# Patient Record
Sex: Male | Born: 1955 | Hispanic: No | Smoking: Never smoker
Health system: Southern US, Community
[De-identification: ages and names within clinical notes are randomized; demographics above are authoritative.]

## PROBLEM LIST (undated history)

## (undated) DIAGNOSIS — F1010 Alcohol abuse, uncomplicated: Secondary | ICD-10-CM

## (undated) DIAGNOSIS — E119 Type 2 diabetes mellitus without complications: Secondary | ICD-10-CM

## (undated) DIAGNOSIS — I10 Essential (primary) hypertension: Secondary | ICD-10-CM

## (undated) DIAGNOSIS — M869 Osteomyelitis, unspecified: Secondary | ICD-10-CM

## (undated) HISTORY — PX: OTHER SURGICAL HISTORY: SHX169

---

## 2000-02-24 ENCOUNTER — Emergency Department (HOSPITAL_COMMUNITY): Admission: EM | Admit: 2000-02-24 | Discharge: 2000-02-25 | Payer: Self-pay | Admitting: Emergency Medicine

## 2000-02-24 ENCOUNTER — Encounter: Payer: Self-pay | Admitting: Emergency Medicine

## 2001-03-07 ENCOUNTER — Encounter: Payer: Self-pay | Admitting: Emergency Medicine

## 2001-03-07 ENCOUNTER — Emergency Department (HOSPITAL_COMMUNITY): Admission: EM | Admit: 2001-03-07 | Discharge: 2001-03-07 | Payer: Self-pay | Admitting: Emergency Medicine

## 2002-07-11 ENCOUNTER — Emergency Department (HOSPITAL_COMMUNITY): Admission: EM | Admit: 2002-07-11 | Discharge: 2002-07-11 | Payer: Self-pay

## 2002-07-11 ENCOUNTER — Encounter: Payer: Self-pay | Admitting: Emergency Medicine

## 2016-08-22 DIAGNOSIS — M869 Osteomyelitis, unspecified: Secondary | ICD-10-CM

## 2016-08-22 DIAGNOSIS — E11621 Type 2 diabetes mellitus with foot ulcer: Secondary | ICD-10-CM

## 2016-08-22 DIAGNOSIS — I96 Gangrene, not elsewhere classified: Secondary | ICD-10-CM

## 2016-08-22 DIAGNOSIS — L03115 Cellulitis of right lower limb: Secondary | ICD-10-CM

## 2016-08-22 DIAGNOSIS — M79671 Pain in right foot: Secondary | ICD-10-CM

## 2016-08-22 DIAGNOSIS — E118 Type 2 diabetes mellitus with unspecified complications: Secondary | ICD-10-CM

## 2016-08-23 DIAGNOSIS — E11621 Type 2 diabetes mellitus with foot ulcer: Secondary | ICD-10-CM

## 2016-08-23 DIAGNOSIS — M869 Osteomyelitis, unspecified: Secondary | ICD-10-CM

## 2016-08-24 DIAGNOSIS — L97502 Non-pressure chronic ulcer of other part of unspecified foot with fat layer exposed: Secondary | ICD-10-CM

## 2016-08-24 DIAGNOSIS — I10 Essential (primary) hypertension: Secondary | ICD-10-CM

## 2016-08-24 DIAGNOSIS — L03115 Cellulitis of right lower limb: Secondary | ICD-10-CM

## 2016-08-26 DIAGNOSIS — M869 Osteomyelitis, unspecified: Secondary | ICD-10-CM

## 2016-09-02 ENCOUNTER — Encounter: Payer: Self-pay | Admitting: Sports Medicine

## 2016-09-08 ENCOUNTER — Ambulatory Visit: Payer: Self-pay | Admitting: Sports Medicine

## 2016-09-27 ENCOUNTER — Emergency Department (HOSPITAL_COMMUNITY)
Admission: EM | Admit: 2016-09-27 | Discharge: 2016-09-27 | Disposition: A | Discharge status: Home / Self Care | Payer: Self-pay | Attending: Emergency Medicine | Admitting: Emergency Medicine

## 2016-09-27 ENCOUNTER — Emergency Department (HOSPITAL_COMMUNITY): Payer: Self-pay

## 2016-09-27 DIAGNOSIS — E1165 Type 2 diabetes mellitus with hyperglycemia: Secondary | ICD-10-CM | POA: Insufficient documentation

## 2016-09-27 DIAGNOSIS — Z9114 Patient's other noncompliance with medication regimen: Secondary | ICD-10-CM | POA: Insufficient documentation

## 2016-09-27 DIAGNOSIS — Z760 Encounter for issue of repeat prescription: Secondary | ICD-10-CM | POA: Insufficient documentation

## 2016-09-27 DIAGNOSIS — T8130XA Disruption of wound, unspecified, initial encounter: Secondary | ICD-10-CM | POA: Insufficient documentation

## 2016-09-27 DIAGNOSIS — Y658 Other specified misadventures during surgical and medical care: Secondary | ICD-10-CM | POA: Insufficient documentation

## 2016-09-27 LAB — COMPREHENSIVE METABOLIC PANEL
ALBUMIN: 3.6 g/dL (ref 3.5–5.0)
ALK PHOS: 118 U/L (ref 38–126)
ALT: 19 U/L (ref 17–63)
AST: 18 U/L (ref 15–41)
Anion gap: 9 (ref 5–15)
BILIRUBIN TOTAL: 0.7 mg/dL (ref 0.3–1.2)
BUN: 14 mg/dL (ref 6–20)
CALCIUM: 8.9 mg/dL (ref 8.9–10.3)
CO2: 26 mmol/L (ref 22–32)
CREATININE: 0.91 mg/dL (ref 0.61–1.24)
Chloride: 98 mmol/L — ABNORMAL LOW (ref 101–111)
GFR calc Af Amer: >60 mL/min (ref >60)
GFR calc non Af Amer: >60 mL/min (ref >60)
GLUCOSE: 478 mg/dL — AB (ref 65–99)
Potassium: 4.4 mmol/L (ref 3.5–5.1)
Sodium: 133 mmol/L — ABNORMAL LOW (ref 135–145)
TOTAL PROTEIN: 7 g/dL (ref 6.5–8.1)

## 2016-09-27 LAB — CBC WITH DIFFERENTIAL/PLATELET
BASOS ABS: 0 10*3/uL (ref 0.0–0.1)
BASOS PCT: 1 %
EOS PCT: 5 %
Eosinophils Absolute: 0.3 10*3/uL (ref 0.0–0.7)
HCT: 40 % (ref 39.0–52.0)
Hemoglobin: 14.3 g/dL (ref 13.0–17.0)
Lymphocytes Relative: 26 %
Lymphs Abs: 1.4 10*3/uL (ref 0.7–4.0)
MCH: 30.7 pg (ref 26.0–34.0)
MCHC: 35.8 g/dL (ref 30.0–36.0)
MCV: 85.8 fL (ref 78.0–100.0)
MONO ABS: 0.4 10*3/uL (ref 0.1–1.0)
Monocytes Relative: 6 %
Neutro Abs: 3.5 10*3/uL (ref 1.7–7.7)
Neutrophils Relative %: 62 %
PLATELETS: 203 10*3/uL (ref 150–400)
RBC: 4.66 MIL/uL (ref 4.22–5.81)
RDW: 12.1 % (ref 11.5–15.5)
WBC: 5.6 10*3/uL (ref 4.0–10.5)

## 2016-09-27 LAB — I-STAT CG4 LACTIC ACID, ED: Lactic Acid, Venous: 2 mmol/L (ref 0.5–1.9)

## 2016-09-27 LAB — CBG MONITORING, ED
GLUCOSE-CAPILLARY: 151 mg/dL — AB (ref 65–99)
Glucose-Capillary: 474 mg/dL — ABNORMAL HIGH (ref 65–99)

## 2016-09-27 MED ORDER — MORPHINE SULFATE (PF) 4 MG/ML IV SOLN
4.0000 mg | Freq: Once | INTRAVENOUS | Status: AC
  Administered 2016-09-27: 4 mg via INTRAVENOUS
  Filled 2016-09-27: qty 1

## 2016-09-27 MED ORDER — SULFAMETHOXAZOLE-TRIMETHOPRIM 800-160 MG PO TABS
1.0000 | ORAL_TABLET | Freq: Once | ORAL | Status: AC
Start: 2016-09-27 — End: 2016-09-27
  Administered 2016-09-27: 1 via ORAL
  Filled 2016-09-27: qty 1

## 2016-09-27 MED ORDER — SULFAMETHOXAZOLE-TRIMETHOPRIM 800-160 MG PO TABS: 1.0000 | ORAL_TABLET | Freq: Two times a day (BID) | ORAL | 0 refills | Status: AC

## 2016-09-27 MED ORDER — INSULIN ASPART 100 UNIT/ML ~~LOC~~ SOLN
10.0000 [IU] | Freq: Once | SUBCUTANEOUS | Status: AC
  Administered 2016-09-27: 10 [IU] via INTRAVENOUS
  Filled 2016-09-27: qty 1

## 2016-09-27 MED ORDER — ONDANSETRON HCL 4 MG/2ML IJ SOLN
4.0000 mg | Freq: Once | INTRAMUSCULAR | Status: AC
  Administered 2016-09-27: 4 mg via INTRAVENOUS
  Filled 2016-09-27: qty 2

## 2016-09-27 MED ORDER — SODIUM CHLORIDE 0.9 % IV BOLUS (SEPSIS)
1000.0000 mL | Freq: Once | INTRAVENOUS | Status: AC
  Administered 2016-09-27: 1000 mL via INTRAVENOUS

## 2016-09-27 MED ORDER — IBUPROFEN 600 MG PO TABS: 600.0000 mg | ORAL_TABLET | Freq: Four times a day (QID) | ORAL | 0 refills | Status: DC | PRN

## 2016-09-27 MED ORDER — METFORMIN HCL 1000 MG PO TABS: 1000.0000 mg | ORAL_TABLET | Freq: Two times a day (BID) | ORAL | 0 refills | Status: DC

## 2016-09-27 NOTE — ED Notes (Signed)
CBG 474 in triage.

## 2016-09-27 NOTE — ED Triage Notes (Signed)
Patient states he had surgery his right foot around July 1st this year. He thinks it may be infected. He states he was unable to return for his post op appointment due to the expense.

## 2016-09-27 NOTE — ED Provider Notes (Signed)
[facility]-EMERGENCY DEPT Provider Note   CSN: [id number] Arrival date & time: 09/27/16  1246     History   Chief Complaint Chief Complaint  Patient presents with  . Post-op Problem    HPI Ryan Mcbride is a 61 y.o. male.  Pt presents to the ED today with right foot pain.  The pt had surgery at [facility] on July 1st to his right foot.  He does not know who did the surgery or why.  He does not have insurance, so could not afford the copay due to the expense.  He said that he's had some redness and drainage from his foot for the past few days.  The pt denies any f/c.  He does have a hx of diabetes and reports that his blood sugars have been elevated because he does not have his diabetic medication.  He said he does not live in [place] any more b/c he was kicked out of his apartment b/c he could not pay the rent.  He has not been able to work since the surgery.  He is living with his daughter here.        No past medical history on file. Dm 2 There are no active problems to display for this patient.   No past surgical history on file.  right foot I and d with removal of bone (08/23/16) by Dr. Marylene Land. 3rd toe amputation  Home Medications    Prior to Admission medications   Medication Sig Start Date End Date Taking? Authorizing Provider  amoxicillin-clavulanate (AUGMENTIN) 875-125 MG tablet Take 1 tablet by mouth 2 (two) times daily.    [provider]  ibuprofen (ADVIL,MOTRIN) 600 MG tablet Take 1 tablet (600 mg total) by mouth every 6 (six) hours as needed. 09/27/16   Jacalyn Lefevre, MD  metFORMIN (GLUCOPHAGE) 1000 MG tablet Take 1 tablet (1,000 mg total) by mouth 2 (two) times daily with a meal. 09/27/16   Jacalyn Lefevre, MD  sulfamethoxazole-trimethoprim (BACTRIM DS,SEPTRA DS) 800-160 MG tablet Take 1 tablet by mouth 2 (two) times daily. 09/27/16 10/04/16  Jacalyn Lefevre, MD    Family History No family history on file.  Social History Social History  Substance Use  Topics  . Smoking status: Not on file  . Smokeless tobacco: Not on file  . Alcohol use Not on file     Allergies   Patient has no allergy information on record.   Review of Systems Review of Systems  Musculoskeletal:       Right foot pain  Skin: Positive for wound.  All other systems reviewed and are negative.    Physical Exam Updated Vital Signs There were no vitals taken for this visit.  Physical Exam  Constitutional: He is oriented to person, place, and time. He appears well-developed and well-nourished.  HENT:  Head: Normocephalic and atraumatic.  Right Ear: External ear normal.  Left Ear: External ear normal.  Nose: Nose normal.  Mouth/Throat: Oropharynx is clear and moist.  Eyes: Pupils are equal, round, and reactive to light. Conjunctivae and EOM are normal.  Neck: Normal range of motion. Neck supple.  Cardiovascular: Normal rate, regular rhythm, normal heart sounds and intact distal pulses.   Pulmonary/Chest: Effort normal and breath sounds normal.  Abdominal: Soft. Bowel sounds are normal.  Musculoskeletal: Normal range of motion.  Neurological: He is alert and oriented to person, place, and time.  Skin:  See picture of right foot (below)  Nursing note and vitals reviewed.    ED  Treatments / Results  Labs (all labs ordered are listed, but only abnormal results are displayed) Labs Reviewed  COMPREHENSIVE METABOLIC PANEL - Abnormal; Notable for the following:       Result Value   Sodium 133 (*)    Chloride 98 (*)    Glucose, Bld 478 (*)    All other components within normal limits  CBG MONITORING, ED - Abnormal; Notable for the following:    Glucose-Capillary 474 (*)    All other components within normal limits  I-STAT CG4 LACTIC ACID, ED - Abnormal; Notable for the following:    Lactic Acid, Venous 2.00 (*)    All other components within normal limits  CBG MONITORING, ED - Abnormal; Notable for the following:    Glucose-Capillary 151 (*)    All  other components within normal limits  CBC WITH DIFFERENTIAL/PLATELET    EKG  EKG Interpretation None       Radiology Dg Foot Complete Right  Result Date: 09/27/2016 CLINICAL DATA:  Open wound along the medial side of the right forefoot. Recent surgery involving the first MTP joint on 08/22/2016. EXAM: RIGHT FOOT COMPLETE - 3+ VIEW COMPARISON:  None. FINDINGS: Evidence of recent amputation involving distal half of the first metatarsal. Exuberant heterotopic ossification noted. No obvious destructive bony changes to suggest osteomyelitis. The sesamoid bones of the great toe appear to be intact. Severe chronic appearing posttraumatic or post infectious changes involving the second and third metatarsals and second proximal phalanx. The third toe has been previously amputated. No obvious acute destructive bony changes are identified. There is a large calcaneal heel spur noted along with os trigonum and extensive small vessel calcifications. IMPRESSION: 1. Surgical changes involving the first metatarsal as discussed above. There is exuberant heterotopic ossification but no obvious plain film findings for osteomyelitis. 2. Remote posttraumatic or post infectious changes involving the second proximal phalanx and second metatarsal and distal third metatarsal. The third toe has been amputated. 3. Open wound along the medial aspect of the forefoot and lateral forefoot soft tissue swelling. Electronically Signed   By: Rudie Meyer M.D.   On: 09/27/2016 14:21    Procedures Procedures (including critical care time)  Medications Ordered in ED Medications  sulfamethoxazole-trimethoprim (BACTRIM DS,SEPTRA DS) 800-160 MG per tablet 1 tablet (not administered)  sodium chloride 0.9 % bolus 1,000 mL (1,000 mLs Intravenous New Bag/Given 09/27/16 1439)  morphine 4 MG/ML injection 4 mg (4 mg Intravenous Given 09/27/16 1439)  ondansetron (ZOFRAN) injection 4 mg (4 mg Intravenous Given 09/27/16 1439)  insulin aspart  (novoLOG) injection 10 Units (10 Units Intravenous Given 09/27/16 1439)     Initial Impression / Assessment and Plan / ED Course  I have reviewed the triage vital signs and the nursing notes.  Pertinent labs & imaging results that were available during my care of the patient were reviewed by me and considered in my medical decision making (see chart for details).  Records from [facility] reviewed.  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  Notes said Dr. Marylene Land did have clean margins from osteo on amputation.     The wound appears to have some dehiscence with some mild drainage.  No cellulitis.  No fevers.  No elevation in lactic or wbc.    Pt is stable for d/c.  He is encouraged to f/u with Dr. Marylene Land.  He has been on augmentin, so I will put him on bactrim.  He is also given the number to [facility] to f/u with diabetic care.  He knows to return if worse. Final Clinical Impressions(s) / ED Diagnoses   Final diagnoses:  Wound dehiscence  Poorly controlled type 2 diabetes mellitus (HCC)  Medication refill    New Prescriptions New Prescriptions   IBUPROFEN (ADVIL,MOTRIN) 600 MG TABLET    Take 1 tablet (600 mg total) by mouth every 6 (six) hours as needed.   SULFAMETHOXAZOLE-TRIMETHOPRIM (BACTRIM DS,SEPTRA DS) 800-160 MG TABLET    Take 1 tablet by mouth 2 (two) times daily.     Jacalyn LefevreHaviland, Lakyla Biswas, MD 09/27/16 [phone]

## 2016-12-04 DIAGNOSIS — E1165 Type 2 diabetes mellitus with hyperglycemia: Secondary | ICD-10-CM

## 2016-12-04 DIAGNOSIS — E11621 Type 2 diabetes mellitus with foot ulcer: Secondary | ICD-10-CM

## 2017-04-05 DIAGNOSIS — M869 Osteomyelitis, unspecified: Secondary | ICD-10-CM

## 2017-04-05 DIAGNOSIS — E1165 Type 2 diabetes mellitus with hyperglycemia: Secondary | ICD-10-CM

## 2017-04-05 DIAGNOSIS — L03115 Cellulitis of right lower limb: Secondary | ICD-10-CM

## 2017-04-06 DIAGNOSIS — E11628 Type 2 diabetes mellitus with other skin complications: Secondary | ICD-10-CM

## 2017-04-06 DIAGNOSIS — L03115 Cellulitis of right lower limb: Secondary | ICD-10-CM

## 2017-04-06 DIAGNOSIS — E11621 Type 2 diabetes mellitus with foot ulcer: Secondary | ICD-10-CM

## 2017-04-06 DIAGNOSIS — E1165 Type 2 diabetes mellitus with hyperglycemia: Secondary | ICD-10-CM

## 2017-04-07 DIAGNOSIS — M86671 Other chronic osteomyelitis, right ankle and foot: Secondary | ICD-10-CM

## 2017-04-07 DIAGNOSIS — E11621 Type 2 diabetes mellitus with foot ulcer: Secondary | ICD-10-CM

## 2017-04-07 DIAGNOSIS — L03115 Cellulitis of right lower limb: Secondary | ICD-10-CM

## 2017-04-08 ENCOUNTER — Encounter: Payer: Self-pay | Admitting: Sports Medicine

## 2017-04-22 ENCOUNTER — Ambulatory Visit (INDEPENDENT_AMBULATORY_CARE_PROVIDER_SITE_OTHER): Payer: Self-pay

## 2017-04-22 ENCOUNTER — Ambulatory Visit (INDEPENDENT_AMBULATORY_CARE_PROVIDER_SITE_OTHER): Payer: Self-pay | Admitting: Sports Medicine

## 2017-04-22 ENCOUNTER — Encounter: Payer: Self-pay | Admitting: Sports Medicine

## 2017-04-22 VITALS — BP 134/85 | HR 69 | Ht 65.0 in | Wt 145.0 lb

## 2017-04-22 DIAGNOSIS — E1142 Type 2 diabetes mellitus with diabetic polyneuropathy: Secondary | ICD-10-CM

## 2017-04-22 DIAGNOSIS — Z89421 Acquired absence of other right toe(s): Secondary | ICD-10-CM

## 2017-04-22 NOTE — Progress Notes (Signed)
Subjective: Ryan Mcbride is a 62 y.o. Diabetic male patient seen today in office for POV #1 (DOS 04-07-17), S/P Right 5th toe + distal metatarsal amputation secondary to osteomyelitis. Patient denies pain at surgical site, denies calf pain, denies headache, chest pain, shortness of breath, nausea, vomiting, fever, or chills. Patient states that he is doing well and is also following up at the wound care center every Friday. No other issues noted.   Patient is assisted by daughter who he lives with now in [place].  There are no active problems to display for this patient.   Current Outpatient Medications on File Prior to Visit  Medication Sig Dispense Refill  . amoxicillin-clavulanate (AUGMENTIN) 875-125 MG tablet Take 1 tablet by mouth 2 (two) times daily.    Marland Kitchen glyBURIDE (DIABETA) 5 MG tablet Take 5 mg by mouth daily with breakfast.    . lisinopril (PRINIVIL,ZESTRIL) 20 MG tablet Take 20 mg by mouth daily.    . metFORMIN (GLUCOPHAGE) 1000 MG tablet Take 1 tablet (1,000 mg total) by mouth 2 (two) times daily with a meal. 60 tablet 0  . ibuprofen (ADVIL,MOTRIN) 600 MG tablet Take 1 tablet (600 mg total) by mouth every 6 (six) hours as needed. (Patient not taking: Reported on 04/22/2017) 30 tablet 0   No current facility-administered medications on file prior to visit.     No Known Allergies  Objective: Vitals:   04/22/17 1003  Weight: 145 lb (65.8 kg)  Height: 5\' 5"  (1.651 m)    General: No acute distress, AAOx3  Right foot: Sutures intact at the proximal aspect of the amputation site with no gapping or dehiscence at surgical site, there is an open amputation wound that is completely granular that measures 3 cm x 2.6 cm and tunnels at the proximal aspect of the wound 0.5 cm all with granular tissue no deep tissue or deep structures exposed, mild bloody drainage, no erythema, no warmth,  no signs of infection noted, Capillary fill time <3 seconds in all mania digits, gross sensation present via  light touch to right foot however protective sensation absent.  No pain or crepitation with range of motion right foot.  Patient is status post right fifth toe plus distal metatarsal amputation and previous removal of bone at the first metatarsal due to history of osteomyelitis.  No pain with calf compression.   Post Op Xray, right foot consistent with postoperative status and chronic reactive bone changes due to history of previous osteomyelitis and previous surgery  Assessment and Plan:  Problem List Items Addressed This Visit    None    Visit Diagnoses    Status post amputation of lesser toe of right foot (HCC)    -  Primary   Relevant Orders   DG Foot Complete Right   Diabetic polyneuropathy associated with type 2 diabetes mellitus (HCC)       Relevant Medications   lisinopril (PRINIVIL,ZESTRIL) 20 MG tablet   glyBURIDE (DIABETA) 5 MG tablet        -Patient seen and evaluated -X-rays reviewed -[employer] and a small amount of iodoform packing dry sterile dressing to surgical site right foot secured with ACE wrap and stockinet  -Advised patient to make sure to keep dressings clean, dry, and intact to right foot continue with follow-up weekly at wound care center and assistance from daughter with changing the dressings -Advised patient to continue with post-op shoe oon right -Advised patient to limit activity to necessity  -Will plan for wound  check at next office visit. In the meantime, patient to call office if any issues or problems arise.   Asencion Islamitorya Minna Dumire, DPM

## 2017-05-27 ENCOUNTER — Ambulatory Visit: Payer: Self-pay | Admitting: Sports Medicine

## 2017-06-17 ENCOUNTER — Encounter: Payer: Self-pay | Admitting: Sports Medicine

## 2017-06-17 ENCOUNTER — Ambulatory Visit (INDEPENDENT_AMBULATORY_CARE_PROVIDER_SITE_OTHER): Payer: Self-pay | Admitting: Sports Medicine

## 2017-06-17 ENCOUNTER — Encounter: Payer: Self-pay | Admitting: *Deleted

## 2017-06-17 DIAGNOSIS — E1142 Type 2 diabetes mellitus with diabetic polyneuropathy: Secondary | ICD-10-CM

## 2017-06-17 DIAGNOSIS — Z89421 Acquired absence of other right toe(s): Secondary | ICD-10-CM

## 2017-06-17 NOTE — Progress Notes (Signed)
Subjective: Ryan Mcbride is a 62 y.o. Diabetic male patient seen today in office for POV #2 (DOS 04-07-17), S/P Right 5th toe + distal metatarsal amputation secondary to osteomyelitis. Patient denies pain at surgical site, was discharged from wound care center on Friday because the wound had healed, denies calf pain, denies headache, chest pain, shortness of breath, nausea, vomiting, fever, or chills.  No other issues noted.   Patient is living with daughter in [place].  There are no active problems to display for this patient.   Current Outpatient Medications on File Prior to Visit  Medication Sig Dispense Refill  . amoxicillin-clavulanate (AUGMENTIN) 875-125 MG tablet Take 1 tablet by mouth 2 (two) times daily.    Marland Kitchen. ibuprofen (ADVIL,MOTRIN) 600 MG tablet Take 1 tablet (600 mg total) by mouth every 6 (six) hours as needed. 30 tablet 0  . lisinopril (PRINIVIL,ZESTRIL) 20 MG tablet Take 20 mg by mouth daily.     No current facility-administered medications on file prior to visit.     No Known Allergies  Objective: There were no vitals filed for this visit.  General: No acute distress, AAOx3  Right foot: Amputation site wound healed, no erythema, no warmth,  no signs of infection noted, Capillary fill time <3 seconds in all remaining digits, gross sensation present via light touch to right foot however protective sensation absent.  No pain or crepitation with range of motion right foot.  Patient is status post right fifth toe plus distal metatarsal amputation and previous removal of bone at the first metatarsal due to history of osteomyelitis.  No pain with calf compression.  Assessment and Plan:  Problem List Items Addressed This Visit    None    Visit Diagnoses    Status post amputation of lesser toe of right foot (HCC)    -  Primary   Diabetic polyneuropathy associated with type 2 diabetes mellitus (HCC)            -Patient seen and evaluated -Recommend protective Band-Aid when issue  to prevent rubbing and re-ulceration -Patient may use good supportive tennis shoe -Work note given to return to work with no restrictions -Patient is healed and is discharged from postoperative care.  Return as needed and in the meantime, patient to call office if any issues or problems arise.   Ryan Mcbride, DPM

## 2017-06-18 ENCOUNTER — Emergency Department (HOSPITAL_COMMUNITY): Payer: Self-pay

## 2017-06-18 ENCOUNTER — Encounter (HOSPITAL_COMMUNITY): Payer: Self-pay | Admitting: Emergency Medicine

## 2017-06-18 ENCOUNTER — Inpatient Hospital Stay (HOSPITAL_COMMUNITY)
Admission: EM | Admit: 2017-06-18 | Discharge: 2017-06-22 | DRG: 637 | Disposition: A | Discharge status: Home / Self Care | Payer: Self-pay | Attending: Internal Medicine | Admitting: Internal Medicine

## 2017-06-18 ENCOUNTER — Other Ambulatory Visit: Payer: Self-pay

## 2017-06-18 DIAGNOSIS — J9601 Acute respiratory failure with hypoxia: Secondary | ICD-10-CM | POA: Diagnosis present

## 2017-06-18 DIAGNOSIS — D696 Thrombocytopenia, unspecified: Secondary | ICD-10-CM | POA: Diagnosis present

## 2017-06-18 DIAGNOSIS — J9690 Respiratory failure, unspecified, unspecified whether with hypoxia or hypercapnia: Secondary | ICD-10-CM

## 2017-06-18 DIAGNOSIS — I959 Hypotension, unspecified: Secondary | ICD-10-CM | POA: Diagnosis not present

## 2017-06-18 DIAGNOSIS — G92 Toxic encephalopathy: Secondary | ICD-10-CM | POA: Diagnosis present

## 2017-06-18 DIAGNOSIS — R569 Unspecified convulsions: Secondary | ICD-10-CM

## 2017-06-18 DIAGNOSIS — I10 Essential (primary) hypertension: Secondary | ICD-10-CM | POA: Diagnosis present

## 2017-06-18 DIAGNOSIS — G4089 Other seizures: Secondary | ICD-10-CM | POA: Diagnosis present

## 2017-06-18 DIAGNOSIS — Z978 Presence of other specified devices: Secondary | ICD-10-CM

## 2017-06-18 DIAGNOSIS — E1110 Type 2 diabetes mellitus with ketoacidosis without coma: Principal | ICD-10-CM | POA: Diagnosis present

## 2017-06-18 DIAGNOSIS — Z9114 Patient's other noncompliance with medication regimen: Secondary | ICD-10-CM

## 2017-06-18 DIAGNOSIS — Z79899 Other long term (current) drug therapy: Secondary | ICD-10-CM

## 2017-06-18 DIAGNOSIS — E876 Hypokalemia: Secondary | ICD-10-CM | POA: Diagnosis present

## 2017-06-18 DIAGNOSIS — N179 Acute kidney failure, unspecified: Secondary | ICD-10-CM | POA: Diagnosis present

## 2017-06-18 DIAGNOSIS — E1010 Type 1 diabetes mellitus with ketoacidosis without coma: Secondary | ICD-10-CM

## 2017-06-18 LAB — CBG MONITORING, ED
Glucose-Capillary: 373 mg/dL — ABNORMAL HIGH (ref 65–99)
Glucose-Capillary: 377 mg/dL — ABNORMAL HIGH (ref 65–99)
Glucose-Capillary: 438 mg/dL — ABNORMAL HIGH (ref 65–99)

## 2017-06-18 LAB — CBC WITH DIFFERENTIAL/PLATELET
BASOS ABS: 0.1 10*3/uL (ref 0.0–0.1)
BASOS PCT: 1 %
EOS ABS: 0.1 10*3/uL (ref 0.0–0.7)
Eosinophils Relative: 1 %
HCT: 44.6 % (ref 39.0–52.0)
Hemoglobin: 15.4 g/dL (ref 13.0–17.0)
LYMPHS PCT: 38 %
Lymphs Abs: 4.1 10*3/uL — ABNORMAL HIGH (ref 0.7–4.0)
MCH: 29.5 pg (ref 26.0–34.0)
MCHC: 34.5 g/dL (ref 30.0–36.0)
MCV: 85.4 fL (ref 78.0–100.0)
MONO ABS: 0.9 10*3/uL (ref 0.1–1.0)
Monocytes Relative: 8 %
NEUTROS PCT: 52 %
Neutro Abs: 5.7 10*3/uL (ref 1.7–7.7)
PLATELETS: 225 10*3/uL (ref 150–400)
RBC: 5.22 MIL/uL (ref 4.22–5.81)
RDW: 12.7 % (ref 11.5–15.5)
WBC: 10.9 10*3/uL — AB (ref 4.0–10.5)

## 2017-06-18 LAB — COMPREHENSIVE METABOLIC PANEL
ALT: <5 U/L — ABNORMAL LOW (ref 17–63)
ANION GAP: 25 — AB (ref 5–15)
AST: 36 U/L (ref 15–41)
Albumin: 4 g/dL (ref 3.5–5.0)
Alkaline Phosphatase: 154 U/L — ABNORMAL HIGH (ref 38–126)
BILIRUBIN TOTAL: 0.7 mg/dL (ref 0.3–1.2)
BUN: 17 mg/dL (ref 6–20)
CHLORIDE: 99 mmol/L — AB (ref 101–111)
CO2: 9 mmol/L — ABNORMAL LOW (ref 22–32)
Calcium: 9.3 mg/dL (ref 8.9–10.3)
Creatinine, Ser: 1.32 mg/dL — ABNORMAL HIGH (ref 0.61–1.24)
GFR calc non Af Amer: 56 mL/min — ABNORMAL LOW (ref >60)
Glucose, Bld: 535 mg/dL (ref 65–99)
Potassium: 3.8 mmol/L (ref 3.5–5.1)
Sodium: 133 mmol/L — ABNORMAL LOW (ref 135–145)
TOTAL PROTEIN: 7.5 g/dL (ref 6.5–8.1)

## 2017-06-18 LAB — I-STAT VENOUS BLOOD GAS, ED
ACID-BASE DEFICIT: 20 mmol/L — AB (ref 0.0–2.0)
BICARBONATE: 9 mmol/L — AB (ref 20.0–28.0)
O2 SAT: 96 %
TCO2: 10 mmol/L — AB (ref 22–32)
pCO2, Ven: 29.7 mmHg — ABNORMAL LOW (ref 44.0–60.0)
pH, Ven: 7.091 — CL (ref 7.250–7.430)
pO2, Ven: 113 mmHg — ABNORMAL HIGH (ref 32.0–45.0)

## 2017-06-18 LAB — LIPASE, BLOOD: LIPASE: 29 U/L (ref 11–51)

## 2017-06-18 LAB — ETHANOL

## 2017-06-18 MED ORDER — LORAZEPAM 2 MG/ML IJ SOLN
2.0000 mg | Freq: Once | INTRAMUSCULAR | Status: AC
  Administered 2017-06-18: 2 mg via INTRAVENOUS

## 2017-06-18 MED ORDER — SODIUM CHLORIDE 0.9 % IV SOLN
INTRAVENOUS | Status: DC
  Administered 2017-06-19: 03:00:00 via INTRAVENOUS

## 2017-06-18 MED ORDER — DEXTROSE-NACL 5-0.45 % IV SOLN: INTRAVENOUS | Status: DC

## 2017-06-18 MED ORDER — SODIUM CHLORIDE 0.9 % IV SOLN
INTRAVENOUS | Status: DC
  Administered 2017-06-18: 3.8 [IU]/h via INTRAVENOUS
  Filled 2017-06-18: qty 1

## 2017-06-18 MED ORDER — DEXMEDETOMIDINE HCL IN NACL 400 MCG/100ML IV SOLN
0.4000 ug/kg/h | INTRAVENOUS | Status: DC
  Administered 2017-06-19 (×3): 1 ug/kg/h via INTRAVENOUS
  Administered 2017-06-19: 0.8 ug/kg/h via INTRAVENOUS
  Administered 2017-06-20: 1.2 ug/kg/h via INTRAVENOUS
  Filled 2017-06-18 (×5): qty 100

## 2017-06-18 MED ORDER — POTASSIUM CHLORIDE 10 MEQ/100ML IV SOLN
10.0000 meq | INTRAVENOUS | Status: AC
  Administered 2017-06-19 (×2): 10 meq via INTRAVENOUS
  Filled 2017-06-18 (×2): qty 100

## 2017-06-18 MED ORDER — LORAZEPAM 2 MG/ML IJ SOLN
INTRAMUSCULAR | Status: AC
  Administered 2017-06-18: 2 mg
  Filled 2017-06-18: qty 1

## 2017-06-18 MED ORDER — LORAZEPAM 2 MG/ML IJ SOLN
INTRAMUSCULAR | Status: AC
  Filled 2017-06-18: qty 1

## 2017-06-18 MED ORDER — SODIUM CHLORIDE 0.9 % IV SOLN: INTRAVENOUS | Status: DC

## 2017-06-18 MED ORDER — DEXMEDETOMIDINE HCL IN NACL 200 MCG/50ML IV SOLN
0.4000 ug/kg/h | INTRAVENOUS | Status: DC
Start: 2017-06-18 — End: 2017-06-18

## 2017-06-18 MED ORDER — SODIUM CHLORIDE 0.9 % IV BOLUS
3000.0000 mL | Freq: Once | INTRAVENOUS | Status: AC
  Administered 2017-06-19: 1000 mL via INTRAVENOUS

## 2017-06-18 MED ORDER — LEVETIRACETAM IN NACL 500 MG/100ML IV SOLN
500.0000 mg | Freq: Two times a day (BID) | INTRAVENOUS | Status: DC
  Administered 2017-06-18 – 2017-06-19 (×2): 500 mg via INTRAVENOUS
  Filled 2017-06-18 (×3): qty 100

## 2017-06-18 MED ORDER — INSULIN REGULAR HUMAN 100 UNIT/ML IJ SOLN
INTRAMUSCULAR | Status: DC
  Administered 2017-06-18: 9.4 [IU]/h via INTRAVENOUS

## 2017-06-18 MED ORDER — DEXTROSE-NACL 5-0.45 % IV SOLN
INTRAVENOUS | Status: DC
  Administered 2017-06-19: 01:00:00 via INTRAVENOUS

## 2017-06-18 MED ORDER — HEPARIN SODIUM (PORCINE) 5000 UNIT/ML IJ SOLN
5000.0000 [IU] | Freq: Three times a day (TID) | INTRAMUSCULAR | Status: DC
  Administered 2017-06-19 – 2017-06-22 (×8): 5000 [IU] via SUBCUTANEOUS
  Filled 2017-06-18 (×11): qty 1

## 2017-06-18 MED ORDER — SODIUM CHLORIDE 0.9 % IV BOLUS
1000.0000 mL | Freq: Once | INTRAVENOUS | Status: AC
  Administered 2017-06-18: 1000 mL via INTRAVENOUS

## 2017-06-18 MED ORDER — LORAZEPAM 2 MG/ML IJ SOLN: 2.0000 mg | INTRAMUSCULAR | Status: DC | PRN

## 2017-06-18 NOTE — ED Triage Notes (Signed)
Patient with seizures, has had 3 in the last hour, two in the presence of EMS.  Patient is incontinent of urine.  Patient's pupil are unequal and CBG of 535.  Patient arrived postictal and feels warm to touch.

## 2017-06-18 NOTE — ED Notes (Signed)
Patient resting quietly; will continue to monitor.

## 2017-06-18 NOTE — ED Notes (Signed)
Patient with tonic clonic movements in stretcher.  Dr Jeraldine Loots notified.  Med orders per Dr Jeraldine Loots.

## 2017-06-18 NOTE — ED Provider Notes (Addendum)
[facility] EMERGENCY DEPARTMENT Provider Note   CSN: [id number] Arrival date & time: 06/18/17  1902     History   Chief Complaint Chief Complaint  Patient presents with  . Seizures    HPI Ryan Mcbride is a 62 y.o. male.  HPI Patient presents after seizure. Patient is just finishing a seizure episode on my initial exam, has received Ativan, level 5 caveat secondary to mental status. Per report patient has no history of seizures, does have a history of diabetic issues. Reportedly the patient had one witnessed seizure at home, 2 in route with EMS, and to immediately after arrival to our facility.  Patient did not receive antiepileptics in route. No report of fall, trauma.   History reviewed. No pertinent past medical history.  There are no active problems to display for this patient.   History reviewed. No pertinent surgical history.      Home Medications    Prior to Admission medications   Medication Sig Start Date End Date Taking? Authorizing Provider  ibuprofen (ADVIL,MOTRIN) 600 MG tablet Take 1 tablet (600 mg total) by mouth every 6 (six) hours as needed. 09/27/16   Jacalyn Lefevre, MD  lisinopril (PRINIVIL,ZESTRIL) 20 MG tablet Take 20 mg by mouth daily.    [provider]    Family History No family history on file.  Social History Social History   Tobacco Use  . Smoking status: Never Smoker  . Smokeless tobacco: Never Used  Substance Use Topics  . Alcohol use: Not on file  . Drug use: Not on file     Allergies   Patient has no known allergies.   Review of Systems Review of Systems  Unable to perform ROS: Mental status change     Physical Exam Updated Vital Signs BP (!) 141/83 (BP Location: Right Arm)   Pulse (!) 103   Temp 98.5 F (36.9 C) (Rectal)   Resp (!) 25   Wt 65.8 kg (145 lb)   SpO2 98%   BMI 24.13 kg/m   Physical Exam  Constitutional: He appears well-developed. No distress.  HENT:  Head:  Normocephalic and atraumatic.  Eyes: Conjunctivae and EOM are normal.  Cardiovascular: Normal rate and regular rhythm.  Pulmonary/Chest: Effort normal. No stridor. No respiratory distress.  Abdominal: He exhibits no distension.  Musculoskeletal: He exhibits no edema.       Feet:  Neurological:  Postictal, minimally interactive  Skin: Skin is warm and dry.  Nursing note and vitals reviewed.    ED Treatments / Results  Labs (all labs ordered are listed, but only abnormal results are displayed) Labs Reviewed  COMPREHENSIVE METABOLIC PANEL - Abnormal; Notable for the following components:      Result Value   Sodium 133 (*)    Chloride 99 (*)    CO2 9 (*)    Glucose, Bld 535 (*)    Creatinine, Ser 1.32 (*)    ALT <5 (*)    Alkaline Phosphatase 154 (*)    GFR calc non Af Amer 56 (*)    Anion gap 25 (*)    All other components within normal limits  CBC WITH DIFFERENTIAL/PLATELET - Abnormal; Notable for the following components:   WBC 10.9 (*)    Lymphs Abs 4.1 (*)    All other components within normal limits  I-STAT VENOUS BLOOD GAS, ED - Abnormal; Notable for the following components:   pH, Ven 7.091 (*)    pCO2, Ven 29.7 (*)  pO2, Ven 113.0 (*)    Bicarbonate 9.0 (*)    TCO2 10 (*)    Acid-base deficit 20.0 (*)    All other components within normal limits  ETHANOL  LIPASE, BLOOD  CBG MONITORING, ED     Radiology Ct Head Wo Contrast  Result Date: 06/18/2017 CLINICAL DATA:  Seizures.  Incontinent of urine. EXAM: CT HEAD WITHOUT CONTRAST TECHNIQUE: Contiguous axial images were obtained from the base of the skull through the vertex without intravenous contrast. COMPARISON:  None. FINDINGS: Brain: Ventricles, cisterns and other CSF spaces are within normal. There is no mass, mass effect, shift of midline structures or acute hemorrhage. No evidence of acute infarction. Vascular: No hyperdense vessel or unexpected calcification. Skull: Possible subtle fracture along the  right-sided nasal bone which may be acute or chronic. Sinuses/Orbits: Minimal mucosal membrane thickening over the floor the left maxillary sinus. Orbits are normal. Mild opacification over the inferior right mastoid air cells. Other: None. IMPRESSION: No acute intracranial findings. Subtle fracture along the right-sided nasal bone which may be acute or chronic. Minimal chronic sinus inflammatory change. Electronically Signed   By: Elberta Fortis M.D.   On: 06/18/2017 20:35    Procedures Procedures (including critical care time)  Medications Ordered in ED Medications  dextrose 5 %-0.45 % sodium chloride infusion (has no administration in time range)  insulin regular (NOVOLIN R,HUMULIN R) 100 Units in sodium chloride 0.9 % 100 mL (1 Units/mL) infusion (has no administration in time range)  LORazepam (ATIVAN) 2 MG/ML injection (has no administration in time range)  LORazepam (ATIVAN) injection 2 mg (2 mg Intravenous Given 06/18/17 1925)  sodium chloride 0.9 % bolus 1,000 mL (1,000 mLs Intravenous New Bag/Given 06/18/17 1943)     Initial Impression / Assessment and Plan / ED Course  I have reviewed the triage vital signs and the nursing notes.  Pertinent labs & imaging results that were available during my care of the patient were reviewed by me and considered in my medical decision making (see chart for details).     Update: Patient calm, initial labs notable for hyperglycemia. Patient does have a history of poorly controlled diabetes.  8:55 PM Patient agitated, combative, not calming easily. No additional seizure activity, but the patient will require Ativan for his safety.  11:30 PM No additional seizure activity, but the patient is required additional boluses of Ativan for agitation. Glucose is diminishing. Discussed patient's case with our critical care colleagues for admission to the ICU.  Patient has received initial fluid resuscitation, but given concern for DKA with anion gap of  25, acidosis with a pH of 7.091, patient is starting insulin drip, will require admission to the stepdown unit for his DKA and new seizure activity.  No other clear etiology for his seizures, patient seemingly denies alcohol use, when he is interacting in a coma manner, though this is inconsistent.   Final Clinical Impressions(s) / ED Diagnoses  DKA Seizure  CRITICAL CARE Performed by: Gerhard Munch Total critical care time: 25 minutes Critical care time was exclusive of separately billable procedures and treating other patients. Critical care was necessary to treat or prevent imminent or life-threatening deterioration. Critical care was time spent personally by me on the following activities: development of treatment plan with patient and/or surrogate as well as nursing, discussions with consultants, evaluation of patient's response to treatment, examination of patient, obtaining history from patient or surrogate, ordering and performing treatments and interventions, ordering and review of laboratory studies, ordering and  review of radiographic studies, pulse oximetry and re-evaluation of patient's condition.    Gerhard Munch, MD 06/18/17 2332  Patient remains agitated in spite of multiple doses of Ativan, and per critical care request, will be intubated prior to transfer to the ICU.   INTUBATION Performed by: Gerhard Munch  Required items: required blood products, implants, devices, and special equipment available Patient identity confirmed: provided demographic data and hospital-assigned identification number Time out: Immediately prior to procedure a "time out" was called to verify the correct patient, procedure, equipment, support staff and site/side marked as required.  Indications: agitation, airway protection  Intubation method: Glidescope Laryngoscopy   Preoxygenation: BVM  Sedatives: 20Etomidate Paralytic: 100Succinylcholine  Tube Size: 7.5  cuffed  Post-procedure assessment: chest rise and ETCO2 monitor Breath sounds: equal and absent over the epigastrium Tube secured with: ETT holder Chest x-ray interpreted by radiologist and me.  Chest x-ray findings: endotracheal tube in appropriate position  Patient tolerated the procedure well with no immediate complications.       Gerhard Munch, MD 06/19/17 [phone]

## 2017-06-18 NOTE — ED Notes (Signed)
Patient awoke, confused, pulling lines and tubes; attempting to crawl out of bed. MD Jeraldine Loots at bedside.

## 2017-06-18 NOTE — ED Notes (Signed)
Date and time results received: 06/18/17 2016 (use smartphrase ".now" to insert current time)  Test: Glucose Critical Value: 535  Name of Provider Notified: Inetta Fermo, RN  Orders Received? Or Actions Taken?: Nothing at this time

## 2017-06-19 ENCOUNTER — Inpatient Hospital Stay (HOSPITAL_COMMUNITY): Payer: Self-pay

## 2017-06-19 ENCOUNTER — Other Ambulatory Visit: Payer: Self-pay

## 2017-06-19 DIAGNOSIS — R569 Unspecified convulsions: Secondary | ICD-10-CM

## 2017-06-19 DIAGNOSIS — R451 Restlessness and agitation: Secondary | ICD-10-CM

## 2017-06-19 DIAGNOSIS — E1010 Type 1 diabetes mellitus with ketoacidosis without coma: Secondary | ICD-10-CM

## 2017-06-19 DIAGNOSIS — J9600 Acute respiratory failure, unspecified whether with hypoxia or hypercapnia: Secondary | ICD-10-CM

## 2017-06-19 LAB — BASIC METABOLIC PANEL
Anion gap: 12 (ref 5–15)
Anion gap: 7 (ref 5–15)
Anion gap: 6 (ref 5–15)
BUN: 12 mg/dL (ref 6–20)
BUN: 13 mg/dL (ref 6–20)
BUN: 16 mg/dL (ref 6–20)
CHLORIDE: 105 mmol/L (ref 101–111)
CHLORIDE: 110 mmol/L (ref 101–111)
CHLORIDE: 112 mmol/L — AB (ref 101–111)
CO2: 21 mmol/L — ABNORMAL LOW (ref 22–32)
CO2: 22 mmol/L (ref 22–32)
CO2: 23 mmol/L (ref 22–32)
CREATININE: 0.78 mg/dL (ref 0.61–1.24)
Calcium: 9 mg/dL (ref 8.9–10.3)
Calcium: 7.8 mg/dL — ABNORMAL LOW (ref 8.9–10.3)
Calcium: 7.8 mg/dL — ABNORMAL LOW (ref 8.9–10.3)
Creatinine, Ser: 1.02 mg/dL (ref 0.61–1.24)
Creatinine, Ser: 0.8 mg/dL (ref 0.61–1.24)
GFR calc Af Amer: >60 mL/min (ref >60)
GFR calc Af Amer: >60 mL/min (ref >60)
GFR calc Af Amer: >60 mL/min (ref >60)
GFR calc non Af Amer: >60 mL/min (ref >60)
GFR calc non Af Amer: >60 mL/min (ref >60)
GFR calc non Af Amer: >60 mL/min (ref >60)
GLUCOSE: 113 mg/dL — AB (ref 65–99)
GLUCOSE: 289 mg/dL — AB (ref 65–99)
Glucose, Bld: 109 mg/dL — ABNORMAL HIGH (ref 65–99)
POTASSIUM: 3.9 mmol/L (ref 3.5–5.1)
POTASSIUM: 4.3 mmol/L (ref 3.5–5.1)
Potassium: 4.2 mmol/L (ref 3.5–5.1)
SODIUM: 139 mmol/L (ref 135–145)
Sodium: 138 mmol/L (ref 135–145)
Sodium: 141 mmol/L (ref 135–145)

## 2017-06-19 LAB — RAPID URINE DRUG SCREEN, HOSP PERFORMED
AMPHETAMINES: NOT DETECTED
BENZODIAZEPINES: POSITIVE — AB
Barbiturates: NOT DETECTED
COCAINE: NOT DETECTED
Opiates: NOT DETECTED
Tetrahydrocannabinol: NOT DETECTED

## 2017-06-19 LAB — GLUCOSE, CAPILLARY
GLUCOSE-CAPILLARY: 114 mg/dL — AB (ref 65–99)
GLUCOSE-CAPILLARY: 228 mg/dL — AB (ref 65–99)
GLUCOSE-CAPILLARY: 256 mg/dL — AB (ref 65–99)
GLUCOSE-CAPILLARY: 157 mg/dL — AB (ref 65–99)
GLUCOSE-CAPILLARY: 73 mg/dL (ref 65–99)
Glucose-Capillary: 93 mg/dL (ref 65–99)
Glucose-Capillary: 103 mg/dL — ABNORMAL HIGH (ref 65–99)
Glucose-Capillary: 127 mg/dL — ABNORMAL HIGH (ref 65–99)

## 2017-06-19 LAB — HEMOGLOBIN A1C
Hgb A1c MFr Bld: 10 % — ABNORMAL HIGH (ref 4.8–5.6)
MEAN PLASMA GLUCOSE: 240.3 mg/dL

## 2017-06-19 LAB — I-STAT ARTERIAL BLOOD GAS, ED
Acid-base deficit: 5 mmol/L — ABNORMAL HIGH (ref 0.0–2.0)
BICARBONATE: 20.9 mmol/L (ref 20.0–28.0)
O2 SAT: 100 %
PCO2 ART: 41.9 mmHg (ref 32.0–48.0)
PO2 ART: 412 mmHg — AB (ref 83.0–108.0)
Patient temperature: 98.6
TCO2: 22 mmol/L (ref 22–32)
pH, Arterial: 7.306 — ABNORMAL LOW (ref 7.350–7.450)

## 2017-06-19 LAB — PROCALCITONIN: PROCALCITONIN: 0.11 ng/mL

## 2017-06-19 LAB — URINALYSIS, ROUTINE W REFLEX MICROSCOPIC
BACTERIA UA: NONE SEEN
Bilirubin Urine: NEGATIVE
Hgb urine dipstick: NEGATIVE
KETONES UR: 5 mg/dL — AB
LEUKOCYTES UA: NEGATIVE
Nitrite: NEGATIVE
PROTEIN: NEGATIVE mg/dL
Specific Gravity, Urine: 1.026 (ref 1.005–1.030)
pH: 6 (ref 5.0–8.0)

## 2017-06-19 LAB — MRSA PCR SCREENING: MRSA BY PCR: NEGATIVE

## 2017-06-19 LAB — LACTIC ACID, PLASMA
LACTIC ACID, VENOUS: 2.9 mmol/L — AB (ref 0.5–1.9)
Lactic Acid, Venous: 2.5 mmol/L (ref 0.5–1.9)
Lactic Acid, Venous: 1.3 mmol/L (ref 0.5–1.9)

## 2017-06-19 LAB — HIV ANTIBODY (ROUTINE TESTING W REFLEX): HIV Screen 4th Generation wRfx: NONREACTIVE

## 2017-06-19 LAB — MAGNESIUM: Magnesium: 2.3 mg/dL (ref 1.7–2.4)

## 2017-06-19 LAB — TROPONIN I: Troponin I: <0.03 ng/mL (ref <0.03)

## 2017-06-19 LAB — CK: CK TOTAL: 178 U/L (ref 49–397)

## 2017-06-19 LAB — CBG MONITORING, ED: GLUCOSE-CAPILLARY: 205 mg/dL — AB (ref 65–99)

## 2017-06-19 LAB — PHOSPHORUS: Phosphorus: 3 mg/dL (ref 2.5–4.6)

## 2017-06-19 LAB — TRIGLYCERIDES
TRIGLYCERIDES: 1320 mg/dL — AB (ref <150)
TRIGLYCERIDES: 131 mg/dL (ref <150)

## 2017-06-19 LAB — BETA-HYDROXYBUTYRIC ACID: Beta-Hydroxybutyric Acid: 0.55 mmol/L — ABNORMAL HIGH (ref 0.05–0.27)

## 2017-06-19 MED ORDER — LEVETIRACETAM IN NACL 500 MG/100ML IV SOLN
500.0000 mg | Freq: Two times a day (BID) | INTRAVENOUS | Status: DC
  Administered 2017-06-19 – 2017-06-21 (×4): 500 mg via INTRAVENOUS
  Filled 2017-06-19 (×4): qty 100

## 2017-06-19 MED ORDER — LEVETIRACETAM IN NACL 1000 MG/100ML IV SOLN
1000.0000 mg | Freq: Two times a day (BID) | INTRAVENOUS | Status: DC
  Filled 2017-06-19: qty 100

## 2017-06-19 MED ORDER — FENTANYL BOLUS VIA INFUSION
50.0000 ug | INTRAVENOUS | Status: DC | PRN
  Administered 2017-06-20: 50 ug via INTRAVENOUS
  Filled 2017-06-19: qty 50

## 2017-06-19 MED ORDER — ETOMIDATE 2 MG/ML IV SOLN
INTRAVENOUS | Status: AC | PRN
  Administered 2017-06-19: 20 mg via INTRAVENOUS

## 2017-06-19 MED ORDER — PHENYLEPHRINE HCL 10 MG/ML IJ SOLN
0.0000 ug/min | INTRAMUSCULAR | Status: DC
  Administered 2017-06-19: 40 ug/min via INTRAVENOUS
  Filled 2017-06-19: qty 10

## 2017-06-19 MED ORDER — FENTANYL CITRATE (PF) 100 MCG/2ML IJ SOLN
50.0000 ug | Freq: Once | INTRAMUSCULAR | Status: AC
  Administered 2017-06-19: 50 ug via INTRAVENOUS

## 2017-06-19 MED ORDER — SODIUM CHLORIDE 0.9 % IV SOLN
INTRAVENOUS | Status: DC
  Administered 2017-06-19 – 2017-06-20 (×2): via INTRAVENOUS

## 2017-06-19 MED ORDER — PROPOFOL 1000 MG/100ML IV EMUL
0.0000 ug/kg/min | INTRAVENOUS | Status: DC
  Administered 2017-06-19 (×2): 50 ug/kg/min via INTRAVENOUS
  Administered 2017-06-19 (×2): 40 ug/kg/min via INTRAVENOUS
  Filled 2017-06-19 (×4): qty 100

## 2017-06-19 MED ORDER — DEXTROSE 50 % IV SOLN
INTRAVENOUS | Status: AC
  Administered 2017-06-19: 50 mL
  Filled 2017-06-19: qty 50

## 2017-06-19 MED ORDER — LACTATED RINGERS IV BOLUS
1000.0000 mL | Freq: Once | INTRAVENOUS | Status: AC
  Administered 2017-06-19: 1000 mL via INTRAVENOUS

## 2017-06-19 MED ORDER — LORAZEPAM 2 MG/ML IJ SOLN
2.0000 mg | Freq: Once | INTRAMUSCULAR | Status: AC
  Administered 2017-06-19: 2 mg via INTRAVENOUS

## 2017-06-19 MED ORDER — FOLIC ACID 5 MG/ML IJ SOLN
1.0000 mg | Freq: Every day | INTRAMUSCULAR | Status: DC
  Administered 2017-06-19: 1 mg via INTRAVENOUS
  Filled 2017-06-19 (×2): qty 0.2

## 2017-06-19 MED ORDER — CHLORHEXIDINE GLUCONATE 0.12% ORAL RINSE (MEDLINE KIT)
15.0000 mL | Freq: Two times a day (BID) | OROMUCOSAL | Status: DC
  Administered 2017-06-19 – 2017-06-20 (×3): 15 mL via OROMUCOSAL

## 2017-06-19 MED ORDER — LEVETIRACETAM IN NACL 500 MG/100ML IV SOLN: 500.0000 mg | Freq: Two times a day (BID) | INTRAVENOUS | Status: DC

## 2017-06-19 MED ORDER — ORAL CARE MOUTH RINSE
15.0000 mL | OROMUCOSAL | Status: DC
  Administered 2017-06-19 – 2017-06-20 (×11): 15 mL via OROMUCOSAL

## 2017-06-19 MED ORDER — MIDAZOLAM HCL 2 MG/2ML IJ SOLN
1.0000 mg | INTRAMUSCULAR | Status: DC | PRN
  Administered 2017-06-19: 2 mg via INTRAVENOUS
  Filled 2017-06-19 (×2): qty 2

## 2017-06-19 MED ORDER — LORAZEPAM 2 MG/ML IJ SOLN
INTRAMUSCULAR | Status: AC
  Filled 2017-06-19: qty 1

## 2017-06-19 MED ORDER — PROPOFOL 1000 MG/100ML IV EMUL
INTRAVENOUS | Status: AC
  Administered 2017-06-19: 40 ug/kg/min via INTRAVENOUS
  Filled 2017-06-19: qty 100

## 2017-06-19 MED ORDER — FENTANYL 2500MCG IN NS 250ML (10MCG/ML) PREMIX INFUSION
25.0000 ug/h | INTRAVENOUS | Status: DC
  Administered 2017-06-19: 100 ug/h via INTRAVENOUS
  Filled 2017-06-19: qty 250

## 2017-06-19 MED ORDER — LEVETIRACETAM IN NACL 500 MG/100ML IV SOLN
500.0000 mg | Freq: Once | INTRAVENOUS | Status: DC
  Filled 2017-06-19: qty 100

## 2017-06-19 MED ORDER — ADULT MULTIVITAMIN LIQUID CH
15.0000 mL | Freq: Every day | ORAL | Status: DC
  Administered 2017-06-19: 15 mL via ORAL
  Filled 2017-06-19 (×2): qty 15

## 2017-06-19 MED ORDER — INSULIN ASPART 100 UNIT/ML ~~LOC~~ SOLN
0.0000 [IU] | SUBCUTANEOUS | Status: DC
  Administered 2017-06-19: 5 [IU] via SUBCUTANEOUS
  Administered 2017-06-19: 2 [IU] via SUBCUTANEOUS
  Administered 2017-06-19: 8 [IU] via SUBCUTANEOUS
  Administered 2017-06-19: 3 [IU] via SUBCUTANEOUS
  Administered 2017-06-20: 2 [IU] via SUBCUTANEOUS
  Administered 2017-06-21: 3 [IU] via SUBCUTANEOUS
  Administered 2017-06-21: 5 [IU] via SUBCUTANEOUS

## 2017-06-19 MED ORDER — THIAMINE HCL 100 MG/ML IJ SOLN
100.0000 mg | Freq: Every day | INTRAMUSCULAR | Status: DC
  Administered 2017-06-19: 100 mg via INTRAVENOUS
  Filled 2017-06-19 (×2): qty 1

## 2017-06-19 MED ORDER — SUCCINYLCHOLINE CHLORIDE 20 MG/ML IJ SOLN
INTRAMUSCULAR | Status: AC | PRN
  Administered 2017-06-19: 100 mg via INTRAVENOUS

## 2017-06-19 MED ORDER — PANTOPRAZOLE SODIUM 40 MG PO PACK
40.0000 mg | PACK | Freq: Every day | ORAL | Status: DC
  Administered 2017-06-19 – 2017-06-20 (×2): 40 mg
  Filled 2017-06-19 (×3): qty 20

## 2017-06-19 NOTE — ED Notes (Signed)
At bedside: Kateri Mc Nurse, Janyth Contes, NP, Jeraldine Loots, MD, and RT

## 2017-06-19 NOTE — Consult Note (Signed)
NEURO HOSPITALIST CONSULT NOTE   Requestig physician: Dr. Jimmey Ralph  Reason for Consult: Seizures and AMS in the context of severe hyperglycemia  History obtained from:  Chart    HPI:                                                                                                                                          Ryan Mcbride is an 62 y.o. male who presented to the ED with 3 seizures in one hour, one witnessed at home and two in the presence of EMS. CBG was 535. Triage note states that patient's pupils were unequal. He was postictal on arrival and warm to touch. After arrival, the patient had a 4th seizure, with tonic-clonic movements in the stretcher. Ativan was administered. Keppra 1000 mg IV was then loaded. On awakening, the patient was confused, pulling lines and tubes and attempting to crawl out of bed. Precedex was administered and he was subsequently intubated. Neurology was consulted to further evaluate.   History reviewed. No pertinent past medical history.  History reviewed. No pertinent surgical history.  No family history on file.            Social History:  reports that he has never smoked. He has never used smokeless tobacco. His alcohol and drug histories are not on file.  No Known Allergies  MEDICATIONS:                                                                                                                     Prior to Admission:  Medications Prior to Admission  Medication Sig Dispense Refill Last Dose  . ibuprofen (ADVIL,MOTRIN) 600 MG tablet Take 1 tablet (600 mg total) by mouth every 6 (six) hours as needed. 30 tablet 0 Taking  . lisinopril (PRINIVIL,ZESTRIL) 20 MG tablet Take 20 mg by mouth daily.   Taking   Scheduled: . chlorhexidine gluconate (MEDLINE KIT)  15 mL Mouth Rinse BID  . folic acid  1 mg Intravenous Daily  . heparin  5,000 Units Subcutaneous Q8H  . insulin aspart  0-15 Units Subcutaneous Q4H  . mouth rinse  15 mL  Mouth Rinse 10 times per day  . multivitamin  15 mL Oral Daily  . pantoprazole sodium  40 mg Per Tube  Daily  . thiamine injection  100 mg Intravenous Daily   Continuous: . dexmedetomidine 1 mcg/kg/hr (06/19/17 0500)  . fentaNYL infusion INTRAVENOUS 100 mcg/hr (06/19/17 0500)  . levETIRAcetam Stopped (06/19/17 0036)  . propofol (DIPRIVAN) infusion 50 mcg/kg/min (06/19/17 0515)     ROS:                                                                                                                                       Unable to obtain due to intubation and sedation.   Blood pressure 98/72, pulse 70, temperature 99 F (37.2 C), temperature source Bladder, resp. rate (!) 23, height _0  (1.727 m), weight 68 kg (149 lb 14.6 oz), SpO2 99 %.   General Examination:                                                                                                      HEENT-  [place]/AT    Lungs- Intubated Extremities- No edema  Neurological Examination  Performed while sedated with Precedex, Fentanyl and propofol at a rate of 50 Mental Status: Intubated and sedated.  Cranial Nerves: II: Pupils equal and unreactive (sedated) III,IV, VI: Eyes at midline, no oculocephalic reflex (sedated with propofol).  V,VII: Face flaccidly symmetric VIII: Unable to assess IX,X: Intubated XI: Unable to assess XII: Intubated Motor/Sensory: Flaccid tone x 4 without movement to stimulation (sedated). Bulk normal.  Deep Tendon Reflexes: Absent throughout. Plantars mute (sedated) Cerebellar/Gait: Unable to assess   Lab Results: Basic Metabolic Panel: Recent Labs  Lab 06/18/17 1930 06/18/17 2335  NA 133* 138  K 3.8 3.9  CL 99* 105  CO2 9* 21*  GLUCOSE 535* 289*  BUN 17 16  CREATININE 1.32* 1.02  CALCIUM 9.3 9.0  MG  --  2.3  PHOS  --  3.0    CBC: Recent Labs  Lab 06/18/17 1930  WBC 10.9*  NEUTROABS 5.7  HGB 15.4  HCT 44.6  MCV 85.4  PLT 225    Cardiac Enzymes: Recent Labs  Lab  06/18/17 2335  TROPONINI <0.03    Lipid Panel: No results for input(s): CHOL, TRIG, HDL, CHOLHDL, VLDL, LDLCALC in the last 168 hours.  Imaging: Ct Head Wo Contrast  Result Date: 06/18/2017 CLINICAL DATA:  Seizures.  Incontinent of urine. EXAM: CT HEAD WITHOUT CONTRAST TECHNIQUE: Contiguous axial images were obtained from the base of the skull through the vertex without intravenous contrast. COMPARISON:  None. FINDINGS: Brain: Ventricles, cisterns and other CSF spaces are within  normal. There is no mass, mass effect, shift of midline structures or acute hemorrhage. No evidence of acute infarction. Vascular: No hyperdense vessel or unexpected calcification. Skull: Possible subtle fracture along the right-sided nasal bone which may be acute or chronic. Sinuses/Orbits: Minimal mucosal membrane thickening over the floor the left maxillary sinus. Orbits are normal. Mild opacification over the inferior right mastoid air cells. Other: None. IMPRESSION: No acute intracranial findings. Subtle fracture along the right-sided nasal bone which may be acute or chronic. Minimal chronic sinus inflammatory change. Electronically Signed   By: Marin Olp M.D.   On: 06/18/2017 20:35   Dg Chest Portable 1 View  Result Date: 06/19/2017 CLINICAL DATA:  Post intubation and gastric tube placement. EXAM: PORTABLE CHEST 1 VIEW COMPARISON:  06/18/2017 FINDINGS: Endotracheal tube tip is 2 cm above the carina. Gastric tube tip and side-port are seen coiled within the stomach, the tip near the expected location of the gastric fundus. Low lung volumes with atelectasis. Cardiomegaly is stable. There is mild aortic atherosclerosis. Retrocardiac infiltrate and/or atelectasis with air bronchograms noted at the left lung base. IMPRESSION: Satisfactory endotracheal and gastric tube positions. Low lung volumes with bibasilar atelectasis. Slightly more confluent airspace opacity with air bronchograms at the left lung base, suspicious for  pneumonia and/or atelectasis. Electronically Signed   By: Ashley Royalty M.D.   On: 06/19/2017 01:17   Dg Chest Port 1 View  Result Date: 06/18/2017 CLINICAL DATA:  ALOC, seizures. Patient with seizures, has had 3 in the last hour, two in the presence of EMS. Patient is incontinent of urine. Patient's pupil are unequal and CBG of 535. No known heart or lung problems. Non-smoker. EXAM: PORTABLE CHEST 1 VIEW COMPARISON:  12/20/2014 FINDINGS: Study somewhat limited by the supine portable technique and rotation of the patient to the right. Cardiac silhouette is top-normal in size to mildly enlarged. No convincing mediastinal or hilar masses. Lungs are clear.  No convincing pleural effusion.  No pneumothorax. Skeletal structures are grossly intact. IMPRESSION: No active disease. Electronically Signed   By: Lajean Manes M.D.   On: 06/18/2017 20:58    Assessment: 62 year old male with new onset seizure x 4 in the setting of severe hyperglycemia (CBG in 500's) 1. Examination compromised by deep sedation 2. Risk of recurrent seizure felt to be too high to justify stopping propofol for exam without EEG guidance 3. Most likely etiology for seizures felt to be severe hyperglycemia  Recommendations: 1. LTM EEG. If negative for electrographic seizures, continue at current dose of propofol for a total of 24 hours, then taper under EEG guidance (continue propofol at current dose until 1 AM Sunday, then call Neurology for tapering).  2. Continue Keppra at 500 mg IV BID (ordered). Consider tapering following extubation if EEG is negative off sedation.   3. Glucose and electrolyte management as per ICU team  40 minutes spent in the emergent neurological evaluation and management of this critically ill patient  Electronically signed: Dr. Kerney Elbe 06/19/2017, 2:00 AM

## 2017-06-19 NOTE — Progress Notes (Signed)
EEG reviewed. At 2015 there appears to be rhythmic medium amplitude delta activity in the left frontal and temporal leads, which was preceded by diffuse theta and occasional sleep spindles. These EEG findings appear most consistent with patient transitioning from a sleep state to a state of semi-arousal. This EEG change lasts for approximately 3.5 minutes and is again followed by theta activity diffusely, c/w a return to a sleep state. Similar findings are seen later in the record intermittently, accompanied by prominent muscle artifact.   The patient remains on Versed and propofol. Keppra dosing is at 500 mg IV BID.   A/R: 62 year old male presenting with status epilepticus. EEG review shows an encephalopathic state predominantly in sleep with some short periods of semi-arousal 1. No change to current plan  2. Will continue to monitor EEG.   Electronically signed: Dr. Caryl Pina

## 2017-06-19 NOTE — ED Notes (Signed)
Patient awoke, again, pulling at tubes, unable to follow directions. IV site in right arm infiltrated and MD made aware and at bedside. Order for Precedex to start. IV's 5 and 6 (because patient removed all other IV's) are in patient's left arm.

## 2017-06-19 NOTE — Consult Note (Signed)
WOC Nurse wound consult note Reason for Consult: Right lateral foot, site of previous toe amputation.  Dry eschar in place. Wound type: surgical, full thickness Pressure Injury POA: NA Measurement: 3.2cm x 1cm with no depth.  Dry eschar is firmly adherent. Wound bed:As described above Drainage (amount, consistency, odor) None Periwound: Intact, dry with no erythema, induration or warmth. Dressing procedure/placement/frequency: I have provided Nursing with guidance via the Orders for painting the affected area with a betadine swabstick and allowing to air dry, then covering with a dry dressing.  Bilateral Pressure redistribution heel boots are provided to off-load the heels in this patient at risk for heel breakdown.  WOC nursing team will not follow, but will remain available to this patient, the nursing and medical teams.  Please re-consult if needed. Thanks, Ladona Mow, MSN, RN, GNP, [facility]  Pager# [phone]

## 2017-06-19 NOTE — Progress Notes (Signed)
vLTM EEG running. Notified Neuro 

## 2017-06-19 NOTE — ED Notes (Signed)
Patient to be intubate; Jeraldine Loots MD at bedside.

## 2017-06-19 NOTE — ED Notes (Signed)
Report given to RN Dede PTA

## 2017-06-19 NOTE — Progress Notes (Signed)
Pt transported to 40M 16 without event.  Rt given report and will monitor.

## 2017-06-19 NOTE — H&P (Addendum)
PULMONARY / CRITICAL CARE MEDICINE   Name: Ryan Mcbride MRN: [id number] DOB: [birth year]    ADMISSION DATE:  06/18/2017 CONSULTATION DATE:  06/18/2017  REFERRING MD:  Dr. Jeraldine Loots   CHIEF COMPLAINT:  AMS/Seziures  HISTORY OF PRESENT ILLNESS:   62 year old male with H/O HTN, DM, and ETOH (Reportedly Quit in Feb 2019)  4/27 Patient presents to ED after witnessed seizure activity. Reportedly patient was at a party with a friend during this event. Per EMS patient had one seizure prior to there arrival and an additional two in the truck. On arrival to ED patient had forth seizure. Glucose 535. PH 7.091. Started on insulin gtt and given one liter fluid bolus. At 2155 patient combative, agitated, and pulling out lines. Placed in 4 point resistants. Given 2 mg ativan x 4 with minimal response. Placed on Precedex gtt. Patient continued to be combative, confused, and thrashing around. Intubated in ED. Neurology consulted. Given Keppra. PCCM asked to admit.   PAST MEDICAL HISTORY :  He  has no past medical history on file.  PAST SURGICAL HISTORY: He  has no past surgical history on file.  No Known Allergies  No current facility-administered medications on file prior to encounter.    Current Outpatient Medications on File Prior to Encounter  Medication Sig  . ibuprofen (ADVIL,MOTRIN) 600 MG tablet Take 1 tablet (600 mg total) by mouth every 6 (six) hours as needed.  Marland Kitchen lisinopril (PRINIVIL,ZESTRIL) 20 MG tablet Take 20 mg by mouth daily.    FAMILY HISTORY:  His has no family status information on file.    SOCIAL HISTORY: He  reports that he has never smoked. He has never used smokeless tobacco.  REVIEW OF SYSTEMS:   Unable to review as patient is intubated and sedated   SUBJECTIVE:   VITAL SIGNS: BP (!) 141/89   Pulse 78   Temp 98.5 F (36.9 C) (Rectal)   Resp 12   Wt 65.8 kg (145 lb)   SpO2 97%   BMI 24.13 kg/m   HEMODYNAMICS:    VENTILATOR SETTINGS: Vent Mode: PRVC FiO2  (%):  [100 %] 100 % Set Rate:  [18 bmp] 18 bmp Vt Set:  [480 mL] 480 mL PEEP:  [5 cmH20] 5 cmH20 Plateau Pressure:  [15 cmH20] 15 cmH20  INTAKE / OUTPUT: No intake/output data recorded.  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  Sepsis Markers Recent Labs  Lab 06/18/17 2335  LATICACIDVEN 2.9*    ABG Recent Labs  Lab 06/19/17 0114  PHART 7.306*  PCO2ART  41.9  PO2ART 412.0*    Liver Enzymes Recent Labs  Lab 06/18/17 1930  AST 36  ALT <5*  ALKPHOS 154*  BILITOT 0.7  ALBUMIN 4.0    Cardiac Enzymes Recent Labs  Lab 06/18/17 2335  TROPONINI <0.03    Glucose Recent Labs  Lab 06/18/17 2121 06/18/17 2227 06/18/17 2337 06/19/17 0048  GLUCAP 438* 377* 373* 205*    Imaging Ct Head Wo Contrast  Result Date: 06/18/2017 CLINICAL DATA:  Seizures.  Incontinent of urine. EXAM: CT HEAD WITHOUT CONTRAST TECHNIQUE: Contiguous axial images were obtained from the base of the skull through the vertex without intravenous contrast. COMPARISON:  None. FINDINGS: Brain: Ventricles, cisterns and other CSF spaces are within normal. There is no mass, mass effect, shift of midline structures or acute hemorrhage. No evidence of acute  infarction. Vascular: No hyperdense vessel or unexpected calcification. Skull: Possible subtle fracture along the right-sided nasal bone which may be acute or chronic. Sinuses/Orbits: Minimal mucosal membrane thickening over the floor the left maxillary sinus. Orbits are normal. Mild opacification over the inferior right mastoid air cells. Other: None. IMPRESSION: No acute intracranial findings. Subtle fracture along the right-sided nasal bone which may be acute or chronic. Minimal chronic sinus inflammatory change. Electronically Signed   By: Elberta Fortis M.D.   On: 06/18/2017 20:35   Dg Chest Portable 1 View  Result Date: 06/19/2017 CLINICAL DATA:  Post intubation and gastric tube placement. EXAM: PORTABLE CHEST 1 VIEW COMPARISON:  06/18/2017 FINDINGS: Endotracheal tube tip is 2 cm above the carina. Gastric tube tip and side-port are seen coiled within the stomach, the tip near the expected location of the gastric fundus. Low lung volumes with atelectasis. Cardiomegaly is stable. There is mild aortic atherosclerosis. Retrocardiac infiltrate and/or atelectasis with air bronchograms noted at the left lung base. IMPRESSION: Satisfactory endotracheal and gastric tube positions. Low lung volumes with bibasilar atelectasis. Slightly more confluent airspace opacity with air bronchograms at the left lung base, suspicious for pneumonia and/or atelectasis. Electronically Signed   By: Tollie Eth M.D.   On: 06/19/2017 01:17   Dg Chest Port 1 View  Result Date: 06/18/2017 CLINICAL DATA:  ALOC, seizures. Patient with seizures, has had 3 in the last hour, two in the presence of EMS. Patient is incontinent of urine. Patient's pupil are unequal and CBG of 535. No known heart or lung problems. Non-smoker. EXAM: PORTABLE CHEST 1 VIEW COMPARISON:  12/20/2014 FINDINGS: Study somewhat limited by the supine portable technique and rotation of the patient to the right. Cardiac silhouette is top-normal in size to mildly enlarged.  No convincing mediastinal or hilar masses. Lungs are clear.  No convincing pleural effusion.  No pneumothorax. Skeletal structures are grossly intact. IMPRESSION: No active disease. Electronically Signed   By: Amie Portland M.D.   On: 06/18/2017 20:58     STUDIES:  CT Head 4/27 > No acute intracranial findings. Subtle fracture along the right-sided nasal bone which may be acute or chronic. Minimal chronic sinus inflammatory change. CXR 4/27 > Study somewhat limited by the supine portable technique and rotation of the patient to the right. Cardiac silhouette is top-normal in size to mildly enlarged. No convincing mediastinal or hilar masses. Lungs are clear.  No convincing pleural effusion.  No pneumothorax. Skeletal structures are grossly intact.  CULTURES: Blood 4/27 >> U/A 4/27 >> Tracheal Asp 4/27 >>   ANTIBIOTICS: None.   SIGNIFICANT EVENTS: 4/27 > Presents to ED   LINES/TUBES: ETT 4/28 >>   DISCUSSION:  62 year old male presents post-ictal. Glucose 535. Remained encephalopathic requiring intubation.   ASSESSMENT / PLAN:  PULMONARY A: Respiratory Insufficieny in setting of encephalopathy  P:   Vent Support Trend ABG/CXR  Pulmonary Hygiene  VAP Bundle   CARDIOVASCULAR A:  H/O HTN  P:  Cardiac Monitoring  Maintain MAP > 65   RENAL A:   Anion Gap Metabolic Acidosis with Lactic Acidosis  Acute Kidney Injury > Improving  Crt 1.32 > 1.02  Hypokalemia  P:   Trend BMP  Replace electrolytes as indicated  Trend LA  Receiving 4 L NS total   GASTROINTESTINAL A:   SUP  P:   NPO  PPI   HEMATOLOGIC A:   No issues  P:  Trend CBC   INFECTIOUS A:   Afebrile, WBC 10.9  P:   Trend WBC and Fever Curve Trend LA and PCT  Follow Culture Data    ENDOCRINE A:   DKA    P:   DKA protocol   NEUROLOGIC A:   Encephalopathy > Concern for ETOH withdrawal vs drug use  Seizure Activity  H/O ETOH (Reportedly Quit in Feb 2019)  -ETOH <10  -UDS negative  P:    RASS goal: 0/-1 Neurology Consulted  Continue Keppra  EEG pending  Wean Propofol, Continue Precedex, Add Fentanyl gtt and titrate for RASS goal   FAMILY  - Updates: Family updated via phone by Dr. Merlene Pulling   - Inter-disciplinary family meet or Palliative Care meeting due by: 06/25/2017   Jovita Kussmaul, AGACNP-BC [facility]  Pgr: [phone]  PCCM Pgr: [phone]

## 2017-06-19 NOTE — Progress Notes (Signed)
PULMONARY / CRITICAL CARE MEDICINE   Name: Ryan Mcbride MRN: [id number] DOB: [birth year]    ADMISSION DATE:  06/18/2017 CONSULTATION DATE:  06/18/2017  REFERRING MD:  Dr. Jeraldine Loots   CHIEF COMPLAINT:  AMS/Seziures  BRIEF HISTORY:   62 year old male with H/O HTN, DM, and ETOH (Reportedly Quit in Feb 2019)  4/27 Patient presents to ED after witnessed seizure activity. Reportedly patient was at a party with a friend during this event. Per EMS patient had one seizure prior to there arrival and an additional two in the truck. On arrival to ED patient had forth seizure. Glucose 535. PH 7.091. Started on insulin gtt and given one liter fluid bolus. At 2155 patient combative, agitated, and pulling out lines. Placed in 4 point resistants. Given 2 mg ativan x 4 with minimal response. Placed on Precedex gtt. Patient continued to be combative, confused, and thrashing around. Intubated in ED. Neurology consulted. Given Keppra. PCCM asked to admit.   INTERVAL HISTORY:  No events overnight, no further seizure  SIGNIFICANT EVENTS: 4/27 > Presents to ED, witnessed seizures, required intubation  VITAL SIGNS: BP (!) 140/94   Pulse (!) 53   Temp (!) 97.5 F (36.4 C)   Resp 18   Ht  (1.727 m)   Wt 149 lb 14.6 oz (68 kg)   SpO2 100%   BMI 22.79 kg/m    HEMODYNAMICS:    VENTILATOR SETTINGS: Vent Mode: PRVC FiO2 (%):  [40 %-100 %] 40 % Set Rate:  [18 bmp] 18 bmp Vt Set:  [480 mL] 480 mL PEEP:  [5 cmH20] 5 cmH20 Plateau Pressure:  [15 cmH20-17 cmH20] 17 cmH20  INTAKE / OUTPUT: I/O last 3 completed shifts: In: 2010.2 [I.V.:910.2; IV Piggyback:1100] Out: 810 [Urine:810]  PHYSICAL EXAMINATION: General:  Adult male, on vent  Neuro:  Sedated, does not follow commands, withdrawals from pain  HEENT:  [place]/AT, PERRL, EOM-I and MMM Cardiovascular:  RRR, Nl S1/S2 and -M/R/G Lungs:  CTA bilaterally Abdomen: Soft, NT, ND and +BS Musculoskeletal:  -edema  Skin:  Warm, dry, amputation of fourth  and fifth toe of right foot   LABS:  BMET Recent Labs  Lab 06/18/17 2335 06/19/17 0444 06/19/17 0538  NA 138 141 139  K 3.9 4.3 4.2  CL 105 112* 110  CO2 21* 22 23  BUN CREATININE 1.02 0.80 0.78  GLUCOSE 289* 113* 109*   Electrolytes Recent Labs  Lab 06/18/17 2335 06/19/17 0444 06/19/17 0538  CALCIUM 9.0 7.8* 7.8*  MG 2.3  --   --   PHOS 3.0  --   --    CBC Recent Labs  Lab 06/18/17 1930  WBC 10.9*  HGB 15.4  HCT 44.6  PLT 225   Coag's No results for input(s): APTT, INR in the last 168 hours.  Sepsis Markers Recent Labs  Lab 06/18/17 2335 06/19/17 0224 06/19/17 0538 06/19/17 0745  LATICACIDVEN 2.9* 2.5*  --  1.3  PROCALCITON  --   --  0.11  --    ABG Recent Labs  Lab 06/19/17 0114  PHART 7.306*  PCO2ART 41.9  PO2ART 412.0*   Liver Enzymes Recent Labs  Lab 06/18/17 1930  AST 36  ALT <5*  ALKPHOS 154*  BILITOT 0.7  ALBUMIN 4.0   Cardiac Enzymes Recent Labs  Lab 06/18/17 2335 06/19/17 0444  TROPONINI <0.03 <0.03   Glucose Recent Labs  Lab 06/19/17 0048 06/19/17 0151 06/19/17 0301 06/19/17 0412 06/19/17 0507 06/19/17 0849  GLUCAP  205* 114* 93 103* 127* 228*   Imaging Ct Head Wo Contrast  Result Date: 06/18/2017 CLINICAL DATA:  Seizures.  Incontinent of urine. EXAM: CT HEAD WITHOUT CONTRAST TECHNIQUE: Contiguous axial images were obtained from the base of the skull through the vertex without intravenous contrast. COMPARISON:  None. FINDINGS: Brain: Ventricles, cisterns and other CSF spaces are within normal. There is no mass, mass effect, shift of midline structures or acute hemorrhage. No evidence of acute infarction. Vascular: No hyperdense vessel or unexpected calcification. Skull: Possible subtle fracture along the right-sided nasal bone which may be acute or chronic. Sinuses/Orbits: Minimal mucosal membrane thickening over the floor the left maxillary sinus. Orbits are normal. Mild opacification over the inferior right  mastoid air cells. Other: None. IMPRESSION: No acute intracranial findings. Subtle fracture along the right-sided nasal bone which may be acute or chronic. Minimal chronic sinus inflammatory change. Electronically Signed   By: Elberta Fortis M.D.   On: 06/18/2017 20:35   Dg Chest Portable 1 View  Result Date: 06/19/2017 CLINICAL DATA:  Post intubation and gastric tube placement. EXAM: PORTABLE CHEST 1 VIEW COMPARISON:  06/18/2017 FINDINGS: Endotracheal tube tip is 2 cm above the carina. Gastric tube tip and side-port are seen coiled within the stomach, the tip near the expected location of the gastric fundus. Low lung volumes with atelectasis. Cardiomegaly is stable. There is mild aortic atherosclerosis. Retrocardiac infiltrate and/or atelectasis with air bronchograms noted at the left lung base. IMPRESSION: Satisfactory endotracheal and gastric tube positions. Low lung volumes with bibasilar atelectasis. Slightly more confluent airspace opacity with air bronchograms at the left lung base, suspicious for pneumonia and/or atelectasis. Electronically Signed   By: Tollie Eth M.D.   On: 06/19/2017 01:17   Dg Chest Port 1 View  Result Date: 06/18/2017 CLINICAL DATA:  ALOC, seizures. Patient with seizures, has had 3 in the last hour, two in the presence of EMS. Patient is incontinent of urine. Patient's pupil are unequal and CBG of 535. No known heart or lung problems. Non-smoker. EXAM: PORTABLE CHEST 1 VIEW COMPARISON:  12/20/2014 FINDINGS: Study somewhat limited by the supine portable technique and rotation of the patient to the right. Cardiac silhouette is top-normal in size to mildly enlarged. No convincing mediastinal or hilar masses. Lungs are clear.  No convincing pleural effusion.  No pneumothorax. Skeletal structures are grossly intact. IMPRESSION: No active disease. Electronically Signed   By: Amie Portland M.D.   On: 06/18/2017 20:58   STUDIES:  CT Head 4/27 > No acute intracranial findings. Subtle  fracture along the right-sided nasal bone which may be acute or chronic. Minimal chronic sinus inflammatory change. CXR 4/27 > Study somewhat limited by the supine portable technique and rotation of the patient to the right. Cardiac silhouette is top-normal in size to mildly enlarged. No convincing mediastinal or hilar masses. Lungs are clear.  No convincing pleural effusion.  No pneumothorax. Skeletal structures are grossly intact.  CULTURES: Blood 4/27 >> U/A 4/27 >> Tracheal Asp 4/27 >>   ANTIBIOTICS: None.   LINES/TUBES: ETT 4/28 >>   DISCUSSION: 62 year old male presents post-ictal. Glucose 535. Remained encephalopathic requiring intubation.   ASSESSMENT / PLAN:  PULMONARY A: Respiratory Insufficieny in setting of encephalopathy  P:   Vent Support Trend ABG/CXR  Pulmonary Hygiene  VAP Bundle   CARDIOVASCULAR A:  H/O HTN - BP increasing. ? 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   P:   Trend WBC and Fever Curve Trend LA and PCT  Follow Culture Data    ENDOCRINE A:   DKA    P:   DKA protocol   NEUROLOGIC A:   Encephalopathy > Concern for ETOH withdrawal vs drug use, ETOH < 10 (family reports none since 03/2017 and no drug use), UDS only +benzos (received in ED prior) Seizure Activity  - unclear etiology, TG level > 1000 but per nursing was drawn distal to propofol infusion H/O ETOH (Reportedly Quit in Feb 2019)   P:   RASS goal: 0/-1 Neurology Consulted, appreciate their  recommendations Continue Keppra  EEG pending  Continue Propofol, Continue Precedex, Add Fentanyl gtt and titrate for RASS goal  Recheck TG level   FAMILY  - Updates: No family at bedside this morning.  Plan to SBT in AM to exutbate after neuro clears patient from a seizure standpoint  - Inter-disciplinary family meet or Palliative Care meeting due by: 06/25/2017  The patient is critically ill with multiple organ systems failure and requires high complexity decision making for assessment and support, frequent evaluation and titration of therapies, application of advanced monitoring technologies and extensive interpretation of multiple databases.   Critical Care Time devoted to patient care services described in this note is  35  Minutes. This time reflects time of care of this signee Dr Koren Bound. This critical care time does not reflect procedure time, or teaching time or supervisory time of PA/NP/Med student/Med Resident etc but could involve care discussion time.  Alyson Reedy, M.D. [facility]monary/Critical Care Medicine. Pager: [phone]. After hours pager: [phone].

## 2017-06-19 NOTE — Progress Notes (Signed)
PULMONARY / CRITICAL CARE MEDICINE   Name: Ryan Mcbride MRN: [id number] DOB: 1955-05-29    ADMISSION DATE:  06/18/2017 CONSULTATION DATE:  06/18/2017  REFERRING MD:  Dr. Jeraldine Loots   CHIEF COMPLAINT:  AMS/Seziures  BRIEF HISTORY:   62 year old male with H/O HTN, DM, and ETOH (Reportedly Quit in Feb 2019)  4/27 Patient presents to ED after witnessed seizure activity. Reportedly patient was at a party with a friend during this event. Per EMS patient had one seizure prior to there arrival and an additional two in the truck. On arrival to ED patient had forth seizure. Glucose 535. PH 7.091. Started on insulin gtt and given one liter fluid bolus. At 2155 patient combative, agitated, and pulling out lines. Placed in 4 point resistants. Given 2 mg ativan x 4 with minimal response. Placed on Precedex gtt. Patient continued to be combative, confused, and thrashing around. Intubated in ED. Neurology consulted. Given Keppra. PCCM asked to admit.   INTERVAL HISTORY:  Patient is intubated and sedated. Neuro recommends continuous EEG and propofol sedation x 24 hours before tapering.   SIGNIFICANT EVENTS: 4/27 > Presents to ED, witnessed seizures, required intubation  VITAL SIGNS: BP (!) 166/109   Pulse (!) 59   Temp (!) 97.3 F (36.3 C)   Resp 18   Ht  (1.727 m)   Wt 149 lb 14.6 oz (68 kg)   SpO2 100%   BMI 22.79 kg/m   HEMODYNAMICS:    VENTILATOR SETTINGS: Vent Mode: PRVC FiO2 (%):  [40 %-100 %] 40 % Set Rate:  [18 bmp] 18 bmp Vt Set:  [480 mL] 480 mL PEEP:  [5 cmH20] 5 cmH20 Plateau Pressure:  [15 cmH20-17 cmH20] 17 cmH20  INTAKE / OUTPUT: I/O last 3 completed shifts: In: 1794 [I.V.:694; IV Piggyback:1100] Out: 760 [Urine:760]  PHYSICAL EXAMINATION: General:  Adult male, on vent  Neuro:  Sedated, does not follow commands, withdrawals from pain  HEENT:  Dry MM  Cardiovascular:  Tachy, no MRG  Lungs:  Clear breath sounds, no MRG  Abdomen:  Soft, non-tender, active bowel  sounds  Musculoskeletal:  -edema  Skin:  Warm, dry, amputation of fourth and fifth toe of right foot   LABS:  BMET Recent Labs  Lab 06/18/17 2335 06/19/17 0444 06/19/17 0538  NA 138 141 139  K 3.9 4.3 4.2  CL 105 112* 110  CO2 21* 22 23  BUN CREATININE 1.02 0.80 0.78  GLUCOSE 289* 113* 109*    Electrolytes Recent Labs  Lab 06/18/17 2335 06/19/17 0444 06/19/17 0538  CALCIUM 9.0 7.8* 7.8*  MG 2.3  --   --   PHOS 3.0  --   --     CBC Recent Labs  Lab 06/18/17 1930  WBC 10.9*  HGB 15.4  HCT 44.6  PLT 225    Coag's No results for input(s): APTT, INR in the last 168 hours.  Sepsis Markers Recent Labs  Lab 06/18/17 2335 06/19/17 0224 06/19/17 0538  LATICACIDVEN 2.9* 2.5*  --   PROCALCITON  --   --  0.11    ABG Recent Labs  Lab 06/19/17 0114  PHART 7.306*  PCO2ART 41.9  PO2ART 412.0*    Liver Enzymes Recent Labs  Lab 06/18/17 1930  AST 36  ALT <5*  ALKPHOS 154*  BILITOT 0.7  ALBUMIN 4.0    Cardiac Enzymes Recent Labs  Lab 06/18/17 2335 06/19/17 0444  TROPONINI <0.03 <0.03    Glucose Recent Labs  Lab 06/18/17 2337 06/19/17 0048 06/19/17 0151 06/19/17 0301 06/19/17 0412 06/19/17 0507  GLUCAP 373* 205* 114* 93 103* 127*    Imaging Ct Head Wo Contrast  Result Date: 06/18/2017 CLINICAL DATA:  Seizures.  Incontinent of urine. EXAM: CT HEAD WITHOUT CONTRAST TECHNIQUE: Contiguous axial images were obtained from the base of the skull through the vertex without intravenous contrast. COMPARISON:  None. FINDINGS: Brain: Ventricles, cisterns and other CSF spaces are within normal. There is no mass, mass effect, shift of midline structures or acute hemorrhage. No evidence of acute infarction. Vascular: No hyperdense vessel or unexpected calcification. Skull: Possible subtle fracture along the right-sided nasal bone which may be acute or chronic. Sinuses/Orbits: Minimal mucosal membrane thickening over the floor the left maxillary  sinus. Orbits are normal. Mild opacification over the inferior right mastoid air cells. Other: None. IMPRESSION: No acute intracranial findings. Subtle fracture along the right-sided nasal bone which may be acute or chronic. Minimal chronic sinus inflammatory change. Electronically Signed   By: Elberta Fortis M.D.   On: 06/18/2017 20:35   Dg Chest Portable 1 View  Result Date: 06/19/2017 CLINICAL DATA:  Post intubation and gastric tube placement. EXAM: PORTABLE CHEST 1 VIEW COMPARISON:  06/18/2017 FINDINGS: Endotracheal tube tip is 2 cm above the carina. Gastric tube tip and side-port are seen coiled within the stomach, the tip near the expected location of the gastric fundus. Low lung volumes with atelectasis. Cardiomegaly is stable. There is mild aortic atherosclerosis. Retrocardiac infiltrate and/or atelectasis with air bronchograms noted at the left lung base. IMPRESSION: Satisfactory endotracheal and gastric tube positions. Low lung volumes with bibasilar atelectasis. Slightly more confluent airspace opacity with air bronchograms at the left lung base, suspicious for pneumonia and/or atelectasis. Electronically Signed   By: Tollie Eth M.D.   On: 06/19/2017 01:17   Dg Chest Port 1 View  Result Date: 06/18/2017 CLINICAL DATA:  ALOC, seizures. Patient with seizures, has had 3 in the last hour, two in the presence of EMS. Patient is incontinent of urine. Patient's pupil are unequal and CBG of 535. No known heart or lung problems. Non-smoker. EXAM: PORTABLE CHEST 1 VIEW COMPARISON:  12/20/2014 FINDINGS: Study somewhat limited by the supine portable technique and rotation of the patient to the right. Cardiac silhouette is top-normal in size to mildly enlarged. No convincing mediastinal or hilar masses. Lungs are clear.  No convincing pleural effusion.  No pneumothorax. Skeletal structures are grossly intact. IMPRESSION: No active disease. Electronically Signed   By: Amie Portland M.D.   On: 06/18/2017 20:58     STUDIES:  CT Head 4/27 > No acute intracranial findings. Subtle fracture along the right-sided nasal bone which may be acute or chronic. Minimal chronic sinus inflammatory change. CXR 4/27 > Study somewhat limited by the supine portable technique and rotation of the patient to the right. Cardiac silhouette is top-normal in size to mildly enlarged. No convincing mediastinal or hilar masses. Lungs are clear.  No convincing pleural effusion.  No pneumothorax. Skeletal structures are grossly intact.  CULTURES: Blood 4/27 >> U/A 4/27 >> Tracheal Asp 4/27 >>   ANTIBIOTICS: None.   LINES/TUBES: ETT 4/28 >>   DISCUSSION: 62 year old male presents post-ictal. Glucose 535. Remained encephalopathic requiring intubation.   ASSESSMENT / PLAN:  PULMONARY A: Respiratory Insufficieny in setting of encephalopathy  P:   Vent Support Trend ABG/CXR  Pulmonary Hygiene  VAP Bundle   CARDIOVASCULAR A:  H/O HTN - BP increasing. ? 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   P:   Trend WBC and Fever Curve Trend LA and PCT  Follow Culture Data    ENDOCRINE A:   DKA    P:   DKA protocol   NEUROLOGIC A:   Encephalopathy > Concern for ETOH withdrawal vs drug use, ETOH < 10 (family reports none since 03/2017 and no drug use), UDS only +benzos (received in ED prior) Seizure Activity  - unclear etiology, TG level > 1000 but per nursing was drawn distal to propofol infusion H/O ETOH (Reportedly Quit in Feb 2019)   P:    RASS goal: 0/-1 Neurology Consulted, appreciate their recommendations Continue Keppra  EEG pending  Continue Propofol, Continue Precedex, Add Fentanyl gtt and titrate for RASS goal  Recheck TG level   FAMILY  - Updates: No family at bedside this morning   - Inter-disciplinary family meet or Palliative Care meeting due by: 06/25/2017   [facility]   PCCM Pgr: [phone]

## 2017-06-20 ENCOUNTER — Inpatient Hospital Stay (HOSPITAL_COMMUNITY): Payer: Self-pay

## 2017-06-20 LAB — CBC
HCT: 38.4 % — ABNORMAL LOW (ref 39.0–52.0)
Hemoglobin: 13.1 g/dL (ref 13.0–17.0)
MCH: 28.8 pg (ref 26.0–34.0)
MCHC: 34.1 g/dL (ref 30.0–36.0)
MCV: 84.4 fL (ref 78.0–100.0)
PLATELETS: 145 10*3/uL — AB (ref 150–400)
RBC: 4.55 MIL/uL (ref 4.22–5.81)
RDW: 13.2 % (ref 11.5–15.5)
WBC: 9.9 10*3/uL (ref 4.0–10.5)

## 2017-06-20 LAB — BASIC METABOLIC PANEL
ANION GAP: 5 (ref 5–15)
BUN: 11 mg/dL (ref 6–20)
CO2: 23 mmol/L (ref 22–32)
Calcium: 7.9 mg/dL — ABNORMAL LOW (ref 8.9–10.3)
Chloride: 112 mmol/L — ABNORMAL HIGH (ref 101–111)
Creatinine, Ser: 0.9 mg/dL (ref 0.61–1.24)
Glucose, Bld: 136 mg/dL — ABNORMAL HIGH (ref 65–99)
POTASSIUM: 3.9 mmol/L (ref 3.5–5.1)
SODIUM: 140 mmol/L (ref 135–145)

## 2017-06-20 LAB — BLOOD GAS, ARTERIAL
ACID-BASE DEFICIT: 4.4 mmol/L — AB (ref 0.0–2.0)
BICARBONATE: 19.9 mmol/L — AB (ref 20.0–28.0)
Drawn by: 41977
FIO2: 30
O2 Saturation: 97.5 %
PEEP: 5 cmH2O
PH ART: 7.377 (ref 7.350–7.450)
Patient temperature: 98.6
RATE: 18 resp/min
VT: 480 mL
pCO2 arterial: 34.6 mmHg (ref 32.0–48.0)
pO2, Arterial: 96.3 mmHg (ref 83.0–108.0)

## 2017-06-20 LAB — GLUCOSE, CAPILLARY
GLUCOSE-CAPILLARY: 104 mg/dL — AB (ref 65–99)
GLUCOSE-CAPILLARY: 110 mg/dL — AB (ref 65–99)
GLUCOSE-CAPILLARY: 201 mg/dL — AB (ref 65–99)
Glucose-Capillary: 107 mg/dL — ABNORMAL HIGH (ref 65–99)
Glucose-Capillary: 125 mg/dL — ABNORMAL HIGH (ref 65–99)
Glucose-Capillary: 38 mg/dL — CL (ref 65–99)

## 2017-06-20 LAB — PHOSPHORUS: PHOSPHORUS: 3.3 mg/dL (ref 2.5–4.6)

## 2017-06-20 LAB — PROCALCITONIN: PROCALCITONIN: 0.12 ng/mL

## 2017-06-20 LAB — MAGNESIUM: MAGNESIUM: 1.6 mg/dL — AB (ref 1.7–2.4)

## 2017-06-20 MED ORDER — FOLIC ACID 1 MG PO TABS
1.0000 mg | ORAL_TABLET | Freq: Every day | ORAL | Status: DC
  Administered 2017-06-20: 1 mg
  Filled 2017-06-20 (×2): qty 1

## 2017-06-20 MED ORDER — LORAZEPAM 2 MG/ML IJ SOLN
INTRAMUSCULAR | Status: AC
  Filled 2017-06-20: qty 1

## 2017-06-20 MED ORDER — ADULT MULTIVITAMIN LIQUID CH
15.0000 mL | Freq: Every day | ORAL | Status: DC
Start: 2017-06-20 — End: 2017-06-21
  Administered 2017-06-20: 15 mL
  Filled 2017-06-20 (×2): qty 15

## 2017-06-20 MED ORDER — MAGNESIUM SULFATE 2 GM/50ML IV SOLN
2.0000 g | Freq: Once | INTRAVENOUS | Status: AC
Start: 2017-06-20 — End: 2017-06-20
  Administered 2017-06-20: 2 g via INTRAVENOUS
  Filled 2017-06-20: qty 50

## 2017-06-20 MED ORDER — ORAL CARE MOUTH RINSE
15.0000 mL | Freq: Two times a day (BID) | OROMUCOSAL | Status: DC
  Administered 2017-06-21 – 2017-06-22 (×3): 15 mL via OROMUCOSAL

## 2017-06-20 MED ORDER — MIDAZOLAM HCL 2 MG/2ML IJ SOLN: 1.0000 mg | Freq: Once | INTRAMUSCULAR | Status: DC

## 2017-06-20 MED ORDER — LORAZEPAM 2 MG/ML IJ SOLN
1.0000 mg | Freq: Once | INTRAMUSCULAR | Status: AC
  Administered 2017-06-20: 1 mg via INTRAVENOUS

## 2017-06-20 MED ORDER — VITAMIN B-1 100 MG PO TABS
100.0000 mg | ORAL_TABLET | Freq: Every day | ORAL | Status: DC
  Administered 2017-06-20: 100 mg
  Filled 2017-06-20 (×2): qty 1

## 2017-06-20 NOTE — Procedures (Signed)
LTM-EEG Report  HISTORY: Continuous video-EEG monitoring performed for 62 year old with seizures, hyperglycemia.  ACQUISITION: International 10-20 system for electrode placement; 18 channels with additional eyes linked to ipsilateral ears and EKG. Additional T1-T2 electrodes were used. Continuous video recording obtained.   EEG NUMBER:  MEDICATIONS:  Day 1: propofol, midazolam, LEV  DAY #1: from 1043 06/19/17 to 0730 06/20/17  BACKGROUND: An overall medium voltage, mostly continuous recording with some spontaneous variability and reactivity. The record initially consisted of a burst suppression pattern with periods of low voltage suppression lasting 4 to 6 seconds.There were intervening bursts of medium voltage 6-8 Hz activity lasting 1-2-seconds.  Over the course of the recording, periods of suppression dissipated with the emergence of improved waking and sleep patterns.  The waking background later consisted of medium voltage theta-delta activity bilaterally with some vertex waves.  Some periods of mild sustained waking were seen with primarily in a theta background pattern but no clear posterior basic rhythm.  Some short runs of generalized delta activity was seen during state changes which also improved. EPILEPTIFORM/PERIODIC ACTIVITY: none SEIZURES: none EVENTS: none  EKG: no significant arrhythmia  SUMMARY: This was a moderately abnormal continuous video EEG due to mild background slow activity as well as a resolved iatrogenic burst suppression pattern.  No seizures or epileptiform discharges were seen.  There is been continued improvement in background patterns over the course of the recording. The patterns seen were most consistent with a mild diffuse cerebral disturbance.

## 2017-06-20 NOTE — Progress Notes (Signed)
LTM discontinued.  

## 2017-06-20 NOTE — Progress Notes (Addendum)
MD order to extubate pt. RT called and were on their way but as soon as they stepped on the floor pt. (who was in soft wrist restraints) self extubated himself. Pt. o2 sats 93% on RA before RT completely finished pulling the tube. Pt. Calm but not following commands. Sedation off except for precedex, which was cut down to 0.67mcg. Lungs clear.

## 2017-06-20 NOTE — Progress Notes (Signed)
Wasted 50ml of Fentanyl in the sink with Ardith Dark, RN

## 2017-06-20 NOTE — Progress Notes (Signed)
PULMONARY / CRITICAL CARE MEDICINE   Name: Ryan Mcbride MRN: [id number] DOB: 06/28/1955    ADMISSION DATE:  06/18/2017 CONSULTATION DATE:  06/18/2017  REFERRING MD:  Dr. Jeraldine Loots   CHIEF COMPLAINT:  AMS/Seziures  BRIEF HISTORY:   62 year old male with H/O HTN, DM, and ETOH (Reportedly Quit in Feb 2019)  4/27 Patient presents to ED after witnessed seizure activity. Reportedly patient was at a party with a friend during this event. Per EMS patient had one seizure prior to there arrival and an additional two in the truck. On arrival to ED patient had forth seizure. Glucose 535. PH 7.091. Started on insulin gtt and given one liter fluid bolus. At 2155 patient combative, agitated, and pulling out lines. Placed in 4 point resistants. Given 2 mg ativan x 4 with minimal response. Placed on Precedex gtt. Patient continued to be combative, confused, and thrashing around. Intubated in ED. Neurology consulted. Given Keppra. PCCM asked to admit.   INTERVAL HISTORY:  Blood pressures trended down over the course of the day yesterday while on high sedation. Sedation weaned and he received IVF boluses with some response but required neo to support pressures despite stopping sedation. Was off sedation for several hours before becoming agitated. Sedation resumed and blood pressures climbed. Now off any pressors and on propofol, fentanyl and precedex again. On continuous EEG, no further seizure activity seen. Continue propofol suppression per neurology.   SIGNIFICANT EVENTS: 4/27 > Presents to ED, witnessed seizures, required intubation 4/28 > Hypotension likely 2/2 sedation, resolved  VITAL SIGNS: BP (!) 142/93   Pulse 66   Temp (!) 100.8 F (38.2 C)   Resp 19   Ht  (1.727 m)   Wt 149 lb 14.6 oz (68 kg)   SpO2 99%   BMI 22.79 kg/m   HEMODYNAMICS:    VENTILATOR SETTINGS: Vent Mode: PRVC FiO2 (%):  [30 %-40 %] 30 % Set Rate:  [18 bmp] 18 bmp Vt Set:  [480 mL] 480 mL PEEP:  [5 cmH20] 5  cmH20 Plateau Pressure:  [14 cmH20-18 cmH20] 17 cmH20  INTAKE / OUTPUT: I/O last 3 completed shifts: In: 6539.3 [I.V.:3074.3; Other:40; NG/GT:125; IV Piggyback:3300] Out: 2505 [Urine:2345; Emesis/NG output:160]  PHYSICAL EXAMINATION: General:  Adult male, on vent  Neuro:  Sedated, does not follow commands, withdrawals from pain  HEENT:  Dry MM  Cardiovascular:  Tachy, no MRG  Lungs:  Clear breath sounds, no MRG  Abdomen:  Soft, non-tender, active bowel sounds  Musculoskeletal:  -edema  Skin:  Warm, dry, amputation of fourth and fifth toe of right foot   LABS:  BMET Recent Labs  Lab 06/19/17 0444 06/19/17 0538 06/20/17 0648  NA 141 139 140  K 4.3 4.2 3.9  CL 112* 110 112*  CO2 BUN CREATININE 0.80 0.78 0.90  GLUCOSE 113* 109* 136*    Electrolytes Recent Labs  Lab 06/18/17 2335 06/19/17 0444 06/19/17 0538 06/20/17 0648  CALCIUM 9.0 7.8* 7.8* 7.9*  MG 2.3  --   --  1.6*  PHOS 3.0  --   --  3.3    CBC Recent Labs  Lab 06/18/17 1930 06/20/17 0648  WBC 10.9* 9.9  HGB 15.4 13.1  HCT 44.6 38.4*  PLT 225 145*    Coag's No results for input(s): APTT, INR in the last 168 hours.  Sepsis Markers Recent Labs  Lab 06/18/17 2335 06/19/17 0224 06/19/17 0538 06/19/17 0745  LATICACIDVEN 2.9* 2.5*  --  1.3  PROCALCITON  --   --  0.11  --     ABG Recent Labs  Lab 06/19/17 0114 06/20/17 0415  PHART 7.306* 7.377  PCO2ART 41.9 34.6  PO2ART 412.0* 96.3    Liver Enzymes Recent Labs  Lab 06/18/17 1930  AST 36  ALT <5*  ALKPHOS 154*  BILITOT 0.7  ALBUMIN 4.0    Cardiac Enzymes Recent Labs  Lab 06/18/17 2335 06/19/17 0444 06/19/17 1309  TROPONINI <0.03 <0.03 <0.03    Glucose Recent Labs  Lab 06/19/17 1647 06/19/17 2018 06/19/17 2331 06/20/17 0147 06/20/17 0423 06/20/17 0716  GLUCAP 157* 73 38* 107* 104* 125*    Imaging Dg Chest Port 1 View  Result Date: 06/20/2017 CLINICAL DATA:  Endotracheal tube. EXAM:  PORTABLE CHEST 1 VIEW COMPARISON:  Radiograph of June 19, 2017. FINDINGS: Stable cardiomediastinal silhouette. Endotracheal and nasogastric tubes are unchanged in position. No pneumothorax is noted. Stable bibasilar subsegmental atelectasis. Minimal right pleural effusion is noted. Bony thorax is unremarkable. IMPRESSION: Stable support apparatus. Stable bibasilar subsegmental atelectasis. Stable minimal right pleural effusion. Electronically Signed   By: Lupita Raider, M.D.   On: 06/20/2017 07:36    STUDIES:  CT Head 4/27 > No acute intracranial findings. Subtle fracture along the right-sided nasal bone which may be acute or chronic. Minimal chronic sinus inflammatory change. CXR 4/27 > Study somewhat limited by the supine portable technique and rotation of the patient to the right. Cardiac silhouette is top-normal in size to mildly enlarged. No convincing mediastinal or hilar masses. Lungs are clear.  No convincing pleural effusion.  No pneumothorax. Skeletal structures are grossly intact.  CULTURES: Blood 4/27 >> U/A 4/27 >> Tracheal Asp 4/27 >>   ANTIBIOTICS: None.   LINES/TUBES: ETT 4/28 >>   DISCUSSION: 62 year old male presents post-ictal. Glucose 535. Remained encephalopathic requiring intubation.   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   P:   Trend WBC and Fever Curve Trend  PCT  Follow Culture Data    ENDOCRINE A:   DKA  - resolved  P:   SSI CBG q4hr  NEUROLOGIC A:   Encephalopathy > Concern for ETOH withdrawal vs drug use, ETOH < 10 (family reports none since 03/2017 and no drug use), UDS only +benzos (received in ED prior) Seizure Activity  - unclear etiology H/O ETOH (Reportedly Quit in Feb 2019)   P:   RASS goal: 0/-1 Neurology Consulted, appreciate their recommendations Continue Keppra  EEG continuous  Continue Propofol, Continue Precedex, Add Fentanyl gtt and titrate for RASS goal   FAMILY  - Updates: No family at bedside this morning   - Inter-disciplinary family meet or Palliative Care meeting due by: 06/25/2017   [facility]   PCCM Pgr: [phone]

## 2017-06-20 NOTE — Progress Notes (Signed)
RT called to extubate pt per MD order.  When RT arrived at bedside, the pt had already self extubated.  Pt placed on Lav[place] 4 L with humidity, no stridor noted.  Pt is unresponsive, MD and RN notified.  RT will continue to monitor.

## 2017-06-20 NOTE — Progress Notes (Signed)
Subjective: No further events concerning for seizure  Exam: Vitals:   06/20/17 0727 06/20/17 0800  BP: (!) 142/93 (!) 135/94  Pulse: 66 64  Resp: 19 19  Temp:  (!) 100.4 F (38 C)  SpO2: 99% 99%   Gen: In bed, NAD Resp: non-labored breathing, no acute distress Abd: soft, nt  Neuro: MS: opens eyes to nox stim, intensely agitated, does not follow commands  WU:[id number] slightly asymmetric, but reactive. Motor: he withdraws briskly to nox stim x 4 Sensory: as above.   Pertinent Labs: BMP - unremarkable  Impression: 62 yo M with seizures in the setting of hyperglycemia. Though possible, 500s do not typically cause seizures. No signs   Recommendations: 1)MRI brain 2) D/C EEG 3) Continue keppra  BID 4) Will follow.   Ritta Slot, MD [facility] [phone]  If 7pm- 7am, please page neurology on call as listed in AMION.

## 2017-06-21 DIAGNOSIS — E1311 Other specified diabetes mellitus with ketoacidosis with coma: Secondary | ICD-10-CM

## 2017-06-21 DIAGNOSIS — R569 Unspecified convulsions: Secondary | ICD-10-CM

## 2017-06-21 LAB — GLUCOSE, CAPILLARY
GLUCOSE-CAPILLARY: 154 mg/dL — AB (ref 65–99)
GLUCOSE-CAPILLARY: 182 mg/dL — AB (ref 65–99)
GLUCOSE-CAPILLARY: 259 mg/dL — AB (ref 65–99)
Glucose-Capillary: 203 mg/dL — ABNORMAL HIGH (ref 65–99)
Glucose-Capillary: 242 mg/dL — ABNORMAL HIGH (ref 65–99)

## 2017-06-21 LAB — CBC
HCT: 36 % — ABNORMAL LOW (ref 39.0–52.0)
Hemoglobin: 12.6 g/dL — ABNORMAL LOW (ref 13.0–17.0)
MCH: 28.8 pg (ref 26.0–34.0)
MCHC: 35 g/dL (ref 30.0–36.0)
MCV: 82.4 fL (ref 78.0–100.0)
PLATELETS: 140 10*3/uL — AB (ref 150–400)
RBC: 4.37 MIL/uL (ref 4.22–5.81)
RDW: 12.6 % (ref 11.5–15.5)
WBC: 7.6 10*3/uL (ref 4.0–10.5)

## 2017-06-21 LAB — CULTURE, RESPIRATORY W GRAM STAIN

## 2017-06-21 LAB — CULTURE, RESPIRATORY: CULTURE: NORMAL

## 2017-06-21 LAB — BASIC METABOLIC PANEL
Anion gap: 10 (ref 5–15)
BUN: 7 mg/dL (ref 6–20)
CALCIUM: 7.9 mg/dL — AB (ref 8.9–10.3)
CHLORIDE: 104 mmol/L (ref 101–111)
CO2: 22 mmol/L (ref 22–32)
Creatinine, Ser: 0.92 mg/dL (ref 0.61–1.24)
Glucose, Bld: 180 mg/dL — ABNORMAL HIGH (ref 65–99)
Potassium: 3.7 mmol/L (ref 3.5–5.1)
SODIUM: 136 mmol/L (ref 135–145)

## 2017-06-21 LAB — PROCALCITONIN: PROCALCITONIN: 0.18 ng/mL

## 2017-06-21 MED ORDER — INSULIN ASPART 100 UNIT/ML ~~LOC~~ SOLN
0.0000 [IU] | Freq: Three times a day (TID) | SUBCUTANEOUS | Status: DC
  Administered 2017-06-21: 5 [IU] via SUBCUTANEOUS
  Administered 2017-06-22: 3 [IU] via SUBCUTANEOUS
  Administered 2017-06-22: 2 [IU] via SUBCUTANEOUS

## 2017-06-21 MED ORDER — LIVING WELL WITH DIABETES BOOK - IN SPANISH
Freq: Once | Status: AC
  Administered 2017-06-21: 17:00:00
  Filled 2017-06-21: qty 1

## 2017-06-21 MED ORDER — INSULIN ASPART 100 UNIT/ML ~~LOC~~ SOLN
4.0000 [IU] | Freq: Three times a day (TID) | SUBCUTANEOUS | Status: DC
  Administered 2017-06-22 (×2): 4 [IU] via SUBCUTANEOUS

## 2017-06-21 MED ORDER — PANTOPRAZOLE SODIUM 40 MG PO TBEC: 40.0000 mg | DELAYED_RELEASE_TABLET | Freq: Every day | ORAL | Status: DC

## 2017-06-21 MED ORDER — INSULIN ASPART 100 UNIT/ML ~~LOC~~ SOLN
0.0000 [IU] | Freq: Every day | SUBCUTANEOUS | Status: DC
  Administered 2017-06-21: 2 [IU] via SUBCUTANEOUS

## 2017-06-21 MED ORDER — VITAMIN B-1 100 MG PO TABS
100.0000 mg | ORAL_TABLET | Freq: Every day | ORAL | Status: DC
  Administered 2017-06-21 – 2017-06-22 (×2): 100 mg via ORAL
  Filled 2017-06-21 (×2): qty 1

## 2017-06-21 MED ORDER — METFORMIN HCL 500 MG PO TABS
1000.0000 mg | ORAL_TABLET | Freq: Two times a day (BID) | ORAL | Status: DC
  Administered 2017-06-21 – 2017-06-22 (×2): 1000 mg via ORAL
  Filled 2017-06-21 (×2): qty 2

## 2017-06-21 MED ORDER — FOLIC ACID 1 MG PO TABS
1.0000 mg | ORAL_TABLET | Freq: Every day | ORAL | Status: DC
  Administered 2017-06-21 – 2017-06-22 (×2): 1 mg via ORAL
  Filled 2017-06-21 (×2): qty 1

## 2017-06-21 MED ORDER — GLIMEPIRIDE 1 MG PO TABS
1.0000 mg | ORAL_TABLET | Freq: Every day | ORAL | Status: DC
  Administered 2017-06-22: 1 mg via ORAL
  Filled 2017-06-21: qty 1

## 2017-06-21 MED ORDER — GLYBURIDE 5 MG PO TABS
5.0000 mg | ORAL_TABLET | Freq: Two times a day (BID) | ORAL | Status: DC
  Filled 2017-06-21: qty 1

## 2017-06-21 MED ORDER — MAGNESIUM SULFATE 4 GM/100ML IV SOLN
4.0000 g | Freq: Once | INTRAVENOUS | Status: AC
  Administered 2017-06-21: 4 g via INTRAVENOUS
  Filled 2017-06-21: qty 100

## 2017-06-21 MED ORDER — ADULT MULTIVITAMIN W/MINERALS CH
1.0000 | ORAL_TABLET | Freq: Every day | ORAL | Status: DC
  Administered 2017-06-21 – 2017-06-22 (×2): 1 via ORAL
  Filled 2017-06-21 (×2): qty 1

## 2017-06-21 MED ORDER — POTASSIUM CHLORIDE CRYS ER 20 MEQ PO TBCR
40.0000 meq | EXTENDED_RELEASE_TABLET | Freq: Once | ORAL | Status: AC
  Administered 2017-06-21: 40 meq via ORAL
  Filled 2017-06-21: qty 2

## 2017-06-21 NOTE — Progress Notes (Signed)
Inpatient Diabetes Program Recommendations  AACE/ADA: New Consensus Statement on Inpatient Glycemic Control (2015)  Target Ranges:  Prepandial:   less than 140 mg/dL      Peak postprandial:   less than 180 mg/dL (1-2 hours)      Critically ill patients:  140 - 180 mg/dL   Results for Ryan Mcbride, Ryan Mcbride (MRN [id number]) as of 06/21/2017 14:06  Ref. Range 06/20/2017 04:23 06/20/2017 07:16 06/20/2017 11:59 06/20/2017 19:39 06/21/2017 00:21 06/21/2017 08:31 06/21/2017 12:56  Glucose-Capillary Latest Ref Range: 65 - 99 mg/dL 409 (H) 811 (H) 914 (H) 201 (H) 182 (H) 154 (H) 203 (H)   Results for Ryan Mcbride, Ryan Mcbride (MRN [id number]) as of 06/21/2017 14:06  Ref. Range 06/19/2017 02:24  Hemoglobin A1C Latest Ref Range: 4.8 - 5.6 % 10.0 (H)   Review of Glycemic Control  Diabetes history: DM2 Outpatient Diabetes medications: None - but use to take Glyburide 5 mg BID and Metformin 1000 mg BID Current orders for Inpatient glycemic control: Novolog 0-15 units Q4H  Inpatient Diabetes Program Recommendations: Oral Agents: May want to consider ordering Amaryl 1 mg daily and Metformin 1000 mg BID while inpatient to determine effect on glycemic control. HgbA1C: A1C 10% on 06/19/17 indicating an average glucose of 240 mg/dl over the past 2-3 months. Would recommend ordering Amaryl 1 mg daily and Metformin 1000 mg BID and instruct patient to follow up with provider regarding DM management.  Note: Used Stratus device to translate during conversation Davonna Belling [phone]). Spoke with patient about diabetes and home regimen for diabetes control. Patient reports that he goes to the "clinic at the hospital" but in further talking with patient question if he is referring to podiatrist and not a PCP.  Patient states that he is not taking any DM medications at home or checking his glucose. Patient states that he use to take DM medications but he stopped taking them because they made him "feel bad". Asked patient to explain how he felt and he  reported he "got shaky hands and made my head feel not so good."  Question if patient knew symptoms of hypoglycemia and patient did not have any knowledge of hypoglycemia or what symptoms would be. Discussed hypoglycemia along with symptoms and proper treatment and patient states that those are the symptoms he had.  Discussed Glyburide and Metformin and explained how each medication works for DM control. Also explained that if he was having hypoglycemia while taking the medications, then the medications may have needed to be adjusted. Encouraged patient to be sure to let his doctor know how his glucose is trending and to be sure to tell them if he is having any issues with hypoglycemia.  Inquired about prior A1C and patient does not know what an A1C is. Explained what an A1C is and discussed A1C results (10.0% on 06/19/17) and explained that his current A1C indicates an average glucose of 240 mg/dl over the past 2-3 months. Discussed glucose and A1C goals. Discussed importance of checking CBGs and maintaining good CBG control to prevent long-term and short-term complications. Explained how hyperglycemia leads to damage within blood vessels which lead to the common complications seen with uncontrolled diabetes. Stressed to the patient the importance of improving glycemic control to prevent further complications from uncontrolled diabetes. Discussed Reli-On Prime glucometer for $9 and box of 50 test strips for $9 at [facility]. Patient states that he feels he can purchase a new glucometer and testing supplies.  Encouraged patient to check his glucose as MD directs and any other  time he has symptoms of hypoglycemia. Also encouraged patient to take his glucometer with him to doctor appointments. Explained how the doctor he follows up with can use the information to make adjustments with DM medications if needed.Informed patient Living Well with Diabetes book would be ordered and encouraged patient to read entire book.  Patient states that he can read some but not well but he has family that can help him read over the information. Patient states that he will start to check his glucose and take medication as directed.  Patient verbalized understanding of information discussed and he states that he has no further questions at this time related to diabetes. Not able to determine if patient has PCP or is definitely going to a clinic. Consulted Case Manager to assist with follow up.   Of all the medicaitons in the sulfonylureas, Glyburide is the most likely to cause hypoglycemia. Could use a different sulfonylurea medication such as Amaryl which is still as affordable and only $4 at [facility] for 30 day supply.   Thanks, Orlando Penner, RN, MSN, CDE Diabetes Coordinator Inpatient Diabetes Program [phone] (Team Pager)

## 2017-06-21 NOTE — Progress Notes (Addendum)
PROGRESS NOTE    Hernandez Losasso  ZOX:[id number] DOB: 1955/03/14 DOA: 06/18/2017 PCP: Patient, [name]  Brief Narrative: 62 year old male admitted 06/18/2017 with multiple seizures.  Her last glucose was found to be 535 pH 7.09 he was started on IV insulin drip as well as fluid boluses.  he was intubated for airway protection.  He was extubated 06/20/2017.  Patient was seen in consultation by neurology.  Initially started on Keppra and DC'd Keppra today.  MRI showed no definite seizure focus, EEG showed no definite epileptic focus, so Keppra was stopped 06/21/2017.  In addition to this she has a history of hypertension diabetes and alcohol abuse.  He quit alcohol use February 2019 reportedly.  He was at a party prior to admission to the hospital.  His urine drug screen showed only benzos which she received in the ED.  Patient was admitted to [facility] and [facility] picked up 06/21/2017.  Assessment & Plan:   Active Problems:   DKA (diabetic ketoacidoses) (HCC)  1] status post DKA in a patient with known history of diabetes-patient treated with insulin drip.  I will start him back on metformin and amaryl.Check hemoglobin A1c.  If his hemoglobin A1c comes back very high consider putting him on insulin.  Sliding scale insulin.  2] multiple seizures  thought to be secondary to probable hyperglycemia-on no seizure medications at this time.  Neurology following.     DVT prophylaxis: Heparin Code Status: Full code  Family Communication:none Disposition Plan I have placed a PT OT evaluation today if his blood sugar remained stable and he remained stable without seizures plan discharge in the next 24 to 48 hours. Consultants: Neurology, PCCM transfer  Procedures: Status post intubation and  self extubation Antimicrobials: None Subjective: Awake denies any nausea vomiting does not remember when was his last bowel movement.   Objective: Vitals:   06/21/17 1100 06/21/17 1138 06/21/17 1200 06/21/17 1300  BP:  (!) 174/95  (!) 155/96   Pulse: 86  80 81  Resp: Temp:  99.4 F (37.4 C)    TempSrc:  Oral    SpO2: 96%  95% 95%  Weight:      Height:        Intake/Output Summary (Last 24 hours) at 06/21/2017 1337 Last data filed at 06/21/2017 1136 Gross per 24 hour  Intake 1310 ml  Output 3975 ml  Net -2665 ml   Filed Weights   06/18/17 1911 06/19/17 0141  Weight: 65.8 kg (145 lb) 68 kg (149 lb 14.6 oz)    Examination:  General exam: Appears calm and comfortable  Respiratory system: Clear to auscultation. Respiratory effort normal. Cardiovascular system: S1 & S2 heard, RRR. No JVD, murmurs, rubs, gallops or clicks. No pedal edema. Gastrointestinal system: Abdomen is DISTENDED , soft and nontender. No organomegaly or masses felt. Normal bowel sounds heard. Central nervous system: Alert and oriented. No focal neurological deficits. Extremities: Symmetric 5 x 5 power. Skin: No rashes, lesions or ulcers Psychiatry: Judgement and insight appear normal. Mood & affect appropriate.     Data Reviewed: I have personally reviewed following labs and imaging studies  CBC: Recent Labs  Lab 06/18/17 1930 06/20/17 0648 06/21/17 0407  WBC 10.9* 9.9 7.6  NEUTROABS 5.7  --   --   HGB 15.4 13.1 12.6*  HCT 44.6 38.4* 36.0*  MCV 85.4 84.4 82.4  PLT 225 145* 140*   Basic Metabolic Panel: Recent Labs  Lab 06/18/17 2335 06/19/17 0444 06/19/17  1610 06/20/17 0648 06/21/17 0407  NA 138 141 139 140 136  K 3.9 4.3 4.2 3.9 3.7  CL 105 112* 110 112* 104  CO2 21* GLUCOSE 289* 113* 109* 136* 180*  BUN CREATININE 1.02 0.80 0.78 0.90 0.92  CALCIUM 9.0 7.8* 7.8* 7.9* 7.9*  MG 2.3  --   --  1.6*  --   PHOS 3.0  --   --  3.3  --    GFR: Estimated Creatinine Clearance: 80.1 mL/min (by C-G formula based on SCr of 0.92 mg/dL). Liver Function Tests: Recent Labs  Lab 06/18/17 1930  AST 36  ALT <5*  ALKPHOS 154*  BILITOT 0.7  PROT 7.5  ALBUMIN 4.0   Recent  Labs  Lab 06/18/17 1930  LIPASE 29   No results for input(s): AMMONIA in the last 168 hours. Coagulation Profile: No results for input(s): INR, PROTIME in the last 168 hours. Cardiac Enzymes: Recent Labs  Lab 06/18/17 2335 06/19/17 0444 06/19/17 1309  CKTOTAL  --  178  --   TROPONINI <0.03 <0.03 <0.03   BNP (last 3 results) No results for input(s): PROBNP in the last 8760 hours. HbA1C: Recent Labs    06/19/17 0224  HGBA1C 10.0*   CBG: Recent Labs  Lab 06/20/17 1159 06/20/17 1939 06/21/17 0021 06/21/17 0831 06/21/17 1256  GLUCAP 110* 201* 182* 154* 203*   Lipid Profile: Recent Labs    06/19/17 0224 06/19/17 0745  TRIG 1,320* 131   Thyroid Function Tests: No results for input(s): TSH, T4TOTAL, FREET4, T3FREE, THYROIDAB in the last 72 hours. Anemia Panel: No results for input(s): VITAMINB12, FOLATE, FERRITIN, TIBC, IRON, RETICCTPCT in the last 72 hours. Sepsis Labs: Recent Labs  Lab 06/18/17 2335 06/19/17 0224 06/19/17 0538 06/19/17 0745 06/20/17 0648 06/21/17 0407  PROCALCITON  --   --  0.11  --  0.12 0.18  LATICACIDVEN 2.9* 2.5*  --  1.3  --   --     Recent Results (from the past 240 hour(s))  Culture, blood (routine x 2)     Status: None (Preliminary result)   Collection Time: 06/19/17 12:05 AM  Result Value Ref Range Status   Specimen Description BLOOD LEFT FOREARM  Final   Special Requests   Final    BOTTLES DRAWN AEROBIC AND ANAEROBIC Blood Culture results may not be optimal due to an inadequate volume of blood received in culture bottles   Culture   Final    NO GROWTH 2 DAYS Performed at [facility], [address], [place], [place] [address]    Report Status PENDING  Incomplete  MRSA PCR Screening     Status: None   Collection Time: 06/19/17  1:45 AM  Result Value Ref Range Status   MRSA by PCR NEGATIVE NEGATIVE Final    Comment:        The GeneXpert MRSA Assay (FDA approved for NASAL specimens only), is one component of  a comprehensive MRSA colonization surveillance program. It is not intended to diagnose MRSA infection nor to guide or monitor treatment for MRSA infections. Performed at [facility], [address], [place], [place] [address]   Culture, blood (routine x 2)     Status: None (Preliminary result)   Collection Time: 06/19/17  4:42 AM  Result Value Ref Range Status   Specimen Description BLOOD RIGHT ARM  Final   Special Requests   Final    BOTTLES  DRAWN AEROBIC AND ANAEROBIC Blood Culture adequate volume   Culture   Final    NO GROWTH 2 DAYS Performed at [facility], [address] [place], [place] [address]    Report Status PENDING  Incomplete  Culture, respiratory (NON-Expectorated)     Status: None   Collection Time: 06/19/17  6:08 AM  Result Value Ref Range Status   Specimen Description TRACHEAL ASPIRATE  Final   Special Requests NONE  Final   Gram Stain   Final    NO SQUAMOUS EPITHELIAL CELLS PRESENT FEW WBC PRESENT,BOTH PMN AND MONONUCLEAR FEW YEAST MODERATE GRAM NEGATIVE COCCI ABUNDANT GRAM POSITIVE COCCI IN PAIRS AND CHAINS FEW GRAM POSITIVE RODS    Culture   Final    MODERATE Consistent with normal respiratory flora. Performed at [facility], [address], [place], [place] [address]    Report Status 06/21/2017 FINAL  Final         Radiology Studies: Mr Brain Wo Contrast  Result Date: 06/20/2017 CLINICAL DATA:  62 y/o  M; seizure. EXAM: MRI HEAD WITHOUT CONTRAST TECHNIQUE: Multiplanar, multiecho pulse sequences of the brain and surrounding structures were obtained without intravenous contrast. COMPARISON:  06/18/2017 CT head. FINDINGS: Brain: Motion degradation on multiple sequences. No acute infarction, hemorrhage, hydrocephalus, extra-axial collection or mass lesion. Hippocampi are symmetric in size and signal bilaterally. No gross structural abnormality of the brain. Vascular: Normal flow voids. Skull and upper cervical spine: Normal marrow  signal. Sinuses/Orbits: Mild left maxillary sinus mucosal thickening. Trace right mastoid effusion. Orbits are unremarkable. Other: None. IMPRESSION: 1. Motion degraded study. 2. No structural cause of seizure identified. 3. No acute intracranial abnormality. Electronically Signed   By: Mitzi Hansen M.D.   On: 06/20/2017 17:49   Dg Chest Port 1 View  Result Date: 06/20/2017 CLINICAL DATA:  Endotracheal tube. EXAM: PORTABLE CHEST 1 VIEW COMPARISON:  Radiograph of June 19, 2017. FINDINGS: Stable cardiomediastinal silhouette. Endotracheal and nasogastric tubes are unchanged in position. No pneumothorax is noted. Stable bibasilar subsegmental atelectasis. Minimal right pleural effusion is noted. Bony thorax is unremarkable. IMPRESSION: Stable support apparatus. Stable bibasilar subsegmental atelectasis. Stable minimal right pleural effusion. Electronically Signed   By: Lupita Raider, M.D.   On: 06/20/2017 07:36        Scheduled Meds: . folic acid  1 mg Oral Daily  . heparin  5,000 Units Subcutaneous Q8H  . insulin aspart  0-15 Units Subcutaneous Q4H  . mouth rinse  15 mL Mouth Rinse BID  . multivitamin with minerals  1 tablet Oral Daily  . pantoprazole  40 mg Oral QHS  . thiamine  100 mg Oral Daily   Continuous Infusions: . sodium chloride Stopped (06/20/17 1600)     LOS: 3 days     Alwyn Ren, MD [facility]  If 7PM-7AM, please contact night-coverage [email or web address] Password [email or web address] 06/21/2017, 1:37 PM

## 2017-06-21 NOTE — Progress Notes (Addendum)
Subjective: Markedly improved since extubation  Exam: Vitals:   06/21/17 0900 06/21/17 1000  BP:    Pulse: 83 92  Resp: 18 (!) 30  Temp:    SpO2: 92% 93%   Gen: In bed, NAD Resp: non-labored breathing, no acute distress Abd: soft, nt  Neuro: MS: Awake, alert, oriented to month and year Cranial nerves: Pupils equal round reactive, visual fields full, extraocular movements intact Motor: He moves all extremities with 5/5 strength Sensory: Endorses symmetric sensation bilaterally.  MRI-no definite seizure focus EEG-no definite epileptic focus  Impression: 62 yo M with seizures in the setting of hyperglycemia. Though possible, 500s do not typically cause seizures. No signs of infection, and his mental status is currently clear.  I do think that it is possible that the hyperglycemia was related to his seizures.  Whether this was provoked or unprovoked, however, I do not think any further testing is needed right now.  With this being a first-time seizure and possibly provoked, I would not favor continued antiepileptic therapy.  Recommendations: 1) discontinue Keppra 2) I have discussed seizure precautions including that he is not allowed to drive by state law, and avoid high areas such as ladders, avoid swimming. 3) no further recommendations at this time, please call if further questions or concerns.  Ritta Slot, MD [facility] [phone]  If 7pm- 7am, please page neurology on call as listed in AMION.

## 2017-06-21 NOTE — Evaluation (Signed)
Physical Therapy Evaluation Patient Details Name: Ryan Mcbride MRN: [id number] DOB: 12-16-1955 Today's Date: 06/21/2017   History of Present Illness  Patient presents to ED after witnessed seizure activity. Reportedly patient was at a party with a friend during this event. Per EMS patient had one seizure prior to there arrival and an additional two in the truck. On arrival to ED patient had forth seizure. Glucose 535. PH 7.091.  Intubated for airway protection, self-extubated  Clinical Impression   Pt admitted with above diagnosis. Pt currently with functional limitations due to the deficits listed below (see PT Problem List). Presents with moderate balance deficits and some disorientation with ICU stay; Lives alone and must be independent or modified independent to be able to dc home; Will plan for next session to help discern cane versus RW versus no assistive device;  Pt will benefit from skilled PT to increase their independence and safety with mobility to allow discharge to the venue listed below.       Follow Up Recommendations No PT follow up    Equipment Recommendations  Rolling walker with 5" wheels;Cane(plan to discern RW versus cane vs no device next session)    Recommendations for Other Services       Precautions / Restrictions Precautions Precautions: Fall      Mobility  Bed Mobility Overal bed mobility: Modified Independent                Transfers Overall transfer level: Needs assistance Equipment used: None Transfers: Sit to/from Stand Sit to Stand: Supervision         General transfer comment: Supervision for safety  Ambulation/Gait Ambulation/Gait assistance: Min assist;Mod assist Ambulation Distance (Feet): 150 Feet Assistive device: None Gait Pattern/deviations: Step-through pattern;Drifts right/left     General Gait Details: Slow, guarded gait withone major loss of balance to the left, requiring Mod assist to correct  Stairs             Wheelchair Mobility    Modified Rankin (Stroke Patients Only)       Balance                                             Pertinent Vitals/Pain Pain Assessment: No/denies pain    Home Living Family/patient expects to be discharged to:: Private residence Living Arrangements: Alone Available Help at Discharge: Friend(s);Family;Available PRN/intermittently Type of Home: (unsure if house or apartment) Home Access: Stairs to enter   [employer] of Steps: 8 Home Layout: Two level Home Equipment: None Additional Comments: Works full-time    Prior Function Level of Independence: Independent               Higher education careers adviser        Extremity/Trunk Assessment   Upper Extremity Assessment Upper Extremity Assessment: Overall WFL for tasks assessed    Lower Extremity Assessment Lower Extremity Assessment: Generalized weakness(mild weakness and decr coordination)       Communication   Communication: Prefers language other than English(Spanish)  Cognition Arousal/Alertness: Awake/alert Behavior During Therapy: WFL for tasks assessed/performed Overall Cognitive Status: Impaired/Different from baseline Area of Impairment: Orientation;Safety/judgement                 Orientation Level: Disoriented to;Time       Safety/Judgement: Decreased awareness of deficits     General Comments: Pt did report he wants to take better care  of himself      General Comments General comments (skin integrity, edema, etc.): [place], [phone], with [facility] [employer]pre[employer] assisted via speakerphone    Exercises     Assessment/Plan    PT Assessment Patient needs continued PT services  PT Problem List Decreased strength;Decreased activity tolerance;Decreased balance;Decreased mobility;Decreased coordination;Decreased knowledge of use of DME;Decreased cognition       PT Treatment Interventions DME instruction;Gait training;Stair training;Functional  mobility training;Therapeutic activities;Therapeutic exercise;Balance training;Cognitive remediation;Patient/family education    PT Goals (Current goals can be found in the Care Plan section)  Acute Rehab PT Goals Patient Stated Goal: wants to take better care of himself PT Goal Formulation: With patient Time For Goal Achievement: 07/05/17 Potential to Achieve Goals: Good    Frequency Min 3X/week   Barriers to discharge Decreased caregiver support Lives alone    Co-evaluation               AM-PAC PT "6 Clicks" Daily Activity  Outcome Measure Difficulty turning over in bed (including adjusting bedclothes, sheets and blankets)?: None Difficulty moving from lying on back to sitting on the side of the bed? : A Little Difficulty sitting down on and standing up from a chair with arms (e.g., wheelchair, bedside commode, etc,.)?: A Little Help needed moving to and from a bed to chair (including a wheelchair)?: A Little Help needed walking in hospital room?: A Little Help needed climbing 3-5 steps with a railing? : A Little 6 Click Score: 19    End of Session Equipment Utilized During Treatment: Gait belt Activity Tolerance: Patient tolerated treatment well Patient left: in chair;with call bell/phone within reach;with chair alarm set Nurse Communication: Mobility status PT Visit Diagnosis: Unsteadiness on feet (R26.81)    Time: 6045-4098 PT Time Calculation (min) (ACUTE ONLY): 37 min   Charges:   PT Evaluation $PT Eval Moderate Complexity: 1 Mod PT Treatments $Gait Training: 8-22 mins   PT G Codes:        Van Clines, PT  Acute Rehabilitation Services Pager [phone] Office [phone]   Levi Aland 06/21/2017, 4:42 PM

## 2017-06-21 NOTE — Progress Notes (Signed)
Received patient from 25M, oriented to room. Skin intact. Placed on tele box 14.

## 2017-06-22 DIAGNOSIS — E1110 Type 2 diabetes mellitus with ketoacidosis without coma: Principal | ICD-10-CM

## 2017-06-22 DIAGNOSIS — J9601 Acute respiratory failure with hypoxia: Secondary | ICD-10-CM

## 2017-06-22 LAB — CBC WITH DIFFERENTIAL/PLATELET
Basophils Absolute: 0 10*3/uL (ref 0.0–0.1)
Basophils Relative: 0 %
EOS ABS: 0.1 10*3/uL (ref 0.0–0.7)
EOS PCT: 2 %
HCT: 35.5 % — ABNORMAL LOW (ref 39.0–52.0)
HEMOGLOBIN: 12.4 g/dL — AB (ref 13.0–17.0)
LYMPHS ABS: 1.6 10*3/uL (ref 0.7–4.0)
Lymphocytes Relative: 24 %
MCH: 28.6 pg (ref 26.0–34.0)
MCHC: 34.9 g/dL (ref 30.0–36.0)
MCV: 81.8 fL (ref 78.0–100.0)
MONO ABS: 0.6 10*3/uL (ref 0.1–1.0)
MONOS PCT: 9 %
NEUTROS PCT: 65 %
Neutro Abs: 4.4 10*3/uL (ref 1.7–7.7)
Platelets: 152 10*3/uL (ref 150–400)
RBC: 4.34 MIL/uL (ref 4.22–5.81)
RDW: 12.5 % (ref 11.5–15.5)
WBC: 6.8 10*3/uL (ref 4.0–10.5)

## 2017-06-22 LAB — HEMOGLOBIN A1C
HEMOGLOBIN A1C: 9.8 % — AB (ref 4.8–5.6)
Mean Plasma Glucose: 234.56 mg/dL

## 2017-06-22 LAB — BASIC METABOLIC PANEL
Anion gap: 11 (ref 5–15)
BUN: 6 mg/dL (ref 6–20)
CALCIUM: 8.2 mg/dL — AB (ref 8.9–10.3)
CO2: 24 mmol/L (ref 22–32)
CREATININE: 0.86 mg/dL (ref 0.61–1.24)
Chloride: 102 mmol/L (ref 101–111)
GFR calc Af Amer: >60 mL/min (ref >60)
GFR calc non Af Amer: >60 mL/min (ref >60)
GLUCOSE: 186 mg/dL — AB (ref 65–99)
Potassium: 3.8 mmol/L (ref 3.5–5.1)
Sodium: 137 mmol/L (ref 135–145)

## 2017-06-22 LAB — MAGNESIUM: Magnesium: 1.8 mg/dL (ref 1.7–2.4)

## 2017-06-22 LAB — GLUCOSE, CAPILLARY
Glucose-Capillary: 204 mg/dL — ABNORMAL HIGH (ref 65–99)
Glucose-Capillary: 190 mg/dL — ABNORMAL HIGH (ref 65–99)

## 2017-06-22 MED ORDER — ADULT MULTIVITAMIN W/MINERALS CH: 1.0000 | ORAL_TABLET | Freq: Every day | ORAL | Status: DC

## 2017-06-22 MED ORDER — METFORMIN HCL 1000 MG PO TABS
1000.0000 mg | ORAL_TABLET | Freq: Two times a day (BID) | ORAL | 0 refills | Status: DC
Start: 2017-06-22 — End: 2020-01-22

## 2017-06-22 MED ORDER — THIAMINE HCL 100 MG PO TABS: 100.0000 mg | ORAL_TABLET | Freq: Every day | ORAL | 0 refills | Status: DC

## 2017-06-22 MED ORDER — GLIMEPIRIDE 1 MG PO TABS: 1.0000 mg | ORAL_TABLET | Freq: Every day | ORAL | 0 refills | Status: DC

## 2017-06-22 MED ORDER — LISINOPRIL 20 MG PO TABS: 20.0000 mg | ORAL_TABLET | Freq: Every day | ORAL | 0 refills | Status: DC

## 2017-06-22 MED ORDER — FOLIC ACID 1 MG PO TABS: 1.0000 mg | ORAL_TABLET | Freq: Every day | ORAL | 0 refills | Status: DC

## 2017-06-22 NOTE — Progress Notes (Signed)
Ryan Mcbride to be D/C'd Home per MD order.  Used [employer] video interpreter to discussed with the patient and all questions fully answered.  VSS, Skin clean, dry and intact without evidence of skin break down, no evidence of skin tears noted. IV catheter discontinued intact. Site without signs and symptoms of complications. Dressing and pressure applied.  An After Visit Summary was printed and given to the patient. Patient received prescription.  D/c education completed with patient/family including follow up instructions, medication list, d/c activities limitations if indicated, with other d/c instructions as indicated by MD - patient able to verbalize understanding, all questions fully answered.   Patient instructed to return to ED, call 911, or call MD for any changes in condition.   Patient escorted via WC, and D/C home via private auto.  Ryan Mcbride 06/22/2017 5:28 PM

## 2017-06-22 NOTE — Care Management Note (Signed)
Case Management Note  Patient Details  Name: Audi Wettstein MRN: [id number] Date of Birth: [birth year]  Subjective/Objective:       DKA             Action/Plan: Transition to home. Pt states friend to provide transportation to home. NCM provided pt with Match Letter to assist with medication needs, pt without insurance, no job, states no money and no one to help. NCM spoke with pt @ bedside  And provide pt with [facility] information. Pt states he will call [facility] on 06/27/2017 to scheduled hospital f/u appointment the patient states will not have any problems with transportation to clinic.  Expected Discharge Date:  06/22/17               Expected Discharge Plan:  Home/Self Care  In-House Referral:     Discharge planning Services  CM Consult, Indigent Health Clinic, [facility] Program  Post Acute Care Choice:    Choice offered to:     DME Arranged:   N/A DME Agency:   N/A  HH Arranged:   N/A HH Agency:   N/A  Status of Service:  Completed, signed off  If discussed at Long Length of Stay Meetings, dates discussed:    Additional Comments:  Epifanio Lesches, RN 06/22/2017, 3:33 PM

## 2017-06-22 NOTE — Progress Notes (Signed)
Physical Therapy Treatment Patient Details Name: Ryan Mcbride MRN: [id number] DOB: 28-May-1955 Today's Date: 06/22/2017    History of Present Illness Patient presents to ED after witnessed seizure activity. Reportedly patient was at a party with a friend during this event. Per EMS patient had one seizure prior to there arrival and an additional two in the truck. On arrival to ED patient had forth seizure. Glucose 535. PH 7.091.  Intubated for airway protection, self-extubated    PT Comments    Patient is making progress toward mobility goals. This session focused on gait training with SPC. Pt required supervision/min guard for OOB. Pt unsteady at times however demonstrated ability to recover without assistance. Continue to progress as tolerated.    Follow Up Recommendations  No PT follow up     Equipment Recommendations  Cane    Recommendations for Other Services       Precautions / Restrictions Precautions Precautions: Fall    Mobility  Bed Mobility Overal bed mobility: Modified Independent                Transfers Overall transfer level: Modified independent Equipment used: None Transfers: Sit to/from Stand           General transfer comment: increased time   Ambulation/Gait Ambulation/Gait assistance: Min guard;Supervision Ambulation Distance (Feet): 200 Feet Assistive device: Straight cane Gait Pattern/deviations: Step-through pattern;Drifts right/left Gait velocity: decreased   General Gait Details: pt unsteady with horizontal head turns and directional changes however demonstrated correct righting reactions and did not require assistance to recover; cues for sequencing of gait with use of AD   Stairs Stairs: Yes Stairs assistance: Supervision Stair Management: One rail Left;Alternating pattern;Forwards Number of Stairs: 10 General stair comments: supervision for safety   Wheelchair Mobility    Modified Rankin (Stroke Patients Only)        Balance Overall balance assessment: Needs assistance   Sitting balance-Leahy Scale: Good     Standing balance support: Single extremity supported;During functional activity Standing balance-Leahy Scale: Fair                              Cognition Arousal/Alertness: Awake/alert Behavior During Therapy: WFL for tasks assessed/performed Overall Cognitive Status: No family/caregiver present to determine baseline cognitive functioning Area of Impairment: Safety/judgement                         Safety/Judgement: Decreased awareness of deficits            Exercises      General Comments        Pertinent Vitals/Pain Pain Assessment: No/denies pain    Home Living                      Prior Function            PT Goals (current goals can now be found in the care plan section) Acute Rehab PT Goals Patient Stated Goal: wants to take better care of himself PT Goal Formulation: With patient Time For Goal Achievement: 07/05/17 Potential to Achieve Goals: Good Progress towards PT goals: Progressing toward goals    Frequency    Min 3X/week      PT Plan Current plan remains appropriate    Co-evaluation              AM-PAC PT "6 Clicks" Daily Activity  Outcome Measure  Difficulty turning over in bed (  including adjusting bedclothes, sheets and blankets)?: None Difficulty moving from lying on back to sitting on the side of the bed? : A Little Difficulty sitting down on and standing up from a chair with arms (e.g., wheelchair, bedside commode, etc,.)?: A Little Help needed moving to and from a bed to chair (including a wheelchair)?: A Little Help needed walking in hospital room?: A Little Help needed climbing 3-5 steps with a railing? : A Little 6 Click Score: 19    End of Session Equipment Utilized During Treatment: Gait belt Activity Tolerance: Patient tolerated treatment well Patient left: in chair;with call bell/phone  within reach Nurse Communication: Mobility status PT Visit Diagnosis: Unsteadiness on feet (R26.81)     Time: 1610-9604 PT Time Calculation (min) (ACUTE ONLY): 20 min  Charges:  $Gait Training: 8-22 mins                    G Codes:       Erline Levine, PTA Pager: [phone]     Carolynne Edouard 06/22/2017, 3:18 PM

## 2017-06-22 NOTE — Progress Notes (Signed)
Inpatient Diabetes Program Recommendations  AACE/ADA: New Consensus Statement on Inpatient Glycemic Control (2015)  Target Ranges:  Prepandial:   less than 140 mg/dL      Peak postprandial:   less than 180 mg/dL (1-2 hours)      Critically ill patients:  140 - 180 mg/dL   Results for ZAIM, NITTA (MRN [id number]) as of 06/22/2017 10:02  Ref. Range 06/21/2017 08:31 06/21/2017 12:56 06/21/2017 17:08 06/21/2017 21:59 06/22/2017 08:30  Glucose-Capillary Latest Ref Range: 65 - 99 mg/dL 409 (H) 811 (H) 914 (H) 242 (H) 204 (H)   Review of Glycemic Control  Diabetes history: DM2 Outpatient Diabetes medications: None - but use to take Glyburide 5 mg BID and Metformin 1000 mg BID Current orders for Inpatient glycemic control: Amaryl 1 mg QAM, Metformin 1000 mg BID, Novolog 0-9 units TID with meals, Novolog 0-5 units QHS, Novolog 4 units TID with meals for meal coverage  Inpatient Diabetes Program Recommendations:  Insulin - Meal Coverage: Please consider discontinuing Novolog 4 units TID with meals for meal coverage since Amaryl and Metformin has been started.  HgbA1C: A1C 10% on 06/19/17 indicating an average glucose of 240 mg/dl over the past 2-3 months. Would recommend ordering Amaryl 1 mg daily and Metformin 1000 mg BID and instruct patient to follow up with provider regarding DM management.  Note: Received a page from RN regarding whether to give Novolog correction and meal coverage this morning along with Amaryl and Metformin.  Asked that Amaryl, Metformin, and Novolog correction and meal coverage to be given as ordered this morning. Explained that request to discontinue Novolog meal coverage would be recommended.   Thanks, Orlando Penner, RN, MSN, CDE Diabetes Coordinator Inpatient Diabetes Program [phone] (Team Pager from 8am to 5pm)

## 2017-06-22 NOTE — Discharge Instructions (Addendum)
Please get your medications reviewed and adjusted by your Primary MD. ° °Please request your Primary MD to go over all Hospital Tests and Procedure/Radiological results at the follow up, please get all Hospital records sent to your Prim MD by signing hospital release before you go home. ° °If you had Pneumonia of Lung problems at the Hospital: °Please get a 2 view Chest X ray done in 6-8 weeks after hospital discharge or sooner if instructed by your Primary MD. ° °If you have Congestive Heart Failure: °Please call your Cardiologist or Primary MD anytime you have any of the following symptoms:  °1) 3 pound weight gain in 24 hours or 5 pounds in 1 week  °2) shortness of breath, with or without a dry hacking cough  °3) swelling in the hands, feet or stomach  °4) if you have to sleep on extra pillows at night in order to breathe ° °Follow cardiac low salt diet and 1.5 lit/day fluid restriction. ° °If you have diabetes °Accuchecks 4 times/day, Once in AM empty stomach and then before each meal. °Log in all results and show them to your primary doctor at your next visit. °If any glucose reading is under 80 or above 300 call your primary MD immediately. ° °If you have Seizure/Convulsions/Epilepsy: °Please do not drive, operate heavy machinery, participate in activities at heights or participate in high speed sports until you have seen by Primary MD or a Neurologist and advised to do so again. ° °If you had Gastrointestinal Bleeding: °Please ask your Primary MD to check a complete blood count within one week of discharge or at your next visit. Your endoscopic/colonoscopic biopsies that are pending at the time of discharge, will also need to followed by your Primary MD. ° °Get Medicines reviewed and adjusted. °Please take all your medications with you for your next visit with your Primary MD ° °Please request your Primary MD to go over all hospital tests and procedure/radiological results at the follow up, please ask your  Primary MD to get all Hospital records sent to his/her office. ° °If you experience worsening of your admission symptoms, develop shortness of breath, life threatening emergency, suicidal or homicidal thoughts you must seek medical attention immediately by calling 911 or calling your MD immediately  if symptoms less severe. ° °You must read complete instructions/literature along with all the possible adverse reactions/side effects for all the Medicines you take and that have been prescribed to you. Take any new Medicines after you have completely understood and accpet all the possible adverse reactions/side effects.  ° °Do not drive or operate heavy machinery when taking Pain medications.  ° °Do not take more than prescribed Pain, Sleep and Anxiety Medications ° °Special Instructions: If you have smoked or chewed Tobacco  in the last 2 yrs please stop smoking, stop any regular Alcohol  and or any Recreational drug use. ° °Wear Seat belts while driving. ° °Please note °You were cared for by a hospitalist during your hospital stay. If you have any questions about your discharge medications or the care you received while you were in the hospital after you are discharged, you can call the unit and asked to speak with the hospitalist on call if the hospitalist that took care of you is not available. Once you are discharged, your primary care physician will handle any further medical issues. Please note that NO REFILLS for any discharge medications will be authorized once you are discharged, as it is imperative that you   return to your primary care physician (or establish a relationship with a primary care physician if you do not have one) for your aftercare needs so that they can reassess your need for medications and monitor your lab values.  You can reach the hospitalist office at phone [phone] or fax [phone]   If you do not have a primary care physician, you can call [phone] for a physician  referral.     Cetoacidosis diabtica (Diabetic Ketoacidosis) La cetoacidosis diabtica es una complicacin potencialmente mortal de la diabetes. Si no se trata, puede causar deshidratacin grave y [employer] rganos, y llevar al coma o a la muerte. CAUSAS Esta afeccin aparece cuando el cuerpo no tiene la cantidad suficiente de la hormona insulina. La insulina ayuda a que el cuerpo degrade el azcar para Psychiatrist. Sin insulina, el cuerpo no Magazine features editor, por lo que en su lugar degrada las grasas. Esto ocasiona la produccin de unos cidos llamados cetonas. En niveles altos, las cetonas son txicas. Los factores desencadenantes de esta afeccin son los siguientes:  Neurosurgeon debido a una enfermedad.  Medicamentos que [employer] niveles sanguneos de [place].  No tomar medicamentos para la diabetes. SNTOMAS Los sntomas de esta afeccin incluyen lo siguiente:  [place].  Prdida de peso.  Sed excesiva.  Sensacin de desvanecimiento.  Aliento con [employer] a fruta o [place].  Miccin excesiva  Cambios en la visin.  Confusin o irritabilidad.  Nuseas.  Vmitos.  Respiracin rpida.  Dolor abdominal.  Sensacin de sofoco. DIAGNSTICO Esta afeccin se diagnostica mediante la historia clnica, un examen fsico y [place] de [place]. Tambin pueden hacerle un anlisis de orina para Engineer, manufacturing la presencia de cetonas. TRATAMIENTO El tratamiento de esta afeccin puede incluir lo siguiente:  Reposicin de lquidos. Puede realizarse como solucin para la deshidratacin.  Inyecciones de insulina. Pueden administrarse a travs de la piel o mediante una va intravenosa (IV).  Reposicin de electrolitos. Los electrolitos, como el potasio y [place], pueden administrarse en forma de comprimidos o a travs de una va intravenosa (IV).  Antibiticos. Pueden recetarle antibiticos si el trastorno se debe a una infeccin. INSTRUCCIONES PARA EL CUIDADO EN EL  HOGAR Comida y bebida  [place] suficiente lquido para mantener la orina clara o de color amarillo plido.  Si no puede comer, alterne [employer] de lquidos azucarados (como jugos) y lquidos salados (como caldos o consoms).  Si puede comer, siga su dieta habitual y beba lquidos sin azcar, como agua. Otras indicaciones  Adminstrese la [employer] se lo haya indicado el mdico. No omita ninguna de las inyecciones de [place]. No se administre insulina que haya vencido.  Si el azcar en la sangre es superior a /dl, supervise las cetonas en la orina cada 4a 6horas.  Si le recetaron antibiticos, asegrese de terminarlos, incluso si comienza a Actor.  Haga reposo y ejercicios solamente como se lo haya indicado el mdico.  Si se descompone, llame al mdico y comience el tratamiento de inmediato. El cuerpo suele necesitar insulina adicional para luchar contra una enfermedad.  Controle sus niveles sanguneos de glucosa con regularidad. Si el nivel sanguneo de glucosa es elevado, beba gran cantidad de lquidos. Esto ayuda a [employer]. SOLICITE ATENCIN MDICA SI:  El nivel sanguneo de glucosa es muy alto o [place].  Tiene cetonas en la orina.  Tiene fiebre.  No puede comer.  No puede tolerar los lquidos.  Ha vomitado durante ms de 2 horas.  Sigue teniendo sntomas de Copy.  Presenta nuevos sntomas.  SOLICITE ATENCIN MDICA DE INMEDIATO SI:  Los niveles sanguneos de glucosa siguen estando altos (elevados).  El monitor indica un nivel alto incluso cuando se est administrando insulina.  Se desmaya.  Siente dolor en el pecho.  Tiene dificultad para respirar.  Sufre una cefalea intensa.  Siente debilidad repentina en un brazo o una pierna.  Tiene problemas repentinos para hablar o tragar.  Tiene vmitos o diarrea que empeoran despus de 3horas.  Siente mucha fatiga.  Tiene dificultad para pensar.  Siente  dolor abdominal.  Est muy deshidratado. Los sntomas de deshidratacin intensa incluyen lo siguiente: ? Sed extrema. ? Sequedad en la boca. ? Labios azules. ? Manos y pies fros. ? Respiracin rpida.  Esta informacin no tiene Theme park manager el consejo del mdico. Asegrese de hacerle al mdico cualquier pregunta que tenga. Document Released: 02/08/2005 Document Revised: 06/02/2015 Document Reviewed: 01/16/2014 Elsevier Interactive Patient Education  2017 [employer].

## 2017-06-22 NOTE — Discharge Summary (Addendum)
Physician Discharge Summary  Ryan Mcbride EAV:[id number] DOB: 07-30-1955  PCP: Patient, [name]  Admit date: 06/18/2017 Discharge date: 06/22/2017  Recommendations for Outpatient Follow-up:  1. [facility] in [place]/PCP in 5 days with repeat labs (CBC & BMP).  Home Health: None Equipment/Devices: None  Discharge Condition: Improved and stable CODE STATUS: Full Diet recommendation: Heart healthy & diabetic diet.  Discharge Diagnoses:  Active Problems:   DKA (diabetic ketoacidoses) (HCC)   Seizure [facility])   Brief Summary: 62 year old Spanish-speaking male with PMH of DM 2, HTN, prior alcohol abuse (quit 03/2017), quit smoking recently, recently amputated right fourth and fifth toes, lives with a friend and independent of activities, presented to [facility] ED via EMS with multiple seizures.  In ED, blood glucose 535, pH 7.091.  In the ED he became combative, agitated and pulling out lines.  He was placed on Precedex drip for airway protection, started on IV Keppra, admitted to ICU by CCM and neurology was consulted.  After stabilization, patient's care was transferred to the floor and the hospitalist service.  Assessment and plan:  1. DKA and type II DM: Patient reports being diagnosed with diabetes a couple months ago but noncompliant with medications due to some nonspecific side effects that he is unable to clearly elaborate.  He was treated per DKA protocol which resolved.  Diabetes coordinator was consulted.  A1c 9.8.  Patient declines insulins at discharge.  He was started on Amaryl 1 mg daily and Metformin 1000 mg twice daily.  His glycemic control has improved but not yet optimal.  He has been strongly counseled by multiple medical personnel including diabetes coordinator, nursing and me regarding importance of compliance with all aspects of his diabetes and medical care.  He is advised to follow-up with PCP next week to adjust medications as needed.  He verbalized understanding. 2. Seizure  disorder: Neurology was consulted and underwent evaluation.  MRI and EEG did not show definitive seizure focus.  As per neurology, seizures were felt to be related to his hyperglycemia.  This being a first-time seizure and possibly provoked, they advised no AEDs and empirically started Keppra was discontinued.  Seizure precautions including no driving was repeatedly counseled.  No further seizures. 3. Essential hypertension: Not sure if he was compliant with his lisinopril PTA.  Blood pressures elevated as below.  Resumed lisinopril at discharge. 4. History of alcohol abuse: Patient confirmed that he quit drinking in February 2019.  Congratulated and encouraged him to continue abstinence.  Continue thiamine, multivitamins. 5. History of tobacco abuse: Cessation counseled. 6. 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  Site appears to be healing well without acute findings. 7. Acute respiratory failure with hypoxia: Patient was intubated for airway protection.  Extubated 06/20/2017.  Hypoxia resolved. 8. Hypomagnesemia/hypokalemia: Resolved. 9. Acute toxic metabolic encephalopathy: Secondary to DKA and postictal state from seizures.  Although alcohol withdrawal and substance abuse were on differentials, eventually felt less likely.  UDS positive only for benzodiazepines received in ED.  Resolved. 10. Acute kidney injury: Resolved. 11. Thrombocytopenia: Resolved.     Consultations:  PCCM  Neuro hospitalist  Procedures:  Intubation/extubation   Discharge Instructions  Discharge Instructions    Call MD for:  difficulty breathing, headache or visual disturbances   Complete by:  As directed    Call MD for:  extreme fatigue   Complete by:  As directed    Call MD for:  persistant dizziness or light-headedness   Complete by:  As directed    Call MD for:  persistant nausea and vomiting   Complete by:  As directed    Diet - low sodium heart healthy    Complete by:  As directed    Diet Carb Modified   Complete by:  As directed    Discharge instructions   Complete by:  As directed    Patient is unable to drive, operate heavy machinery, perform activities at heights or participate in water activities until release by outpatient physician. This was discussed with the patient who expressed understanding.   Increase activity slowly   Complete by:  As directed        Medication List    STOP taking these medications   glyBURIDE 5 MG tablet Commonly known as:  DIABETA   ibuprofen 600 MG tablet Commonly known as:  ADVIL,MOTRIN     TAKE these medications   folic acid 1 MG tablet Commonly known as:  FOLVITE Take 1 tablet (1 mg total) by mouth daily. Start taking on:  06/23/2017   glimepiride 1 MG tablet Commonly known as:  AMARYL Take 1 tablet (1 mg total) by mouth daily with breakfast. Start taking on:  06/23/2017   lisinopril 20 MG tablet Commonly known as:  PRINIVIL,ZESTRIL Take 1 tablet (20 mg total) by mouth daily.   metFORMIN 1000 MG tablet Commonly known as:  GLUCOPHAGE Take 1 tablet (1,000 mg total) by mouth 2 (two) times daily with a meal.   multivitamin with minerals Tabs tablet Take 1 tablet by mouth daily. Start taking on:  06/23/2017   thiamine 100 MG tablet Take 1 tablet (100 mg total) by mouth daily. Start taking on:  06/23/2017      Follow-up Information    [facility] in [place]. Schedule an appointment as soon as possible for a visit in 5 day(s).   Why:  Mon - Thurs before 4pm or before 11:30 am on Fri and request Primary Care Physician appointment. To be seen with labs (CBC & BMP   De lunes a jueves antes de las 4 pm o antes de las 11:30 am del viernes y solicite una cita con el mdico de atencin primari Contact information: [address], [place], [place] [address]  [phone]         No Known Allergies    Procedures/Studies: Ct Head Wo Contrast  Result Date: 06/18/2017 CLINICAL DATA:   Seizures.  Incontinent of urine. EXAM: CT HEAD WITHOUT CONTRAST TECHNIQUE: Contiguous axial images were obtained from the base of the skull through the vertex without intravenous contrast. COMPARISON:  None. FINDINGS: Brain: Ventricles, cisterns and other CSF spaces are within normal. There is no mass, mass effect, shift of midline structures or acute hemorrhage. No evidence of acute infarction. Vascular: No hyperdense vessel or unexpected calcification. Skull: Possible subtle fracture along the right-sided nasal bone which may be acute or chronic. Sinuses/Orbits: Minimal mucosal membrane thickening over the floor the left maxillary sinus. Orbits are normal. Mild opacification over the inferior right mastoid air cells. Other: None. IMPRESSION: No acute intracranial findings. Subtle fracture along the right-sided nasal bone which may be acute or chronic. Minimal chronic sinus inflammatory change. Electronically Signed   By: Elberta Fortis M.D.   On: 06/18/2017 20:35   Mr Brain Wo Contrast  Result Date: 06/20/2017 CLINICAL DATA:  62 y/o  M; seizure. EXAM: MRI HEAD WITHOUT CONTRAST TECHNIQUE: Multiplanar, multiecho pulse sequences of the brain and surrounding structures were obtained without intravenous contrast. COMPARISON:  06/18/2017 CT head. FINDINGS: Brain: Motion degradation on multiple sequences. No acute infarction, hemorrhage, hydrocephalus, extra-axial collection or mass lesion. Hippocampi are symmetric in size and signal bilaterally. No gross structural abnormality of the brain. Vascular: Normal flow voids. Skull and upper cervical spine: Normal marrow signal. Sinuses/Orbits: Mild left maxillary sinus mucosal thickening. Trace right mastoid effusion. Orbits are unremarkable. Other: None. IMPRESSION: 1. Motion degraded study. 2. No structural cause of seizure identified. 3. No acute intracranial abnormality. Electronically Signed   By: Mitzi Hansen M.D.   On: 06/20/2017 17:49   Dg Chest Port  1 View  Result Date: 06/20/2017 CLINICAL DATA:  Endotracheal tube. EXAM: PORTABLE CHEST 1 VIEW COMPARISON:  Radiograph of June 19, 2017. FINDINGS: Stable cardiomediastinal silhouette. Endotracheal and nasogastric tubes are unchanged in position. No pneumothorax is noted. Stable bibasilar subsegmental atelectasis. Minimal right pleural effusion is noted. Bony thorax is unremarkable. IMPRESSION: Stable support apparatus. Stable bibasilar subsegmental atelectasis. Stable minimal right pleural effusion. Electronically Signed   By: Lupita Raider, M.D.   On: 06/20/2017 07:36   Dg Chest Portable 1 View  Result Date: 06/19/2017 CLINICAL DATA:  Post intubation and gastric tube placement. EXAM: PORTABLE CHEST 1 VIEW COMPARISON:  06/18/2017 FINDINGS: Endotracheal tube tip is 2 cm above the carina. Gastric tube tip and side-port are seen coiled within the stomach, the tip near the expected location of the gastric fundus. Low lung volumes with atelectasis. Cardiomegaly is stable. There is mild aortic atherosclerosis. Retrocardiac infiltrate and/or atelectasis with air bronchograms noted at the left lung base. IMPRESSION: Satisfactory endotracheal and gastric tube positions. Low lung volumes with bibasilar atelectasis. Slightly more confluent airspace opacity with air bronchograms at the left lung base, suspicious for pneumonia and/or atelectasis. Electronically Signed   By: Tollie Eth M.D.   On: 06/19/2017 01:17   Dg Chest Port 1 View  Result Date: 06/18/2017 CLINICAL DATA:  ALOC, seizures. Patient with seizures, has had 3 in the last hour, two in the presence of EMS. Patient is incontinent of urine. Patient's pupil are unequal and CBG of 535. No known heart or lung problems. Non-smoker. EXAM: PORTABLE CHEST 1 VIEW COMPARISON:  12/20/2014 FINDINGS: Study somewhat limited by the supine portable technique and rotation of the patient to the right. Cardiac silhouette is top-normal in size to mildly enlarged. No  convincing mediastinal or hilar masses. Lungs are clear.  No convincing pleural effusion.  No pneumothorax. Skeletal structures are grossly intact. IMPRESSION: No active disease. Electronically Signed   By: Amie Portland M.D.   On: 06/18/2017 20:58      Subjective: Patient was interviewed and examined using the video interpreter [phone]).  Patient denies complaints.  Denies dyspnea, pain, nausea or vomiting.  Reports that he stopped taking all his medications approximately 3 weeks ago due to some "shaking" but unable to elaborate further.  States that he used to drink 12 beers per day but stopped in February 2019 and also quit smoking.    Discharge Exam:  Vitals:   06/21/17 1736 06/21/17 2159 06/22/17 0531 06/22/17 1350  BP: (!) 156/86 (!) 159/89 (!) 148/93 (!) 164/98  Pulse: 86 80 76 88  Resp: (!) 25 18 20 16   Temp: 98 F (36.7 C) 98.8 F (37.1 C) 99 F (37.2 C)   TempSrc: Oral Oral Oral   SpO2: 98% 97% 99% 96%  Weight:      Height:        General: Pleasant middle-aged male, moderately built and nourished, sitting up comfortably  in bed. Cardiovascular: S1 & S2 heard, RRR, S1/S2 +. No murmurs, rubs, gallops or clicks. No JVD or pedal edema.  Telemetry personally reviewed: SB in the high 50s-SR. Respiratory: Clear to auscultation without wheezing, rhonchi or crackles. No increased work of breathing. Abdominal:  Non distended, non tender & soft. No organomegaly or masses appreciated. Normal bowel sounds heard. CNS: Alert and oriented. No focal deficits. Extremities: no edema, no cyanosis.  Amputated right fourth and fifth toes.  Healing scab at amputated site without acute findings.    The results of significant diagnostics from this hospitalization (including imaging, microbiology, ancillary and laboratory) are listed below for reference.     Microbiology: Recent Results (from the past 240 hour(s))  Culture, blood (routine x 2)     Status: None (Preliminary result)    Collection Time: 06/19/17 12:05 AM  Result Value Ref Range Status   Specimen Description BLOOD LEFT FOREARM  Final   Special Requests   Final    BOTTLES DRAWN AEROBIC AND ANAEROBIC Blood Culture results may not be optimal due to an inadequate volume of blood received in culture bottles   Culture   Final    NO GROWTH 3 DAYS Performed at [facility], [address] [place], [place] [address]    Report Status PENDING  Incomplete  MRSA PCR Screening     Status: None   Collection Time: 06/19/17  1:45 AM  Result Value Ref Range Status   MRSA by PCR NEGATIVE NEGATIVE Final    Comment:        The GeneXpert MRSA Assay (FDA approved for NASAL specimens only), is one component of a comprehensive MRSA colonization surveillance program. It is not intended to diagnose MRSA infection nor to guide or monitor treatment for MRSA infections. Performed at [facility], [address] [place], [place] [address]   Culture, blood (routine x 2)     Status: None (Preliminary result)   Collection Time: 06/19/17  4:42 AM  Result Value Ref Range Status   Specimen Description BLOOD RIGHT ARM  Final   Special Requests   Final    BOTTLES DRAWN AEROBIC AND ANAEROBIC Blood Culture adequate volume   Culture   Final    NO GROWTH 3 DAYS Performed at [facility], [address] [place], [place] [address]    Report Status PENDING  Incomplete  Culture, respiratory (NON-Expectorated)     Status: None   Collection Time: 06/19/17  6:08 AM  Result Value Ref Range Status   Specimen Description TRACHEAL ASPIRATE  Final   Special Requests NONE  Final   Gram Stain   Final    NO SQUAMOUS EPITHELIAL CELLS PRESENT FEW WBC PRESENT,BOTH PMN AND MONONUCLEAR FEW YEAST MODERATE GRAM NEGATIVE COCCI ABUNDANT GRAM POSITIVE COCCI IN PAIRS AND CHAINS FEW GRAM POSITIVE RODS    Culture   Final    MODERATE Consistent with normal respiratory flora. Performed at [facility], [address]  [place], [place] [address]    Report Status 06/21/2017 FINAL  Final     Labs: CBC: Recent Labs  Lab 06/18/17 1930 06/20/17 0648 06/21/17 0407 06/22/17 0547  WBC 10.9* 9.9 7.6 6.8  NEUTROABS 5.7  --   --  4.4  HGB 15.4 13.1 12.6* 12.4*  HCT 44.6 38.4* 36.0* 35.5*  MCV 85.4 84.4 82.4 81.8  PLT 225 145* 140* 152   Basic Metabolic Panel: Recent Labs  Lab 06/18/17 2335 06/19/17 0444 06/19/17  0538 06/20/17 0648 06/21/17 0407 06/22/17 0547  NA 138 141 139 140 136 137  K 3.9 4.3 4.2 3.9 3.7 3.8  CL 105 112* 110 112* 104 102  CO2 21* GLUCOSE 289* 113* 109* 136* 180* 186*  BUN CREATININE 1.02 0.80 0.78 0.90 0.92 0.86  CALCIUM 9.0 7.8* 7.8* 7.9* 7.9* 8.2*  MG 2.3  --   --  1.6*  --  1.8  PHOS 3.0  --   --  3.3  --   --    Liver Function Tests: Recent Labs  Lab 06/18/17 1930  AST 36  ALT <5*  ALKPHOS 154*  BILITOT 0.7  PROT 7.5  ALBUMIN 4.0   Cardiac Enzymes: Recent Labs  Lab 06/18/17 2335 06/19/17 0444 06/19/17 1309  CKTOTAL  --  178  --   TROPONINI <0.03 <0.03 <0.03   CBG: Recent Labs  Lab 06/21/17 1256 06/21/17 1708 06/21/17 2159 06/22/17 0830 06/22/17 1234  GLUCAP 203* 259* 242* 204* 190*   Hgb A1c Recent Labs    06/22/17 0547  HGBA1C 9.8*   Urinalysis    Component Value Date/Time   COLORURINE STRAW (A) 06/18/2017 2333   APPEARANCEUR CLEAR 06/18/2017 2333   LABSPEC 1.026 06/18/2017 2333   PHURINE 6.0 06/18/2017 2333   GLUCOSEU >=500 (A) 06/18/2017 2333   HGBUR NEGATIVE 06/18/2017 2333   BILIRUBINUR NEGATIVE 06/18/2017 2333   KETONESUR 5 (A) 06/18/2017 2333   PROTEINUR NEGATIVE 06/18/2017 2333   NITRITE NEGATIVE 06/18/2017 2333   LEUKOCYTESUR NEGATIVE 06/18/2017 2333      Time coordinating discharge: 50 minutes  SIGNED:  Marcellus Scott, MD, FACP, [facility]. [facility] Pager [phone]  If 7PM-7AM, please contact night-coverage [email or web address] Password [email or web address] 06/22/2017, 2:23 PM

## 2017-06-24 LAB — CULTURE, BLOOD (ROUTINE X 2)
CULTURE: NO GROWTH
Culture: NO GROWTH
SPECIAL REQUESTS: ADEQUATE

## 2017-07-04 LAB — MISC LABCORP TEST (SEND OUT): LABCORP TEST CODE: 790697

## 2019-07-13 DIAGNOSIS — E871 Hypo-osmolality and hyponatremia: Secondary | ICD-10-CM

## 2019-07-13 DIAGNOSIS — R29810 Facial weakness: Secondary | ICD-10-CM

## 2019-07-13 DIAGNOSIS — E1169 Type 2 diabetes mellitus with other specified complication: Secondary | ICD-10-CM

## 2019-07-14 DIAGNOSIS — G510 Bell's palsy: Secondary | ICD-10-CM

## 2020-01-18 ENCOUNTER — Emergency Department (HOSPITAL_COMMUNITY): Payer: Self-pay

## 2020-01-18 ENCOUNTER — Encounter (HOSPITAL_COMMUNITY): Payer: Self-pay | Admitting: Emergency Medicine

## 2020-01-18 ENCOUNTER — Encounter (HOSPITAL_COMMUNITY)
Admission: EM | Disposition: A | Discharge status: Home / Self Care | Payer: Self-pay | Source: Home / Self Care | Attending: Internal Medicine

## 2020-01-18 ENCOUNTER — Other Ambulatory Visit: Payer: Self-pay

## 2020-01-18 ENCOUNTER — Inpatient Hospital Stay (HOSPITAL_COMMUNITY)
Admission: EM | Admit: 2020-01-18 | Discharge: 2020-01-22 | DRG: 580 | Disposition: A | Discharge status: Home / Self Care | Payer: Self-pay | Attending: Internal Medicine | Admitting: Internal Medicine

## 2020-01-18 ENCOUNTER — Emergency Department (HOSPITAL_COMMUNITY): Payer: Self-pay | Admitting: Anesthesiology

## 2020-01-18 DIAGNOSIS — S62609A Fracture of unspecified phalanx of unspecified finger, initial encounter for closed fracture: Secondary | ICD-10-CM | POA: Diagnosis present

## 2020-01-18 DIAGNOSIS — Z7984 Long term (current) use of oral hypoglycemic drugs: Secondary | ICD-10-CM

## 2020-01-18 DIAGNOSIS — Z20822 Contact with and (suspected) exposure to covid-19: Secondary | ICD-10-CM | POA: Diagnosis present

## 2020-01-18 DIAGNOSIS — F1010 Alcohol abuse, uncomplicated: Secondary | ICD-10-CM | POA: Diagnosis present

## 2020-01-18 DIAGNOSIS — S62616A Displaced fracture of proximal phalanx of right little finger, initial encounter for closed fracture: Secondary | ICD-10-CM | POA: Diagnosis present

## 2020-01-18 DIAGNOSIS — I10 Essential (primary) hypertension: Secondary | ICD-10-CM | POA: Diagnosis present

## 2020-01-18 DIAGNOSIS — B9561 Methicillin susceptible Staphylococcus aureus infection as the cause of diseases classified elsewhere: Secondary | ICD-10-CM | POA: Diagnosis present

## 2020-01-18 DIAGNOSIS — E1151 Type 2 diabetes mellitus with diabetic peripheral angiopathy without gangrene: Secondary | ICD-10-CM | POA: Diagnosis present

## 2020-01-18 DIAGNOSIS — R569 Unspecified convulsions: Secondary | ICD-10-CM | POA: Diagnosis present

## 2020-01-18 DIAGNOSIS — L089 Local infection of the skin and subcutaneous tissue, unspecified: Secondary | ICD-10-CM

## 2020-01-18 DIAGNOSIS — Z9119 Patient's noncompliance with other medical treatment and regimen: Secondary | ICD-10-CM

## 2020-01-18 DIAGNOSIS — L03011 Cellulitis of right finger: Principal | ICD-10-CM | POA: Diagnosis present

## 2020-01-18 DIAGNOSIS — Z79899 Other long term (current) drug therapy: Secondary | ICD-10-CM

## 2020-01-18 DIAGNOSIS — Z89421 Acquired absence of other right toe(s): Secondary | ICD-10-CM

## 2020-01-18 DIAGNOSIS — E871 Hypo-osmolality and hyponatremia: Secondary | ICD-10-CM | POA: Diagnosis present

## 2020-01-18 DIAGNOSIS — M7989 Other specified soft tissue disorders: Secondary | ICD-10-CM | POA: Diagnosis present

## 2020-01-18 DIAGNOSIS — Z89512 Acquired absence of left leg below knee: Secondary | ICD-10-CM

## 2020-01-18 DIAGNOSIS — S022XXA Fracture of nasal bones, initial encounter for closed fracture: Secondary | ICD-10-CM | POA: Diagnosis present

## 2020-01-18 DIAGNOSIS — S0011XA Contusion of right eyelid and periocular area, initial encounter: Secondary | ICD-10-CM | POA: Diagnosis present

## 2020-01-18 DIAGNOSIS — R739 Hyperglycemia, unspecified: Secondary | ICD-10-CM

## 2020-01-18 DIAGNOSIS — Y92009 Unspecified place in unspecified non-institutional (private) residence as the place of occurrence of the external cause: Secondary | ICD-10-CM

## 2020-01-18 DIAGNOSIS — W19XXXA Unspecified fall, initial encounter: Secondary | ICD-10-CM | POA: Diagnosis present

## 2020-01-18 DIAGNOSIS — E1165 Type 2 diabetes mellitus with hyperglycemia: Secondary | ICD-10-CM | POA: Diagnosis present

## 2020-01-18 HISTORY — PX: I & D EXTREMITY: SHX5045

## 2020-01-18 LAB — COMPREHENSIVE METABOLIC PANEL
ALT: 21 U/L (ref 0–44)
AST: 21 U/L (ref 15–41)
Albumin: 3.6 g/dL (ref 3.5–5.0)
Alkaline Phosphatase: 97 U/L (ref 38–126)
Anion gap: 12 (ref 5–15)
BUN: 13 mg/dL (ref 8–23)
CO2: 23 mmol/L (ref 22–32)
Calcium: 9 mg/dL (ref 8.9–10.3)
Chloride: 97 mmol/L — ABNORMAL LOW (ref 98–111)
Creatinine, Ser: 1.01 mg/dL (ref 0.61–1.24)
GFR, Estimated: >60 mL/min (ref >60)
Glucose, Bld: 377 mg/dL — ABNORMAL HIGH (ref 70–99)
Potassium: 3.8 mmol/L (ref 3.5–5.1)
Sodium: 132 mmol/L — ABNORMAL LOW (ref 135–145)
Total Bilirubin: 1 mg/dL (ref 0.3–1.2)
Total Protein: 7.1 g/dL (ref 6.5–8.1)

## 2020-01-18 LAB — CBC WITH DIFFERENTIAL/PLATELET
Abs Immature Granulocytes: 0.02 10*3/uL (ref 0.00–0.07)
Basophils Absolute: 0 10*3/uL (ref 0.0–0.1)
Basophils Relative: 1 %
Eosinophils Absolute: 0 10*3/uL (ref 0.0–0.5)
Eosinophils Relative: 1 %
HCT: 42.8 % (ref 39.0–52.0)
Hemoglobin: 14.7 g/dL (ref 13.0–17.0)
Immature Granulocytes: 0 %
Lymphocytes Relative: 12 %
Lymphs Abs: 0.9 10*3/uL (ref 0.7–4.0)
MCH: 30.9 pg (ref 26.0–34.0)
MCHC: 34.3 g/dL (ref 30.0–36.0)
MCV: 89.9 fL (ref 80.0–100.0)
Monocytes Absolute: 0.8 10*3/uL (ref 0.1–1.0)
Monocytes Relative: 10 %
Neutro Abs: 5.8 10*3/uL (ref 1.7–7.7)
Neutrophils Relative %: 76 %
Platelets: 210 10*3/uL (ref 150–400)
RBC: 4.76 MIL/uL (ref 4.22–5.81)
RDW: 11.9 % (ref 11.5–15.5)
WBC: 7.7 10*3/uL (ref 4.0–10.5)
nRBC: 0 % (ref 0.0–0.2)

## 2020-01-18 LAB — RESP PANEL BY RT-PCR (FLU A&B, COVID) ARPGX2
Influenza A by PCR: NEGATIVE
Influenza B by PCR: NEGATIVE
SARS Coronavirus 2 by RT PCR: NEGATIVE

## 2020-01-18 LAB — GLUCOSE, CAPILLARY
Glucose-Capillary: 246 mg/dL — ABNORMAL HIGH (ref 70–99)
Glucose-Capillary: 216 mg/dL — ABNORMAL HIGH (ref 70–99)

## 2020-01-18 SURGERY — IRRIGATION AND DEBRIDEMENT EXTREMITY
Anesthesia: General | Site: Finger | Laterality: Right

## 2020-01-18 MED ORDER — ACETAMINOPHEN 325 MG PO TABS: 650.0000 mg | ORAL_TABLET | Freq: Four times a day (QID) | ORAL | Status: DC | PRN

## 2020-01-18 MED ORDER — ACETAMINOPHEN 650 MG RE SUPP: 650.0000 mg | Freq: Four times a day (QID) | RECTAL | Status: DC | PRN

## 2020-01-18 MED ORDER — THIAMINE HCL 100 MG PO TABS
100.0000 mg | ORAL_TABLET | Freq: Every day | ORAL | Status: DC
  Administered 2020-01-20 – 2020-01-22 (×3): 100 mg via ORAL
  Filled 2020-01-18 (×4): qty 1

## 2020-01-18 MED ORDER — INSULIN ASPART 100 UNIT/ML ~~LOC~~ SOLN
4.0000 [IU] | Freq: Three times a day (TID) | SUBCUTANEOUS | Status: DC
  Filled 2020-01-18: qty 0.04

## 2020-01-18 MED ORDER — INSULIN ASPART 100 UNIT/ML ~~LOC~~ SOLN
SUBCUTANEOUS | Status: DC | PRN
  Administered 2020-01-18: 5 [IU] via SUBCUTANEOUS

## 2020-01-18 MED ORDER — PHENYLEPHRINE HCL (PRESSORS) 10 MG/ML IV SOLN
INTRAVENOUS | Status: DC | PRN
  Administered 2020-01-18: 80 ug via INTRAVENOUS

## 2020-01-18 MED ORDER — SODIUM CHLORIDE 0.9 % IV SOLN
1.0000 g | Freq: Once | INTRAVENOUS | Status: AC
  Administered 2020-01-18: 1 g via INTRAVENOUS
  Filled 2020-01-18: qty 10

## 2020-01-18 MED ORDER — ONDANSETRON HCL 4 MG PO TABS: 4.0000 mg | ORAL_TABLET | Freq: Four times a day (QID) | ORAL | Status: DC | PRN

## 2020-01-18 MED ORDER — PROPOFOL 10 MG/ML IV BOLUS
INTRAVENOUS | Status: AC
  Filled 2020-01-18: qty 40

## 2020-01-18 MED ORDER — INSULIN ASPART 100 UNIT/ML ~~LOC~~ SOLN
SUBCUTANEOUS | Status: AC
  Filled 2020-01-18: qty 1

## 2020-01-18 MED ORDER — MIDAZOLAM HCL 2 MG/2ML IJ SOLN: 0.5000 mg | Freq: Once | INTRAMUSCULAR | Status: DC | PRN

## 2020-01-18 MED ORDER — OXYCODONE HCL 5 MG PO TABS: 5.0000 mg | ORAL_TABLET | Freq: Once | ORAL | Status: DC | PRN

## 2020-01-18 MED ORDER — MEPERIDINE HCL 25 MG/ML IJ SOLN: 6.2500 mg | INTRAMUSCULAR | Status: DC | PRN

## 2020-01-18 MED ORDER — ADULT MULTIVITAMIN W/MINERALS CH
1.0000 | ORAL_TABLET | Freq: Every day | ORAL | Status: DC
  Administered 2020-01-19 – 2020-01-22 (×4): 1 via ORAL
  Filled 2020-01-18 (×4): qty 1

## 2020-01-18 MED ORDER — THIAMINE HCL 100 MG/ML IJ SOLN
100.0000 mg | Freq: Every day | INTRAMUSCULAR | Status: DC
  Administered 2020-01-19: 100 mg via INTRAVENOUS
  Filled 2020-01-18: qty 2

## 2020-01-18 MED ORDER — CEFAZOLIN SODIUM 1 G IJ SOLR
INTRAMUSCULAR | Status: AC
  Filled 2020-01-18: qty 20

## 2020-01-18 MED ORDER — POLYETHYLENE GLYCOL 3350 17 G PO PACK: 17.0000 g | PACK | Freq: Every day | ORAL | Status: DC | PRN

## 2020-01-18 MED ORDER — OXYCODONE-ACETAMINOPHEN 5-325 MG PO TABS
1.0000 | ORAL_TABLET | ORAL | Status: DC | PRN
  Administered 2020-01-19: 1 via ORAL
  Filled 2020-01-18: qty 1

## 2020-01-18 MED ORDER — ONDANSETRON HCL 4 MG/2ML IJ SOLN: 4.0000 mg | Freq: Four times a day (QID) | INTRAMUSCULAR | Status: DC | PRN

## 2020-01-18 MED ORDER — FENTANYL CITRATE (PF) 100 MCG/2ML IJ SOLN: 25.0000 ug | INTRAMUSCULAR | Status: DC | PRN

## 2020-01-18 MED ORDER — INSULIN ASPART 100 UNIT/ML ~~LOC~~ SOLN
0.0000 [IU] | Freq: Three times a day (TID) | SUBCUTANEOUS | Status: DC
  Administered 2020-01-19: 8 [IU] via SUBCUTANEOUS
  Administered 2020-01-19: 5 [IU] via SUBCUTANEOUS
  Administered 2020-01-19: 3 [IU] via SUBCUTANEOUS
  Administered 2020-01-20 (×2): 8 [IU] via SUBCUTANEOUS
  Administered 2020-01-20: 2 [IU] via SUBCUTANEOUS
  Administered 2020-01-20: 3 [IU] via SUBCUTANEOUS

## 2020-01-18 MED ORDER — LACTATED RINGERS IV SOLN: INTRAVENOUS | Status: DC

## 2020-01-18 MED ORDER — BUPIVACAINE HCL (PF) 0.25 % IJ SOLN
INTRAMUSCULAR | Status: DC | PRN
  Administered 2020-01-18: 10 mL

## 2020-01-18 MED ORDER — SODIUM CHLORIDE 0.9 % IV SOLN: INTRAVENOUS | Status: DC

## 2020-01-18 MED ORDER — INSULIN GLARGINE 100 UNIT/ML ~~LOC~~ SOLN
15.0000 [IU] | Freq: Every day | SUBCUTANEOUS | Status: DC
  Administered 2020-01-18: 15 [IU] via SUBCUTANEOUS
  Filled 2020-01-18 (×2): qty 0.15

## 2020-01-18 MED ORDER — FENTANYL CITRATE (PF) 100 MCG/2ML IJ SOLN
INTRAMUSCULAR | Status: DC | PRN
  Administered 2020-01-18: 50 ug via INTRAVENOUS

## 2020-01-18 MED ORDER — 0.9 % SODIUM CHLORIDE (POUR BTL) OPTIME
TOPICAL | Status: DC | PRN
  Administered 2020-01-18: 1000 mL

## 2020-01-18 MED ORDER — SODIUM CHLORIDE 0.9 % IR SOLN
Status: DC | PRN
  Administered 2020-01-18: 3000 mL

## 2020-01-18 MED ORDER — VANCOMYCIN HCL 750 MG/150ML IV SOLN
750.0000 mg | Freq: Two times a day (BID) | INTRAVENOUS | Status: DC
  Administered 2020-01-19 – 2020-01-22 (×8): 750 mg via INTRAVENOUS
  Filled 2020-01-18 (×11): qty 150

## 2020-01-18 MED ORDER — FENTANYL CITRATE (PF) 250 MCG/5ML IJ SOLN
INTRAMUSCULAR | Status: AC
Start: 2020-01-18 — End: ?
  Filled 2020-01-18: qty 5

## 2020-01-18 MED ORDER — SODIUM CHLORIDE 0.9 % IV SOLN: 2.0000 g | Freq: Once | INTRAVENOUS | Status: DC

## 2020-01-18 MED ORDER — OXYCODONE HCL 5 MG/5ML PO SOLN: 5.0000 mg | Freq: Once | ORAL | Status: DC | PRN

## 2020-01-18 MED ORDER — MORPHINE SULFATE (PF) 2 MG/ML IV SOLN: 2.0000 mg | INTRAVENOUS | Status: DC | PRN

## 2020-01-18 MED ORDER — FOLIC ACID 1 MG PO TABS
1.0000 mg | ORAL_TABLET | Freq: Every day | ORAL | Status: DC
  Administered 2020-01-19 – 2020-01-22 (×4): 1 mg via ORAL
  Filled 2020-01-18 (×4): qty 1

## 2020-01-18 MED ORDER — LACTATED RINGERS IV SOLN: INTRAVENOUS | Status: DC | PRN

## 2020-01-18 MED ORDER — VANCOMYCIN HCL IN DEXTROSE 1-5 GM/200ML-% IV SOLN: 1000.0000 mg | Freq: Once | INTRAVENOUS | Status: DC

## 2020-01-18 MED ORDER — PROMETHAZINE HCL 25 MG/ML IJ SOLN: 6.2500 mg | INTRAMUSCULAR | Status: DC | PRN

## 2020-01-18 MED ORDER — LISINOPRIL 20 MG PO TABS: 20.0000 mg | ORAL_TABLET | Freq: Every day | ORAL | Status: DC

## 2020-01-18 MED ORDER — SODIUM CHLORIDE 0.9 % IV SOLN
2.0000 g | Freq: Three times a day (TID) | INTRAVENOUS | Status: DC
  Administered 2020-01-18 – 2020-01-21 (×8): 2 g via INTRAVENOUS
  Filled 2020-01-18 (×9): qty 2

## 2020-01-18 MED ORDER — LORAZEPAM 1 MG PO TABS: 1.0000 mg | ORAL_TABLET | ORAL | Status: AC | PRN

## 2020-01-18 MED ORDER — MIDAZOLAM HCL 5 MG/5ML IJ SOLN
INTRAMUSCULAR | Status: DC | PRN
  Administered 2020-01-18 (×2): 1 mg via INTRAVENOUS

## 2020-01-18 MED ORDER — LORAZEPAM 2 MG/ML IJ SOLN: 1.0000 mg | INTRAMUSCULAR | Status: AC | PRN

## 2020-01-18 MED ORDER — BUPIVACAINE HCL (PF) 0.25 % IJ SOLN
INTRAMUSCULAR | Status: AC
  Filled 2020-01-18: qty 30

## 2020-01-18 MED ORDER — LIDOCAINE HCL (CARDIAC) PF 100 MG/5ML IV SOSY
PREFILLED_SYRINGE | INTRAVENOUS | Status: DC | PRN
  Administered 2020-01-18: 20 mg via INTRAVENOUS

## 2020-01-18 MED ORDER — MIDAZOLAM HCL 2 MG/2ML IJ SOLN
INTRAMUSCULAR | Status: AC
  Filled 2020-01-18: qty 2

## 2020-01-18 MED ORDER — PROPOFOL 10 MG/ML IV BOLUS
INTRAVENOUS | Status: DC | PRN
  Administered 2020-01-18: 200 mg via INTRAVENOUS

## 2020-01-18 SURGICAL SUPPLY — 46 items
BNDG CONFORM 2 STRL LF (GAUZE/BANDAGES/DRESSINGS) ×3 IMPLANT
BNDG ELASTIC 2X5.8 VLCR STR LF (GAUZE/BANDAGES/DRESSINGS) ×3 IMPLANT
BNDG ELASTIC 4X5.8 VLCR STR LF (GAUZE/BANDAGES/DRESSINGS) ×3 IMPLANT
BNDG ESMARK 4X9 LF (GAUZE/BANDAGES/DRESSINGS) IMPLANT
BNDG GAUZE ELAST 4 BULKY (GAUZE/BANDAGES/DRESSINGS) IMPLANT
CHLORAPREP W/TINT 26 (MISCELLANEOUS) IMPLANT
CORD BIPOLAR FORCEPS 12FT (ELECTRODE) ×3 IMPLANT
COVER SURGICAL LIGHT HANDLE (MISCELLANEOUS) ×3 IMPLANT
COVER WAND RF STERILE (DRAPES) ×3 IMPLANT
CUFF TOURN SGL QUICK 18X4 (TOURNIQUET CUFF) ×3 IMPLANT
CUFF TOURN SGL QUICK 24 (TOURNIQUET CUFF)
CUFF TRNQT CYL 24X4X16.5-23 (TOURNIQUET CUFF) IMPLANT
DECANTER SPIKE VIAL GLASS SM (MISCELLANEOUS) ×3 IMPLANT
DRAPE U-SHAPE 47X51 STRL (DRAPES) IMPLANT
DRSG XEROFORM 1X8 (GAUZE/BANDAGES/DRESSINGS) ×3 IMPLANT
GAUZE SPONGE 4X4 12PLY STRL (GAUZE/BANDAGES/DRESSINGS) ×3 IMPLANT
GAUZE SPONGE 4X4 12PLY STRL LF (GAUZE/BANDAGES/DRESSINGS) ×3 IMPLANT
GAUZE XEROFORM 5X9 LF (GAUZE/BANDAGES/DRESSINGS) IMPLANT
GLOVE BIOGEL PI IND STRL 8 (GLOVE) ×1 IMPLANT
GLOVE BIOGEL PI INDICATOR 8 (GLOVE) ×2
GLOVE SURG SYN 7.5  E (GLOVE) ×2
GLOVE SURG SYN 7.5 E (GLOVE) ×1 IMPLANT
GOWN STRL REUS W/ TWL LRG LVL3 (GOWN DISPOSABLE) ×1 IMPLANT
GOWN STRL REUS W/TWL LRG LVL3 (GOWN DISPOSABLE) ×2
KIT BASIN OR (CUSTOM PROCEDURE TRAY) ×3 IMPLANT
KIT TURNOVER KIT B (KITS) ×3 IMPLANT
MANIFOLD NEPTUNE II (INSTRUMENTS) ×3 IMPLANT
NEEDLE HYPO 25GX1X1/2 BEV (NEEDLE) ×3 IMPLANT
NS IRRIG 1000ML POUR BTL (IV SOLUTION) ×3 IMPLANT
PACK ORTHO EXTREMITY (CUSTOM PROCEDURE TRAY) ×3 IMPLANT
PAD ARMBOARD 7.5X6 YLW CONV (MISCELLANEOUS) ×3 IMPLANT
PAD CAST 4YDX4 CTTN HI CHSV (CAST SUPPLIES) ×1 IMPLANT
PADDING CAST COTTON 4X4 STRL (CAST SUPPLIES) ×3
SET CYSTO W/LG BORE CLAMP LF (SET/KITS/TRAYS/PACK) ×3 IMPLANT
SPONGE LAP 4X18 RFD (DISPOSABLE) ×3 IMPLANT
SUT PROLENE 4 0 PS 2 18 (SUTURE) ×3 IMPLANT
SWAB CULTURE ESWAB REG 1ML (MISCELLANEOUS) ×3 IMPLANT
SYR CONTROL 10ML LL (SYRINGE) ×3 IMPLANT
TOWEL GREEN STERILE (TOWEL DISPOSABLE) ×3 IMPLANT
TOWEL GREEN STERILE FF (TOWEL DISPOSABLE) ×3 IMPLANT
TUBE CONNECTING 12'X1/4 (SUCTIONS) ×1
TUBE CONNECTING 12X1/4 (SUCTIONS) ×2 IMPLANT
TUBING TUR DISP (UROLOGICAL SUPPLIES) IMPLANT
UNDERPAD 30X36 HEAVY ABSORB (UNDERPADS AND DIAPERS) ×6 IMPLANT
WATER STERILE IRR 1000ML POUR (IV SOLUTION) ×6 IMPLANT
YANKAUER SUCT BULB TIP NO VENT (SUCTIONS) IMPLANT

## 2020-01-18 NOTE — Consult Note (Signed)
ORTHOPAEDIC CONSULTATION  REQUESTING PHYSICIAN: Eber Hong, MD  PCP:  Patient, [name]  Chief Complaint: Right small finger pain  HPI: Lukas Pelcher is a 64 y.o. male who complains of right small finger pain. 2 days ago he was intoxicated when he fell onto the right hand. He was found to have a laceration to the finger at that time. He treated at home with some Band-Aids but over the past 24 hours he noted aggressive pain and swelling to the right small finger. He subsequently presented to the ER where he was found to have an open PIP dislocation with secondary infection to the small finger on the right side. Hand surgery was consulted. He does have a history of poorly controlled diabetes and hospitalist admitted the patient. I discussed with the patient treatment options and did recommend proceeding forward surgery and he presents today for that.  History reviewed. No pertinent past medical history. Past Surgical History:  Procedure Laterality Date  . amputation left leg     Social History   Socioeconomic History  . Marital status: Divorced    Spouse name: Not on file  . Number of children: Not on file  . Years of education: Not on file  . Highest education level: Not on file  Occupational History  . Not on file  Tobacco Use  . Smoking status: Never Smoker  . Smokeless tobacco: Never Used  Substance and Sexual Activity  . Alcohol use: Yes  . Drug use: Never  . Sexual activity: Not on file  Other Topics Concern  . Not on file  Social History Narrative  . Not on file   Social Determinants of Health   Financial Resource Strain:   . Difficulty of Paying Living Expenses: Not on file  Food Insecurity:   . Worried About Programme researcher, broadcasting/film/video in the Last Year: Not on file  . Ran Out of Food in the Last Year: Not on file  Transportation Needs:   . Lack of Transportation (Medical): Not on file  . Lack of Transportation (Non-Medical): Not on file  Physical Activity:    . Days of Exercise per Week: Not on file  . Minutes of Exercise per Session: Not on file  Stress:   . Feeling of Stress : Not on file  Social Connections:   . Frequency of Communication with Friends and Family: Not on file  . Frequency of Social Gatherings with Friends and Family: Not on file  . Attends Religious Services: Not on file  . Active Member of Clubs or Organizations: Not on file  . Attends Banker Meetings: Not on file  . Marital Status: Not on file   History reviewed. No pertinent family history. No Known Allergies Prior to Admission medications   Medication Sig Start Date End Date Taking? Authorizing Provider  folic acid (FOLVITE) 1 MG tablet Take 1 tablet (1 mg total) by mouth daily. 06/23/17   Hongalgi, Maximino Greenland, MD  glimepiride (AMARYL) 1 MG tablet Take 1 tablet (1 mg total) by mouth daily with breakfast. 06/23/17   Hongalgi, Maximino Greenland, MD  lisinopril (PRINIVIL,ZESTRIL) 20 MG tablet Take 1 tablet (20 mg total) by mouth daily. 06/22/17   Hongalgi, Maximino Greenland, MD  metFORMIN (GLUCOPHAGE) 1000 MG tablet Take 1 tablet (1,000 mg total) by mouth 2 (two) times daily with a meal. 06/22/17   Hongalgi, Maximino Greenland, MD  Multiple Vitamin (MULTIVITAMIN WITH MINERALS) TABS tablet Take 1 tablet by mouth daily. 06/23/17  Hongalgi, Maximino GreenlandAnand D, MD  thiamine 100 MG tablet Take 1 tablet (100 mg total) by mouth daily. 06/23/17   Elease EtienneHongalgi, Anand D, MD   CT Head Wo Contrast  Result Date: 01/18/2020 CLINICAL DATA:  64 year old male with facial trauma. EXAM: CT HEAD WITHOUT CONTRAST CT MAXILLOFACIAL WITHOUT CONTRAST CT CERVICAL SPINE WITHOUT CONTRAST TECHNIQUE: Multidetector CT imaging of the head, cervical spine, and maxillofacial structures were performed using the standard protocol without intravenous contrast. Multiplanar CT image reconstructions of the cervical spine and maxillofacial structures were also generated. COMPARISON:  Brain MRI dated 06/20/2017. FINDINGS: CT HEAD FINDINGS Brain: The ventricles  and sulci appropriate size for patient's age. 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 There is no acute intracranial hemorrhage. No mass effect midline shift no extra-axial fluid collection. Vascular: No hyperdense vessel or unexpected calcification. Skull: Normal. Negative for fracture or focal lesion. Other: None CT MAXILLOFACIAL FINDINGS Osseous: There is a fracture of the right nasal bone. No other acute fracture. No mandibular subluxation. Orbits: The globes and retro-orbital fat are preserved. Sinuses: Mild mucoperiosteal thickening of paranasal sinuses. No air-fluid level. The mastoid air cells are clear. Soft tissues: Mild soft tissue swelling over the nose. CT CERVICAL SPINE FINDINGS Alignment: No acute subluxation. Skull base and vertebrae: No acute fracture. Soft tissues and spinal canal: No prevertebral fluid or swelling. No visible canal hematoma. Disc levels: Degenerative changes with anterior osteophyte primarily at C3-C4 and C4-C5. Upper chest: Negative. Other: None IMPRESSION: 1. No acute intracranial pathology. 2. No acute/traumatic cervical spine pathology. 3. Right nasal bone fracture. Electronically Signed   By: Elgie CollardArash  Radparvar M.D.   On: 01/18/2020 19:50   CT Cervical Spine Wo Contrast  Result Date: 01/18/2020 CLINICAL DATA:  64 year old male with facial trauma. EXAM: CT HEAD WITHOUT CONTRAST CT MAXILLOFACIAL WITHOUT CONTRAST CT CERVICAL SPINE WITHOUT CONTRAST TECHNIQUE: Multidetector CT imaging of the head, cervical spine, and maxillofacial structures were performed using the standard protocol without intravenous contrast. Multiplanar CT image reconstructions of the cervical spine and maxillofacial structures were also generated. COMPARISON:  Brain MRI dated 06/20/2017. FINDINGS: CT HEAD FINDINGS Brain: The ventricles and sulci appropriate size for patient's age. 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 There is no  acute intracranial hemorrhage. No mass effect midline shift no extra-axial fluid collection. Vascular: No hyperdense vessel or unexpected calcification. Skull: Normal. Negative for fracture or focal lesion. Other: None CT MAXILLOFACIAL FINDINGS Osseous: There is a fracture of the right nasal bone. No other acute fracture. No mandibular subluxation. Orbits: The globes and retro-orbital fat are preserved. Sinuses: Mild mucoperiosteal thickening of paranasal sinuses. No air-fluid level. The mastoid air cells are clear. Soft tissues: Mild soft tissue swelling over the nose. CT CERVICAL SPINE FINDINGS Alignment: No acute subluxation. Skull base and vertebrae: No acute fracture. Soft tissues and spinal canal: No prevertebral fluid or swelling. No visible canal hematoma. Disc levels: Degenerative changes with anterior osteophyte primarily at C3-C4 and C4-C5. Upper chest: Negative. Other: None IMPRESSION: 1. No acute intracranial pathology. 2. No acute/traumatic cervical spine pathology. 3. Right nasal bone fracture. Electronically Signed   By: Elgie CollardArash  Radparvar M.D.   On: 01/18/2020 19:50   DG Finger Little Right  Result Date: 01/18/2020 CLINICAL DATA:  Fall with swelling EXAM: RIGHT LITTLE FINGER 2+V COMPARISON:  None. FINDINGS: Dorsal and radial dislocation of the fifth middle phalanx with respect to the proximal phalanx. Suspected small fracture fragments adjacent to the head of the proximal phalanx. Vascular calcifications.  IMPRESSION: Dorsal and radial dislocation of the fifth middle phalanx with respect to the proximal phalanx with suspected small fracture fragments adjacent to the head of the proximal phalanx Electronically Signed   By: Jasmine Pang M.D.   On: 01/18/2020 17:55   DG MINI C-ARM IMAGE ONLY  Result Date: 01/18/2020 There is no interpretation for this exam.  This order is for images obtained during a surgical procedure.  Please See "Surgeries" Tab for more information regarding the procedure.    CT Maxillofacial Wo Contrast  Result Date: 01/18/2020 CLINICAL DATA:  64 year old male with facial trauma. EXAM: CT HEAD WITHOUT CONTRAST CT MAXILLOFACIAL WITHOUT CONTRAST CT CERVICAL SPINE WITHOUT CONTRAST TECHNIQUE: Multidetector CT imaging of the head, cervical spine, and maxillofacial structures were performed using the standard protocol without intravenous contrast. Multiplanar CT image reconstructions of the cervical spine and maxillofacial structures were also generated. COMPARISON:  Brain MRI dated 06/20/2017. FINDINGS: CT HEAD FINDINGS Brain: The ventricles and sulci appropriate size for patient's age. 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 There is no acute intracranial hemorrhage. No mass effect midline shift no extra-axial fluid collection. Vascular: No hyperdense vessel or unexpected calcification. Skull: Normal. Negative for fracture or focal lesion. Other: None CT MAXILLOFACIAL FINDINGS Osseous: There is a fracture of the right nasal bone. No other acute fracture. No mandibular subluxation. Orbits: The globes and retro-orbital fat are preserved. Sinuses: Mild mucoperiosteal thickening of paranasal sinuses. No air-fluid level. The mastoid air cells are clear. Soft tissues: Mild soft tissue swelling over the nose. CT CERVICAL SPINE FINDINGS Alignment: No acute subluxation. Skull base and vertebrae: No acute fracture. Soft tissues and spinal canal: No prevertebral fluid or swelling. No visible canal hematoma. Disc levels: Degenerative changes with anterior osteophyte primarily at C3-C4 and C4-C5. Upper chest: Negative. Other: None IMPRESSION: 1. No acute intracranial pathology. 2. No acute/traumatic cervical spine pathology. 3. Right nasal bone fracture. Electronically Signed   By: Elgie Collard M.D.   On: 01/18/2020 19:50    Positive ROS: All other systems have been reviewed and were otherwise negative with the exception of those mentioned in the HPI and  as above.  Physical Exam: General: Alert, no acute distress Cardiovascular: No edema Respiratory: No cyanosis, no use of accessory musculature Skin: Transverse laceration over the palmar aspect of the small finger PIP joint. Psychiatric: Patient is competent for consent with normal mood and affect  MUSCULOSKELETAL: Transverse laceration over the palmar aspect of small finger PIP joint. Surrounding erythema throughout the small finger and extending over the A1 pulley of the small finger. No open wounds or signs of infection elsewhere in the hand. Pain with digit range of motion to the small finger including both flexion and extension. No significant active motion was appreciated secondary to his pain. The tip of the digit though is warm well perfused with brisk capillary refill. He does have intact sensation to both radial and ulnar aspects of the small finger.  Assessment: Open right small finger PIP dislocation with secondary infection  Plan: Plan to proceed forward with irrigation debridement of the small finger as well as reduction of the PIP joint.  Risk, benefits and alternatives of the procedure were discussed with the patient. These risks include but are not limited to infection, bleeding, damage to surrounding structures including blood vessels and nerves, pain, stiffness, need for additional procedures. Informed consent was obtained the patient's right hand was marked.  Plan for admission to the hospitalist service postoperatively for continued IV  antibiotics.    Ernest Mallick, MD [phone]   01/18/2020 9:08 PM

## 2020-01-18 NOTE — Anesthesia Preprocedure Evaluation (Addendum)
Anesthesia Evaluation  Patient identified by MRN, date of birth, ID band Patient awake    Reviewed: Allergy & Precautions, NPO status , Patient's Chart, lab work & pertinent test results  History of Anesthesia Complications Negative for: history of anesthetic complications  Airway Mallampati: I  TM Distance: >3 FB Neck ROM: Full    Dental  (+) Poor Dentition, Missing, Dental Advisory Given   Pulmonary neg pulmonary ROS,  01/18/2020 SARS coronavirus NEG   breath sounds clear to auscultation       Cardiovascular hypertension, Pt. on medications (-) angina Rhythm:Regular Rate:Normal     Neuro/Psych negative neurological ROS  negative psych ROS   GI/Hepatic negative GI ROS, Neg liver ROS,   Endo/Other  diabetes (glu 377), Oral Hypoglycemic Agents  Renal/GU negative Renal ROS     Musculoskeletal   Abdominal   Peds  Hematology negative hematology ROS (+)   Anesthesia Other Findings   Reproductive/Obstetrics                            Anesthesia Physical Anesthesia Plan  ASA: III  Anesthesia Plan: General   Post-op Pain Management:    Induction: Intravenous  PONV Risk Score and Plan: 2 and Ondansetron and Dexamethasone  Airway Management Planned: LMA  Additional Equipment: None  Intra-op Plan:   Post-operative Plan:   Informed Consent: I have reviewed the patients History and Physical, chart, labs and discussed the procedure including the risks, benefits and alternatives for the proposed anesthesia with the patient or authorized representative who has indicated his/her understanding and acceptance.     Dental advisory given  Plan Discussed with: CRNA and Surgeon  Anesthesia Plan Comments:        Anesthesia Quick Evaluation

## 2020-01-18 NOTE — Anesthesia Procedure Notes (Signed)
Procedure Name: LMA Insertion Date/Time: 01/18/2020 9:18 PM Performed by: Claudina Lick, CRNA Pre-anesthesia Checklist: Patient identified, Emergency Drugs available, Suction available, Patient being monitored and Timeout performed Patient Re-evaluated:Patient Re-evaluated prior to induction Oxygen Delivery Method: Circle system utilized Preoxygenation: Pre-oxygenation with 100% oxygen Induction Type: IV induction Ventilation: Mask ventilation without difficulty LMA: LMA flexible inserted LMA Size: 4.0 Placement Confirmation: positive ETCO2 and breath sounds checked- equal and bilateral Tube secured with: Tape Dental Injury: Teeth and Oropharynx as per pre-operative assessment

## 2020-01-18 NOTE — ED Provider Notes (Signed)
[facility] EMERGENCY DEPARTMENT Provider Note   CSN: [id number] Arrival date & time: 01/18/20  1547     History Chief Complaint  Patient presents with   Ryan Mcbride    Daviel Allegretto is a 64 y.o. male with history of seizures and diabetes type 2.  Patient presents with a chief complaint of pain and swelling to his right fifth finger.  Patient reports that he fell 2 days prior.  Patient is unsure of the exact cause of his fall as he was intoxicated drinking 7-9 beers.  Patient does remember that he fell onto concrete and was able to get himself off the floor without difficulty.  Patient reports the pain and swelling to his right fifth finger has been constant and unchanged since the fall 2 days prior.  Patient rates his pain as a 10 out of 10 on the pain scale.  Patient denies trying any medication to help alleviate his pain.  Patient denied any episodes of nausea, vomiting, headaches, lightheadedness, or dizziness.  Patient was unsure if he had any loss of consciousness.  Patient denies any head, neck, back, or pelvic pain.  He denies any fever or chills.  Patient is unsure when his last tetanus shot was.      History reviewed. No pertinent past medical history.  Patient Active Problem List   Diagnosis Date Noted   Cellulitis of finger of right hand 01/18/2020   Closed fracture dislocation of proximal interphalangeal (PIP) joint of finger 01/18/2020   Uncontrolled type 2 diabetes mellitus with hyperglycemia, without long-term current use of insulin (HCC) 01/18/2020   Essential hypertension 01/18/2020   Alcohol abuse 01/18/2020   Seizure [facility])     Past Surgical History:  Procedure Laterality Date   amputation left leg         History reviewed. No pertinent family history.  Social History   Tobacco Use   Smoking status: Never Smoker   Smokeless tobacco: Never Used  Substance Use Topics   Alcohol use: Yes   Drug use: Never    Home  Medications Prior to Admission medications   Medication Sig Start Date End Date Taking? Authorizing Provider  folic acid (FOLVITE) 1 MG tablet Take 1 tablet (1 mg total) by mouth daily. 06/23/17   Hongalgi, Maximino Greenland, MD  glimepiride (AMARYL) 1 MG tablet Take 1 tablet (1 mg total) by mouth daily with breakfast. 06/23/17   Hongalgi, Maximino Greenland, MD  lisinopril (PRINIVIL,ZESTRIL) 20 MG tablet Take 1 tablet (20 mg total) by mouth daily. 06/22/17   Hongalgi, Maximino Greenland, MD  metFORMIN (GLUCOPHAGE) 1000 MG tablet Take 1 tablet (1,000 mg total) by mouth 2 (two) times daily with a meal. 06/22/17   Hongalgi, Maximino Greenland, MD  Multiple Vitamin (MULTIVITAMIN WITH MINERALS) TABS tablet Take 1 tablet by mouth daily. 06/23/17   Hongalgi, Maximino Greenland, MD  thiamine 100 MG tablet Take 1 tablet (100 mg total) by mouth daily. 06/23/17   Hongalgi, Maximino Greenland, MD    Allergies    Patient has no known allergies.  Review of Systems   Review of Systems  Constitutional: Negative for chills and fever.  HENT: Positive for facial swelling.   Eyes: Negative for visual disturbance.  Respiratory: Negative for shortness of breath.   Cardiovascular: Negative.   Gastrointestinal: Negative for abdominal pain, nausea and vomiting.  Genitourinary: Negative for difficulty urinating.  Musculoskeletal: Negative for back pain and neck pain.  Skin: Positive for color change and wound.  Neurological: Negative for  dizziness, seizures, weakness, light-headedness, numbness and headaches.  Psychiatric/Behavioral: Negative for confusion.    Physical Exam Updated Vital Signs BP (!) 150/97 (BP Location: Left Arm)    Pulse 82    Temp 97.7 F (36.5 C) (Oral)    Resp 16    Ht 5\' 5"  (1.651 m)    Wt 68 kg    SpO2 99%    BMI 24.96 kg/m   Physical Exam Constitutional:      General: He is not in acute distress.    Appearance: He is not ill-appearing or toxic-appearing.  HENT:     Head: Normocephalic. No Battle's sign, right periorbital erythema or left periorbital  erythema.     Jaw: No trismus, pain on movement or malocclusion.     Comments: Abrasion and bruising under right eye Eyes:     General: Vision grossly intact.  Cardiovascular:     Rate and Rhythm: Normal rate and regular rhythm.     Heart sounds: Normal heart sounds.  Pulmonary:     Effort: Pulmonary effort is normal.     Breath sounds: Normal breath sounds.  Abdominal:     Palpations: Abdomen is soft.     Tenderness: There is no abdominal tenderness.  Musculoskeletal:     Cervical back: Neck supple. No deformity or tenderness. No pain with movement, spinous process tenderness or muscular tenderness.     Thoracic back: No deformity or tenderness.     Lumbar back: No deformity or tenderness.     Comments: No pain with passive range of motion bilateral knee and hip.    right fifth finger: swelling, deformity  approximately 1cm laceration, tenderness        Left Lower Extremity: Left leg is amputated below knee.  Skin:    General: Skin is warm and dry.     Findings: Erythema (right 5th finger, spreading onto palm and adjacent finger ) present.  Neurological:     General: No focal deficit present.     Mental Status: He is alert.           ED Results / Procedures / Treatments   Labs (all labs ordered are listed, but only abnormal results are displayed) Labs Reviewed  COMPREHENSIVE METABOLIC PANEL - Abnormal; Notable for the following components:      Result Value   Sodium 132 (*)    Chloride 97 (*)    Glucose, Bld 377 (*)    All other components within normal limits  GLUCOSE, CAPILLARY - Abnormal; Notable for the following components:   Glucose-Capillary 246 (*)    All other components within normal limits  GLUCOSE, CAPILLARY - Abnormal; Notable for the following components:   Glucose-Capillary 216 (*)    All other components within normal limits  RESP PANEL BY RT-PCR (FLU A&B, COVID) [id number]  AEROBIC/ANAEROBIC CULTURE (SURGICAL/DEEP WOUND)  CBC WITH  DIFFERENTIAL/PLATELET  HEMOGLOBIN A1C  HIV ANTIBODY (ROUTINE TESTING W REFLEX)  COMPREHENSIVE METABOLIC PANEL  MAGNESIUM  PHOSPHORUS  CBC WITH DIFFERENTIAL/PLATELET    EKG None  Radiology CT Head Wo Contrast  Result Date: 01/18/2020 CLINICAL DATA:  64 year old male with facial trauma. EXAM: CT HEAD WITHOUT CONTRAST CT MAXILLOFACIAL WITHOUT CONTRAST CT CERVICAL SPINE WITHOUT CONTRAST TECHNIQUE: Multidetector CT imaging of the head, cervical spine, and maxillofacial structures were performed using the standard protocol without intravenous contrast. Multiplanar CT image reconstructions of the cervical spine and maxillofacial structures were also generated. COMPARISON:  Brain MRI dated 06/20/2017. FINDINGS: CT HEAD FINDINGS Brain: The ventricles  and sulci appropriate size for patient's age. 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 There is no acute intracranial hemorrhage. No mass effect midline shift no extra-axial fluid collection. Vascular: No hyperdense vessel or unexpected calcification. Skull: Normal. Negative for fracture or focal lesion. Other: None CT MAXILLOFACIAL FINDINGS Osseous: There is a fracture of the right nasal bone. No other acute fracture. No mandibular subluxation. Orbits: The globes and retro-orbital fat are preserved. Sinuses: Mild mucoperiosteal thickening of paranasal sinuses. No air-fluid level. The mastoid air cells are clear. Soft tissues: Mild soft tissue swelling over the nose. CT CERVICAL SPINE FINDINGS Alignment: No acute subluxation. Skull base and vertebrae: No acute fracture. Soft tissues and spinal canal: No prevertebral fluid or swelling. No visible canal hematoma. Disc levels: Degenerative changes with anterior osteophyte primarily at C3-C4 and C4-C5. Upper chest: Negative. Other: None IMPRESSION: 1. No acute intracranial pathology. 2. No acute/traumatic cervical spine pathology. 3. Right nasal bone fracture. Electronically Signed    By: Elgie Collard M.D.   On: 01/18/2020 19:50   CT Cervical Spine Wo Contrast  Result Date: 01/18/2020 CLINICAL DATA:  64 year old male with facial trauma. EXAM: CT HEAD WITHOUT CONTRAST CT MAXILLOFACIAL WITHOUT CONTRAST CT CERVICAL SPINE WITHOUT CONTRAST TECHNIQUE: Multidetector CT imaging of the head, cervical spine, and maxillofacial structures were performed using the standard protocol without intravenous contrast. Multiplanar CT image reconstructions of the cervical spine and maxillofacial structures were also generated. COMPARISON:  Brain MRI dated 06/20/2017. FINDINGS: CT HEAD FINDINGS Brain: The ventricles and sulci appropriate size for patient's age. 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 There is no acute intracranial hemorrhage. No mass effect midline shift no extra-axial fluid collection. Vascular: No hyperdense vessel or unexpected calcification. Skull: Normal. Negative for fracture or focal lesion. Other: None CT MAXILLOFACIAL FINDINGS Osseous: There is a fracture of the right nasal bone. No other acute fracture. No mandibular subluxation. Orbits: The globes and retro-orbital fat are preserved. Sinuses: Mild mucoperiosteal thickening of paranasal sinuses. No air-fluid level. The mastoid air cells are clear. Soft tissues: Mild soft tissue swelling over the nose. CT CERVICAL SPINE FINDINGS Alignment: No acute subluxation. Skull base and vertebrae: No acute fracture. Soft tissues and spinal canal: No prevertebral fluid or swelling. No visible canal hematoma. Disc levels: Degenerative changes with anterior osteophyte primarily at C3-C4 and C4-C5. Upper chest: Negative. Other: None IMPRESSION: 1. No acute intracranial pathology. 2. No acute/traumatic cervical spine pathology. 3. Right nasal bone fracture. Electronically Signed   By: Elgie Collard M.D.   On: 01/18/2020 19:50   DG Finger Little Right  Result Date: 01/18/2020 CLINICAL DATA:  Fall with  swelling EXAM: RIGHT LITTLE FINGER 2+V COMPARISON:  None. FINDINGS: Dorsal and radial dislocation of the fifth middle phalanx with respect to the proximal phalanx. Suspected small fracture fragments adjacent to the head of the proximal phalanx. Vascular calcifications. IMPRESSION: Dorsal and radial dislocation of the fifth middle phalanx with respect to the proximal phalanx with suspected small fracture fragments adjacent to the head of the proximal phalanx Electronically Signed   By: Jasmine Pang M.D.   On: 01/18/2020 17:55   DG MINI C-ARM IMAGE ONLY  Result Date: 01/18/2020 There is no interpretation for this exam.  This order is for images obtained during a surgical procedure.  Please See "Surgeries" Tab for more information regarding the procedure.   CT Maxillofacial Wo Contrast  Result Date: 01/18/2020 CLINICAL DATA:  64 year old male with facial trauma. EXAM: CT HEAD WITHOUT  CONTRAST CT MAXILLOFACIAL WITHOUT CONTRAST CT CERVICAL SPINE WITHOUT CONTRAST TECHNIQUE: Multidetector CT imaging of the head, cervical spine, and maxillofacial structures were performed using the standard protocol without intravenous contrast. Multiplanar CT image reconstructions of the cervical spine and maxillofacial structures were also generated. COMPARISON:  Brain MRI dated 06/20/2017. FINDINGS: CT HEAD FINDINGS Brain: The ventricles and sulci appropriate size for patient's age. 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 There is no acute intracranial hemorrhage. No mass effect midline shift no extra-axial fluid collection. Vascular: No hyperdense vessel or unexpected calcification. Skull: Normal. Negative for fracture or focal lesion. Other: None CT MAXILLOFACIAL FINDINGS Osseous: There is a fracture of the right nasal bone. No other acute fracture. No mandibular subluxation. Orbits: The globes and retro-orbital fat are preserved. Sinuses: Mild mucoperiosteal thickening of paranasal  sinuses. No air-fluid level. The mastoid air cells are clear. Soft tissues: Mild soft tissue swelling over the nose. CT CERVICAL SPINE FINDINGS Alignment: No acute subluxation. Skull base and vertebrae: No acute fracture. Soft tissues and spinal canal: No prevertebral fluid or swelling. No visible canal hematoma. Disc levels: Degenerative changes with anterior osteophyte primarily at C3-C4 and C4-C5. Upper chest: Negative. Other: None IMPRESSION: 1. No acute intracranial pathology. 2. No acute/traumatic cervical spine pathology. 3. Right nasal bone fracture. Electronically Signed   By: Elgie Collard M.D.   On: 01/18/2020 19:50    Procedures Procedures (including critical care time)  Medications Ordered in ED Medications  0.9 %  sodium chloride infusion ( Intravenous New Bag/Given 01/18/20 1928)  insulin aspart (novoLOG) injection 0-15 Units (0 Units Subcutaneous Not Given 01/18/20 2252)  insulin glargine (LANTUS) injection 15 Units (15 Units Subcutaneous Given 01/18/20 2250)  insulin aspart (novoLOG) injection 4 Units (has no administration in time range)  lactated ringers infusion (has no administration in time range)  oxyCODONE-acetaminophen (PERCOCET/ROXICET) 5-325 MG per tablet 1 tablet (has no administration in time range)    Or  morphine 2 MG/ML injection 2 mg (has no administration in time range)  lisinopril (ZESTRIL) tablet 20 mg (has no administration in time range)  LORazepam (ATIVAN) tablet 1-4 mg (has no administration in time range)    Or  LORazepam (ATIVAN) injection 1-4 mg (has no administration in time range)  thiamine tablet 100 mg (has no administration in time range)    Or  thiamine (B-1) injection 100 mg (has no administration in time range)  folic acid (FOLVITE) tablet 1 mg (has no administration in time range)  multivitamin with minerals tablet 1 tablet (has no administration in time range)  acetaminophen (TYLENOL) tablet 650 mg (has no administration in time range)     Or  acetaminophen (TYLENOL) suppository 650 mg (has no administration in time range)  polyethylene glycol (MIRALAX / GLYCOLAX) packet 17 g (has no administration in time range)  ondansetron (ZOFRAN) tablet 4 mg (has no administration in time range)    Or  ondansetron (ZOFRAN) injection 4 mg (has no administration in time range)  vancomycin (VANCOREADY) IVPB 750 mg/150 mL (has no administration in time range)  ceFEPIme (MAXIPIME) 2 g in sodium chloride 0.9 % 100 mL IVPB (has no administration in time range)  cefTRIAXone (ROCEPHIN) 1 g in sodium chloride 0.9 % 100 mL IVPB (0 g Intravenous Stopped 01/18/20 1957)  insulin aspart (novoLOG) 100 UNIT/ML injection (  Override pull for Anesthesia 01/18/20 2126)    ED Course  I have reviewed the triage vital signs and the nursing notes.  Pertinent labs &  imaging results that were available during my care of the patient were reviewed by me and considered in my medical decision making (see chart for details).    MDM Rules/Calculators/A&P                          Alert well-appearing 64 year old male with a history of diabetes and seizure presents to the emergency department with a chief complaint of right fifth finger pain.  Patient sustained the injury during a fall tonight prior.  Patient does not remember the exact details of the fall as he was intoxicated at the time.  On examination today there is obvious deformity and swelling to the right fifth finger, with laceration that appears to have purulent drainage.  There is obvious erythema to the right fifth finger that spreads into palm and adjacent finger.  Also has an abrasion and bruising under his right eye.    X-ray showed dorsal and radial dislocation of the fifth middle phalanx with dorsal aspect of the proximal phalanx with suspected small fracture fragments adjacent to the head of the proximal phalanx.    Due to unclear history of patient's fall, and bruising under right eye CT scan of  cervical spine, head, maxillofacial was ordered.   Imaging showed no acute intracranial pathology, no acute/traumatic cervical spine pathology, and right nasal bone fracture.    CMP showed glucose elevated at 377, CBC was unremarkable.   On-call hand specialist Dr. Roney Mansreighton was contacted by Dr. Hyacinth MeekerMiller and informed of the patient and his condition.  Due to likely infection patient was given ceftriaxone.  Internal medicine was contacted to admit the patient due to his infected wound and uncontrolled diabetes.   Final Clinical Impression(s) / ED Diagnoses Final diagnoses:  Displaced fracture of proximal phalanx of right little finger, initial encounter for closed fracture  Closed fracture of nasal bone, initial encounter  Hyperglycemia  Finger infection    Rx / DC Orders ED Discharge Orders    None       Berneice HeinrichBadalamente, Amandamarie Feggins R, PA-C 01/18/20 2323    Eber HongMiller, Brian, MD 01/19/20 1510

## 2020-01-18 NOTE — ED Triage Notes (Signed)
Pt st's he fell 2 days ago.  C/o pain and swelling to right 5th finger.  Small lac noted with redness.  Pt also hit his nose but denies any pain

## 2020-01-18 NOTE — Transfer of Care (Signed)
Immediate Anesthesia Transfer of Care Note  Patient: Mrk Buzby  Procedure(s) Performed: IRRIGATION AND DEBRIDEMENT SMALL FINGER  AND OPEN FRACTURE,,OPEN REDUCTION OF RIGHT PIP JOINT (Right Finger)  Patient Location: PACU  Anesthesia Type:General  Level of Consciousness: drowsy  Airway & Oxygen Therapy: Patient Spontanous Breathing  Post-op Assessment: Report given to RN and Post -op Vital signs reviewed and stable  Post vital signs: Reviewed and stable  Last Vitals:  Vitals Value Taken Time  BP 134/88 01/18/20 2227  Temp 36.9 C 01/18/20 2227  Pulse 80 01/18/20 2228  Resp 20 01/18/20 2228  SpO2 97 % 01/18/20 2228  Vitals shown include unvalidated device data.  Last Pain:  Vitals:   01/18/20 2033  TempSrc: Oral  PainSc:          Complications: No complications documented.

## 2020-01-18 NOTE — ED Notes (Signed)
Patient transported to CT 

## 2020-01-18 NOTE — Progress Notes (Signed)
Pharmacy Antibiotic Note  Ryan Mcbride is a 64 y.o. male who fell 2 days ago admitted on 01/18/2020 with right 5th finger pain and right nasal bone fracture.  Currently int he OR for I&D and reduction of the PIP joint.  Pharmacy has been consulted for vancomycin and cefepime dosing for cellulitis.  Noted he received Rocephin around 1930.  SCr 1.01, CrCL 64 ml/min, afebrile, WBC WNL.  Plan: Vanc 750mg  IV Q12H for goal trough 10-15 mcg/mL Cefepime 2gm IV Q8H Monitor renal fxn, micro data to de-escalate vs check vanc trough  Height: 5\' 5"  (165.1 cm) Weight: 68 kg (150 lb) IBW/kg (Calculated) : 61.5  Temp (24hrs), Avg:98.2 F (36.8 C), Min:98.1 F (36.7 C), Max:98.2 F (36.8 C)  Recent Labs  Lab 01/18/20 1835  WBC 7.7  CREATININE 1.01    Estimated Creatinine Clearance: 64.3 mL/min (by C-G formula based on SCr of 1.01 mg/dL).    No Known Allergies  CTX x1 11/26 Vanc 11/26 >> Cefepime 11/26 >>  11/26 OR wound cx -   [name] D. 12/26, PharmD, BCPS, BCCCP 01/18/2020, 10:27 PM

## 2020-01-18 NOTE — ED Provider Notes (Signed)
       Medical screening examination/treatment/procedure(s) were conducted as a shared visit with non-physician practitioner(s) and myself.  I personally evaluated the patient during the encounter.  Clinical Impression:   Final diagnoses:  Displaced fracture of proximal phalanx of right little finger, initial encounter for closed fracture  Closed fracture of nasal bone, initial encounter  Hyperglycemia  Finger infection   This patient is a 64 year old male, he endorses being diagnosed with diabetes but has not been on meds for several months, he states that while he was alcohol intoxicated 48 hours ago he had a fall, he does not know how it happened, does not know what he hit but ended up with an injury to his face over his nose, his right eye and his right hand where he injured his right small finger.  He suffered a laceration on the volar surface of the PIP joint, there is purulent drainage coming out with associated redness and swelling and redness extending onto the hand.  Please see the pictures above.  He will need hand surgery consultation due to potentially having an infected joint, will need to have likely cleanout surgically, will need antibiotics which I have ordered and will need CT scans to evaluate for head injury including basilar skull fracture.  Labs ordered, the patient is hemodynamically stable.  I discussed the care with Dr. Roney Mans with the hand surgery service who agrees that the patient will need to have surgery and request that the hospitalist admit    Eber Hong, MD 01/19/20 1510

## 2020-01-18 NOTE — Op Note (Signed)
PREOPERATIVE DIAGNOSIS: Open right small PIP dislocation with secondary infection  POSTOPERATIVE DIAGNOSIS: Open right small finger PIP dislocation with volar lip fracture of the middle phalanx and secondary infection  ATTENDING PHYSICIAN: Gasper Lloyd. Roney Mans, III, MD who was present and scrubbed for the entire case   ASSISTANT SURGEON: None.   ANESTHESIA: General  SURGICAL PROCEDURES: 1.  Irrigation and debridement of open right small finger PIP joint fracture dislocation 2.  Irrigation and debridement of right small finger flexor tendon sheath 3.  Open reduction of right small finger PIP joint 4.  Open treatment of right small finger middle phalanx volar lip fracture without internal fixation  SURGICAL INDICATIONS: Patient is a 64 year old male who proximally 2 days ago was intoxicated when he fell onto the right hand.  He sustained a laceration of the finger and treated it with Band-Aids at home.  He subsequently went on to develop increasing pain and swelling to the digit and was seen in the ER.  He was found to have an open fracture dislocation of the right small finger PIP joint.  This had become infected.  I did recommend proceeding forward with irrigation debridement and surgical fixation as indicated and he presents for that.  FINDING: Dislocation of the right small finger PIP joint with a transverse open wound over the volar PIP joint.  Displaced fracture of the middle phalanx volar lip as well.  Purulence throughout the wound including the flexor tendon sheath.  Successful irrigation and debridement of the wound and reduction of the PIP joint was achieved.  PIP joint with stable through full arc of motion following reduction.  DESCRIPTION OF PROCEDURE: Patient was identified in the preop holding area where the risk benefits and alternatives of the procedure were discussed with the patient.  These risks include but are not limited to infection, bleeding, damage to surrounding structures  including blood vessels and nerves, pain, stiffness, malunion, nonunion, degenerative arthritis and need for additional procedures.  Informed consent was obtained at that time the patient's right small finger was marked with a surgical marking pen.  He was then brought back to to the operative suite where timeout was performed identifying the correct patient operative site.  He was positioned supine on the operative table with his hand outstretched on a hand table.  A tourniquet was placed on the upper arm and the patient was induced under general anesthesia.  The right upper extremity was then prepped and draped in usual sterile fashion.  The limb was exsanguinated and the tourniquet was inflated.  There was a transverse laceration over the volar PIP joint.  This was extended both proximally and distally along the mid lateral plane.  Skin flaps were elevated.  There is purulence immediately within the wound which was cultured for both aerobic and anaerobic bacteria.  The radial and ulnar neurovascular bundles were visualized and protected.  The flexor tendon sheath was found as were the FDS and FDP tendons at the volar PIP joint.  The joint was left dislocated and the wound was irrigated with approximately a liter and a half normal saline including abundant irrigation into the PIP joint.  The joint was reduced.  There was a small volar lip fracture on the middle phalanx.  The skin, subcutaneous tissue and bone within the area were all debrided with scissors.  There was some purulence within the flexor tendon sheath as well which was also irrigated using cystoscopy tubing.  Fluoroscopic images including both AP and lateral images of the  small finger were obtained which showed concentric reduction of the PIP joint in near-anatomic alignment of the volar lip fracture.  As there is active infection of the area, I elected to treat the volar lip fracture without hardware especially in light of the stable PIP joint.   The wound was again irrigated with another liter and a half of normal saline via cystoscopy tubing.  At this point 2 vessel loops were placed into the wound to act as small drains and the incisions were closed loosely with multiple 4-0 Prolene sutures.  Quarter percent bupivacaine was injected along the base of the small finger for postoperative pain control.  Xeroform, 4 x 4's and well-padded dorsal blocking splint to the small finger was then placed.  The tourniquet was released and the patient had return of brisk capillary refill to all of his digits.  He was awoken from his anesthesia and extubated in the operating room without any complications.  He tolerated the procedure well and there were no complications.  RADIOGRAPHIC INTERPRETATION: AP and lateral radiographs of the right small finger were obtained intraoperative fluoroscopy.  These show concentric reduction of the PIP joint with a nearly anatomic reduced volar lip fracture off the middle phalanx.  ESTIMATED BLOOD LOSS: 10 mL  TOURNIQUET TIME: 30 minutes  SPECIMENS: Aerobic and anaerobic cultures x1  POSTOPERATIVE PLAN: Patient will be admitted to the hospitalist service to continue with IV antibiotics.  We will follow-up his cultures and determine the appropriate p.o. antibiotic for discharge.  Drains will be removed in approximately 1 to 2 days.  IMPLANTS: None

## 2020-01-18 NOTE — Anesthesia Postprocedure Evaluation (Signed)
Anesthesia Post Note  Patient: Ryan Mcbride  Procedure(s) Performed: IRRIGATION AND DEBRIDEMENT SMALL FINGER  AND OPEN FRACTURE,,OPEN REDUCTION OF RIGHT PIP JOINT (Right Finger)     Patient location during evaluation: PACU Anesthesia Type: General Level of consciousness: awake and alert, patient cooperative and oriented Pain management: pain level controlled Vital Signs Assessment: post-procedure vital signs reviewed and stable Respiratory status: spontaneous breathing, nonlabored ventilation and respiratory function stable Cardiovascular status: blood pressure returned to baseline and stable Postop Assessment: no apparent nausea or vomiting and adequate PO intake Anesthetic complications: no   No complications documented.  Last Vitals:  Vitals:   01/18/20 2227 01/18/20 2230  BP: 134/88 137/83  Pulse: 81 81  Resp: 19 16  Temp: 36.9 C   SpO2: 97% 98%    Last Pain:  Vitals:   01/18/20 2230  TempSrc:   PainSc: 0-No pain                 Retta Pitcher,E. Jeriel Vivanco

## 2020-01-19 ENCOUNTER — Other Ambulatory Visit: Payer: Self-pay

## 2020-01-19 ENCOUNTER — Encounter (HOSPITAL_COMMUNITY): Payer: Self-pay | Admitting: Orthopaedic Surgery

## 2020-01-19 DIAGNOSIS — L03011 Cellulitis of right finger: Principal | ICD-10-CM

## 2020-01-19 DIAGNOSIS — E1165 Type 2 diabetes mellitus with hyperglycemia: Secondary | ICD-10-CM

## 2020-01-19 DIAGNOSIS — I10 Essential (primary) hypertension: Secondary | ICD-10-CM

## 2020-01-19 DIAGNOSIS — S62609A Fracture of unspecified phalanx of unspecified finger, initial encounter for closed fracture: Secondary | ICD-10-CM

## 2020-01-19 DIAGNOSIS — F1010 Alcohol abuse, uncomplicated: Secondary | ICD-10-CM

## 2020-01-19 LAB — GLUCOSE, CAPILLARY
Glucose-Capillary: 275 mg/dL — ABNORMAL HIGH (ref 70–99)
Glucose-Capillary: 177 mg/dL — ABNORMAL HIGH (ref 70–99)
Glucose-Capillary: 52 mg/dL — ABNORMAL LOW (ref 70–99)
Glucose-Capillary: 145 mg/dL — ABNORMAL HIGH (ref 70–99)
Glucose-Capillary: 210 mg/dL — ABNORMAL HIGH (ref 70–99)
Glucose-Capillary: 119 mg/dL — ABNORMAL HIGH (ref 70–99)

## 2020-01-19 LAB — COMPREHENSIVE METABOLIC PANEL
ALT: 18 U/L (ref 0–44)
AST: 18 U/L (ref 15–41)
Albumin: 3.1 g/dL — ABNORMAL LOW (ref 3.5–5.0)
Alkaline Phosphatase: 84 U/L (ref 38–126)
Anion gap: 13 (ref 5–15)
BUN: 12 mg/dL (ref 8–23)
CO2: 20 mmol/L — ABNORMAL LOW (ref 22–32)
Calcium: 8.3 mg/dL — ABNORMAL LOW (ref 8.9–10.3)
Chloride: 99 mmol/L (ref 98–111)
Creatinine, Ser: 0.89 mg/dL (ref 0.61–1.24)
GFR, Estimated: >60 mL/min (ref >60)
Glucose, Bld: 294 mg/dL — ABNORMAL HIGH (ref 70–99)
Potassium: 3.9 mmol/L (ref 3.5–5.1)
Sodium: 132 mmol/L — ABNORMAL LOW (ref 135–145)
Total Bilirubin: 0.9 mg/dL (ref 0.3–1.2)
Total Protein: 6.1 g/dL — ABNORMAL LOW (ref 6.5–8.1)

## 2020-01-19 LAB — CBC WITH DIFFERENTIAL/PLATELET
Abs Immature Granulocytes: 0.03 10*3/uL (ref 0.00–0.07)
Basophils Absolute: 0 10*3/uL (ref 0.0–0.1)
Basophils Relative: 0 %
Eosinophils Absolute: 0.1 10*3/uL (ref 0.0–0.5)
Eosinophils Relative: 1 %
HCT: 36.9 % — ABNORMAL LOW (ref 39.0–52.0)
Hemoglobin: 13 g/dL (ref 13.0–17.0)
Immature Granulocytes: 0 %
Lymphocytes Relative: 16 %
Lymphs Abs: 1.2 10*3/uL (ref 0.7–4.0)
MCH: 30.8 pg (ref 26.0–34.0)
MCHC: 35.2 g/dL (ref 30.0–36.0)
MCV: 87.4 fL (ref 80.0–100.0)
Monocytes Absolute: 0.8 10*3/uL (ref 0.1–1.0)
Monocytes Relative: 10 %
Neutro Abs: 5.4 10*3/uL (ref 1.7–7.7)
Neutrophils Relative %: 73 %
Platelets: 196 10*3/uL (ref 150–400)
RBC: 4.22 MIL/uL (ref 4.22–5.81)
RDW: 11.9 % (ref 11.5–15.5)
WBC: 7.6 10*3/uL (ref 4.0–10.5)
nRBC: 0 % (ref 0.0–0.2)

## 2020-01-19 LAB — HEMOGLOBIN A1C
Hgb A1c MFr Bld: 11.8 % — ABNORMAL HIGH (ref 4.8–5.6)
Mean Plasma Glucose: 291.96 mg/dL

## 2020-01-19 LAB — PHOSPHORUS: Phosphorus: 2.6 mg/dL (ref 2.5–4.6)

## 2020-01-19 LAB — MAGNESIUM: Magnesium: 1.6 mg/dL — ABNORMAL LOW (ref 1.7–2.4)

## 2020-01-19 LAB — HIV ANTIBODY (ROUTINE TESTING W REFLEX): HIV Screen 4th Generation wRfx: NONREACTIVE

## 2020-01-19 MED ORDER — INSULIN ASPART PROT & ASPART (70-30 MIX) 100 UNIT/ML ~~LOC~~ SUSP
15.0000 [IU] | Freq: Two times a day (BID) | SUBCUTANEOUS | Status: DC
  Administered 2020-01-19: 15 [IU] via SUBCUTANEOUS
  Filled 2020-01-19: qty 10

## 2020-01-19 MED ORDER — LISINOPRIL 20 MG PO TABS
20.0000 mg | ORAL_TABLET | Freq: Every day | ORAL | Status: DC
  Administered 2020-01-19 – 2020-01-22 (×4): 20 mg via ORAL
  Filled 2020-01-19 (×4): qty 1

## 2020-01-19 MED ORDER — MAGNESIUM SULFATE 2 GM/50ML IV SOLN
2.0000 g | Freq: Once | INTRAVENOUS | Status: AC
  Administered 2020-01-19: 2 g via INTRAVENOUS
  Filled 2020-01-19: qty 50

## 2020-01-19 MED ORDER — LACTATED RINGERS IV SOLN: INTRAVENOUS | Status: DC

## 2020-01-19 MED ORDER — INSULIN ASPART PROT & ASPART (70-30 MIX) 100 UNIT/ML ~~LOC~~ SUSP: 15.0000 [IU] | Freq: Two times a day (BID) | SUBCUTANEOUS | Status: DC

## 2020-01-19 MED ORDER — INSULIN ASPART PROT & ASPART (70-30 MIX) 100 UNIT/ML ~~LOC~~ SUSP
12.0000 [IU] | Freq: Two times a day (BID) | SUBCUTANEOUS | Status: DC
  Administered 2020-01-19 – 2020-01-21 (×5): 12 [IU] via SUBCUTANEOUS
  Filled 2020-01-19: qty 10

## 2020-01-19 MED ORDER — LISINOPRIL 10 MG PO TABS: 10.0000 mg | ORAL_TABLET | Freq: Every day | ORAL | Status: DC

## 2020-01-19 NOTE — Progress Notes (Signed)
   Ortho Hand Progress Note  Subjective: No acute events last night.  Pain in right hand improved   Objective: Vital signs in last 24 hours: Temp:  [97.7 F (36.5 C)-99.3 F (37.4 C)] 98.1 F (36.7 C) (11/27 0737) Pulse Rate:  [81-91] 87 (11/27 0737) Resp:  [16-19] 16 (11/27 0737) BP: (133-152)/(83-97) 152/91 (11/27 0737) SpO2:  [96 %-99 %] 96 % (11/27 0737) Weight:  [68 kg] 68 kg (11/26 1654)  Intake/Output from previous day: 11/26 0701 - 11/27 0700 In: 600 [I.V.:600] Out: 810 [Urine:800; Blood:10] Intake/Output this shift: No intake/output data recorded.  Recent Labs    01/18/20 1835 01/19/20 0157  HGB 14.7 13.0   Recent Labs    01/18/20 1835 01/19/20 0157  WBC 7.7 7.6  RBC 4.76 4.22  HCT 42.8 36.9*  PLT 210 196   Recent Labs    01/18/20 1835 01/19/20 0157  NA 132* 132*  K 3.8 3.9  CL 97* 99  CO2 23 20*  BUN 13 12  CREATININE 1.01 0.89  GLUCOSE 377* 294*  CALCIUM 9.0 8.3*   No results for input(s): LABPT, INR in the last 72 hours.  Aaox3 nad Resp nonlabored RRR RUE: Dressings changes. Volar laceration/incision with decreased erythema to the digit. No active drainage. Intact sensation to both R/U aspects of the small finger. Finger tip dusky but with good turgor.  There is capillary refill with deep pressure of the tip of the digit.   Assessment/Plan: Right small finger open PIP fracture dislocation with secondary infection status post irrigation debridement and open reduction.  Date of surgery 01/18/2020  -Dressings changed. Vessel loop drains pulled -Continue with dorsal blocking splint -CXs with GPCs and GNRs -continue broad spectrum abx -Encourage hand elevation -Remainder per primary   Cain Saupe III 01/19/2020, 12:19 PM  [phone]

## 2020-01-19 NOTE — Progress Notes (Addendum)
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  Patient reported that he fell at home and injure his right hand, lacerating the fifth digit.  Evaluation in the emergency department patient was found to have an open dislocation of the joint of the fifth digit with concern for surrounding soft tissue infection.  X-ray revealed dorsal and radial dislocation of the fifth middle phalanx with respect to the proximal phalanx suspect a small fracture fragment noted adjacent to the head of the proximal phalanx.   Patient was taken to the OR for reduction of the PIP joint, treatment of the associated fracture as well as irrigation and debridement of the area by Dr. Roney Mans.    Assessment & Plan:   Principal Problem:   Cellulitis of finger of right hand Active Problems:   Closed fracture dislocation of proximal interphalangeal (PIP) joint of finger   Uncontrolled type 2 diabetes mellitus with hyperglycemia, without long-term current use of insulin (HCC)   Essential hypertension   Alcohol abuse   1-Open right small finger PIP dislocation with volar fracture of the middle phalanx and secondary infection: -S/p irrigation and debridement of open right small finger PIP joint fracture dislocation, irrigation debridement of right a small finger flexor tendon sheath, open reduction of right small finger PIP joint, open treatment of right small finger middle phalanx volar  fracture without internal fixation by Dr. Roney Mans on 01/18/2020 -Continue IV antibiotics, cefepime and vancomycin. -Wound culture growing gram-positive cocci and  gram-negative rods.  2-Diabetes type 2, Uncontrolled, Hyperglycemia;  -HbA1c at 11.  -Patient currently does not take any medication for his diabetes as an outpatient. -Started on Lantus, sliding scale insulin, meal coverage continued. Will change to 70/30, patient might be able to afford more this insulin.   3-Hypertension: Patient was a started on lisinopril. Increase dose.   4-Alcohol abuse: Continue with CIWA protocol. Continue folic acid and thiamine.  5-Hypomagnesemia;  Replete IV  6-Hyponatremia;  Continue with IV fluids.      Estimated body mass index is 24.96 kg/m as calculated from the following:   Height as of this encounter: 5\' 5"  (1.651 m).   Weight as of this encounter: 68 kg.   DVT prophylaxis: SCDs, start Lovenox when okay by hand surgeons Code Status: Full code Family Communication: daughter updated Disposition Plan:  Status is: Observation  The patient remains OBS appropriate and will d/c before 2 midnights.  Dispo: The patient is from: Home              Anticipated d/c is to: Home              Anticipated d/c date is: 2 days              Patient currently is not medically stable to d/c. I waiting cultures result to tailor antibiotics. Also follow up by .         Consultants:   Dr Hydrographic surveyor (hand Surgeon)  Procedures:  -S/p irrigation and debridement of open right small finger PIP joint fracture dislocation, irrigation debridement of right a small finger flexor tendon sheath, open reduction of right small finger PIP joint,  open treatment of right small finger middle phalanx volar  fracture without internal fixation by Dr. Roney Mans on 01/18/2020    Antimicrobials:  Vancomycin 11/26 Cefepime 11/26  Subjective: He report pain right hand. He ran out of medications, and he doesn't have a PCP also have limitation getting medication.  I had long conversation with patient, native language about importance of Diabetes, BP controlled  and abstinence from alcohol.    Objective: Vitals:   01/18/20 2230 01/18/20 2245 01/18/20 2302 01/19/20 0347  BP: 137/83 133/84 (!) 150/97 (!) 143/91  Pulse: 81 81 82 89  Resp: Temp:   97.7 F (36.5 C) 99.3 F (37.4 C)  TempSrc:   Oral Oral  SpO2: 98% 98% 99% 97%  Weight:      Height:        Intake/Output Summary (Last 24 hours) at 01/19/2020 0704 Last data filed at 01/19/2020 0300 Gross per 24 hour  Intake 600 ml  Output 810 ml  Net -210 ml   Filed Weights   01/18/20 1654  Weight: 68 kg    Examination:  General exam: Appears calm and comfortable  Respiratory system: Clear to auscultation. Respiratory effort normal. Cardiovascular system: S1 & S2 heard, RRR. No JVD, murmurs, rubs, gallops or clicks. No pedal edema. Gastrointestinal system: Abdomen is nondistended, soft and nontender. No organomegaly or masses felt. Normal bowel sounds heard. Central nervous system: Alert and oriented. No focal neurological deficits. Extremities: Right hand with dressing   Data Reviewed: I have personally reviewed following labs and imaging studies  CBC: Recent Labs  Lab 01/18/20 1835 01/19/20 0157  WBC 7.7 7.6  NEUTROABS 5.8 5.4  HGB 14.7 13.0  HCT 42.8 36.9*  MCV 89.9 87.4  PLT 210 196   Basic Metabolic Panel: Recent Labs  Lab 01/18/20 1835 01/19/20 0157  NA 132* 132*  K 3.8 3.9  CL 97* 99  CO2 23 20*  GLUCOSE 377* 294*  BUN 13 12  CREATININE 1.01 0.89  CALCIUM 9.0 8.3*  MG  --  1.6*  PHOS  --  2.6   GFR: Estimated Creatinine Clearance: 72.9 mL/min (by C-G formula based on SCr of 0.89 mg/dL). Liver Function Tests: Recent Labs  Lab 01/18/20 1835 01/19/20 0157  AST 21 18  ALT 21 18  ALKPHOS 97 84  BILITOT 1.0 0.9  PROT 7.1 6.1*  ALBUMIN 3.6 3.1*   No results for input(s): LIPASE, AMYLASE in the last 168 hours. No results for input(s): AMMONIA in the last 168 hours. Coagulation Profile: No results for input(s): INR, PROTIME in the last  168 hours. Cardiac Enzymes: No results for input(s): CKTOTAL, CKMB, CKMBINDEX, TROPONINI in the last 168 hours. BNP (last 3 results) No results for input(s): PROBNP in the last 8760 hours. HbA1C: Recent Labs    01/19/20 0157  HGBA1C 11.8*   CBG: Recent Labs  Lab 01/18/20 2227 01/18/20 2304 01/19/20 0647  GLUCAP 246* 216* 275*   Lipid Profile: No results for input(s): CHOL, HDL, LDLCALC, TRIG, CHOLHDL, LDLDIRECT in the last 72 hours. Thyroid Function Tests: No results for input(s): TSH, T4TOTAL, FREET4, T3FREE, THYROIDAB in the last 72 hours. Anemia Panel: No results for input(s): VITAMINB12, FOLATE, FERRITIN, TIBC, IRON, RETICCTPCT in the last 72 hours. Sepsis Labs: No results for input(s): PROCALCITON, LATICACIDVEN in the last 168 hours.  Recent Results (from the past 240 hour(s))  Resp Panel by RT-PCR (Flu A&B, Covid) Nasopharyngeal Swab     Status: None   Collection  Time: 01/18/20  6:55 PM   Specimen: Nasopharyngeal Swab; Nasopharyngeal(NP) swabs in vial transport medium  Result Value Ref Range Status   SARS Coronavirus 2 by RT PCR NEGATIVE NEGATIVE Final    Comment: (NOTE) SARS-CoV-2 target nucleic acids are NOT DETECTED.  The SARS-CoV-2 RNA is generally detectable in upper respiratory specimens during the acute phase of infection. The lowest concentration of SARS-CoV-2 viral copies this assay can detect is 138 copies/mL. A negative result does not preclude SARS-Cov-2 infection and should not be used as the sole basis for treatment or other patient management decisions. A negative result may occur with  improper specimen collection/handling, submission of specimen other than nasopharyngeal swab, presence of viral mutation(s) within the areas targeted by this assay, and inadequate number of viral copies(<138 copies/mL). A negative result must be combined with clinical observations, patient history, and epidemiological information. The expected result is  Negative.  Fact Sheet for Patients:  BloggerCourse.com  Fact Sheet for Healthcare Providers:  SeriousBroker.it  This test is no t yet approved or cleared by the [place] FDA and  has been authorized for detection and/or diagnosis of SARS-CoV-2 by FDA under an Emergency Use Authorization (EUA). This EUA will remain  in effect (meaning this test can be used) for the duration of the COVID-19 declaration under Section 564(b)(1) of the Act, 21 U.S.C.section 360bbb-3(b)(1), unless the authorization is terminated  or revoked sooner.       Influenza A by PCR NEGATIVE NEGATIVE Final   Influenza B by PCR NEGATIVE NEGATIVE Final    Comment: (NOTE) The Xpert Xpress SARS-CoV-2/FLU/RSV plus assay is intended as an aid in the diagnosis of influenza from Nasopharyngeal swab specimens and should not be used as a sole basis for treatment. Nasal washings and aspirates are unacceptable for Xpert Xpress SARS-CoV-2/FLU/RSV testing.  Fact Sheet for Patients: BloggerCourse.com  Fact Sheet for Healthcare Providers: SeriousBroker.it  This test is not yet approved or cleared by the [place] FDA and has been authorized for detection and/or diagnosis of SARS-CoV-2 by FDA under an Emergency Use Authorization (EUA). This EUA will remain in effect (meaning this test can be used) for the duration of the COVID-19 declaration under Section 564(b)(1) of the Act, 21 U.S.C. section 360bbb-3(b)(1), unless the authorization is terminated or revoked.  Performed at [facility], [address], [place], [place] [address]   Aerobic/Anaerobic Culture (surgical/deep wound)     Status: None (Preliminary result)   Collection Time: 01/18/20  9:40 PM   Specimen: Wound  Result Value Ref Range Status   Specimen Description WOUND  Final   Special Requests RIGHT SMALL FINGER  Final   Gram Stain   Final     FEW WBC PRESENT,BOTH PMN AND MONONUCLEAR ABUNDANT GRAM POSITIVE COCCI FEW GRAM NEGATIVE RODS Performed at [facility], [address] [place], [place] [address]    Culture PENDING  Incomplete   Report Status PENDING  Incomplete         Radiology Studies: CT Head Wo Contrast  Result Date: 01/18/2020 CLINICAL DATA:  64 year old male with facial trauma. EXAM: CT HEAD WITHOUT CONTRAST CT MAXILLOFACIAL WITHOUT CONTRAST CT CERVICAL SPINE WITHOUT CONTRAST TECHNIQUE: Multidetector CT imaging of the head, cervical spine, and maxillofacial structures were performed using the standard protocol without intravenous contrast. Multiplanar CT image reconstructions of the cervical spine and maxillofacial structures were also generated. COMPARISON:  Brain MRI dated 06/20/2017. FINDINGS: CT HEAD FINDINGS Brain: The ventricles and sulci appropriate size for patient's  age. 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 There is no acute intracranial hemorrhage. No mass effect midline shift no extra-axial fluid collection. Vascular: No hyperdense vessel or unexpected calcification. Skull: Normal. Negative for fracture or focal lesion. Other: None CT MAXILLOFACIAL FINDINGS Osseous: There is a fracture of the right nasal bone. No other acute fracture. No mandibular subluxation. Orbits: The globes and retro-orbital fat are preserved. Sinuses: Mild mucoperiosteal thickening of paranasal sinuses. No air-fluid level. The mastoid air cells are clear. Soft tissues: Mild soft tissue swelling over the nose. CT CERVICAL SPINE FINDINGS Alignment: No acute subluxation. Skull base and vertebrae: No acute fracture. Soft tissues and spinal canal: No prevertebral fluid or swelling. No visible canal hematoma. Disc levels: Degenerative changes with anterior osteophyte primarily at C3-C4 and C4-C5. Upper chest: Negative. Other: None IMPRESSION: 1. No acute intracranial pathology. 2. No acute/traumatic  cervical spine pathology. 3. Right nasal bone fracture. Electronically Signed   By: Elgie CollardArash  Radparvar M.D.   On: 01/18/2020 19:50   CT Cervical Spine Wo Contrast  Result Date: 01/18/2020 CLINICAL DATA:  64 year old male with facial trauma. EXAM: CT HEAD WITHOUT CONTRAST CT MAXILLOFACIAL WITHOUT CONTRAST CT CERVICAL SPINE WITHOUT CONTRAST TECHNIQUE: Multidetector CT imaging of the head, cervical spine, and maxillofacial structures were performed using the standard protocol without intravenous contrast. Multiplanar CT image reconstructions of the cervical spine and maxillofacial structures were also generated. COMPARISON:  Brain MRI dated 06/20/2017. FINDINGS: CT HEAD FINDINGS Brain: The ventricles and sulci appropriate size for patient's age. 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 There is no acute intracranial hemorrhage. No mass effect midline shift no extra-axial fluid collection. Vascular: No hyperdense vessel or unexpected calcification. Skull: Normal. Negative for fracture or focal lesion. Other: None CT MAXILLOFACIAL FINDINGS Osseous: There is a fracture of the right nasal bone. No other acute fracture. No mandibular subluxation. Orbits: The globes and retro-orbital fat are preserved. Sinuses: Mild mucoperiosteal thickening of paranasal sinuses. No air-fluid level. The mastoid air cells are clear. Soft tissues: Mild soft tissue swelling over the nose. CT CERVICAL SPINE FINDINGS Alignment: No acute subluxation. Skull base and vertebrae: No acute fracture. Soft tissues and spinal canal: No prevertebral fluid or swelling. No visible canal hematoma. Disc levels: Degenerative changes with anterior osteophyte primarily at C3-C4 and C4-C5. Upper chest: Negative. Other: None IMPRESSION: 1. No acute intracranial pathology. 2. No acute/traumatic cervical spine pathology. 3. Right nasal bone fracture. Electronically Signed   By: Elgie CollardArash  Radparvar M.D.   On: 01/18/2020 19:50   DG  Finger Little Right  Result Date: 01/18/2020 CLINICAL DATA:  Fall with swelling EXAM: RIGHT LITTLE FINGER 2+V COMPARISON:  None. FINDINGS: Dorsal and radial dislocation of the fifth middle phalanx with respect to the proximal phalanx. Suspected small fracture fragments adjacent to the head of the proximal phalanx. Vascular calcifications. IMPRESSION: Dorsal and radial dislocation of the fifth middle phalanx with respect to the proximal phalanx with suspected small fracture fragments adjacent to the head of the proximal phalanx Electronically Signed   By: Jasmine PangKim  Fujinaga M.D.   On: 01/18/2020 17:55   DG MINI C-ARM IMAGE ONLY  Result Date: 01/18/2020 There is no interpretation for this exam.  This order is for images obtained during a surgical procedure.  Please See "Surgeries" Tab for more information regarding the procedure.   CT Maxillofacial Wo Contrast  Result Date: 01/18/2020 CLINICAL DATA:  64 year old male with facial trauma. EXAM: CT HEAD WITHOUT CONTRAST CT MAXILLOFACIAL WITHOUT CONTRAST CT  CERVICAL SPINE WITHOUT CONTRAST TECHNIQUE: Multidetector CT imaging of the head, cervical spine, and maxillofacial structures were performed using the standard protocol without intravenous contrast. Multiplanar CT image reconstructions of the cervical spine and maxillofacial structures were also generated. COMPARISON:  Brain MRI dated 06/20/2017. FINDINGS: CT HEAD FINDINGS Brain: The ventricles and sulci appropriate size for patient's age. 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 There is no acute intracranial hemorrhage. No mass effect midline shift no extra-axial fluid collection. Vascular: No hyperdense vessel or unexpected calcification. Skull: Normal. Negative for fracture or focal lesion. Other: None CT MAXILLOFACIAL FINDINGS Osseous: There is a fracture of the right nasal bone. No other acute fracture. No mandibular subluxation. Orbits: The globes and retro-orbital fat  are preserved. Sinuses: Mild mucoperiosteal thickening of paranasal sinuses. No air-fluid level. The mastoid air cells are clear. Soft tissues: Mild soft tissue swelling over the nose. CT CERVICAL SPINE FINDINGS Alignment: No acute subluxation. Skull base and vertebrae: No acute fracture. Soft tissues and spinal canal: No prevertebral fluid or swelling. No visible canal hematoma. Disc levels: Degenerative changes with anterior osteophyte primarily at C3-C4 and C4-C5. Upper chest: Negative. Other: None IMPRESSION: 1. No acute intracranial pathology. 2. No acute/traumatic cervical spine pathology. 3. Right nasal bone fracture. Electronically Signed   By: Elgie Collard M.D.   On: 01/18/2020 19:50        Scheduled Meds: . folic acid  1 mg Oral Daily  . insulin aspart  0-15 Units Subcutaneous TID AC & HS  . insulin aspart  4 Units Subcutaneous TID WC  . insulin glargine  15 Units Subcutaneous QHS  . lisinopril  10 mg Oral Daily  . multivitamin with minerals  1 tablet Oral Daily  . thiamine  100 mg Oral Daily   Or  . thiamine  100 mg Intravenous Daily   Continuous Infusions: . sodium chloride 250 mL/hr at 01/18/20 1928  . ceFEPime (MAXIPIME) IV 2 g (01/19/20 0550)  . lactated ringers 125 mL/hr at 01/18/20 2333  . vancomycin 750 mg (01/19/20 0027)     LOS: 0 days    Time spent: 35 minutes.     Alba Cory, MD [facility]   If 7PM-7AM, please contact night-coverage [email or web address]  01/19/2020, 7:04 AM

## 2020-01-19 NOTE — H&P (Addendum)
History and Physical    Ryan Mcbride TMA:[id number] DOB: [birth year] DOA: 01/18/2020  PCP: Patient, [name]  Patient coming from:    Chief Complaint:  Chief Complaint  Patient presents with  . 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  Patient is an extremely poor historian.  Patient explains that 2 days ago after drinking approximately 5 beers he was walking around outside and suddenly fell.  Patient is unable to explain why he fell but denies loss of consciousness or head trauma.  States that at that time he had injured his right hand, lacerating the fifth digit.  Patient states that because of his hand he eventually presented to [facility] emergency department on 11/26 brought in by his daughter.  When asked what symptoms he had been experiencing of the right hand over the past 2 days he endorses some swelling but denies significant pain.  Patient states that his daughter is the one who insisted he come in.  Upon evaluation in the emergency department, patient was found to have an open dislocation of the joint of the fifth digit with concern for surrounding soft tissue infection.  Patient was administered intravenous ceftriaxone by the emergency department staff.  X-ray of that hand revealed dorsal and radial dislocation of the fifth middle phalanx with respect to the proximal phalanx suspect small fracture fragments noted adjacent to the head of the proximal phalanx.  Dr. Roney Mans with orthopedic hand surgery was immediately consulted who graciously took the patient to the operating room for intervention including reduction of the PIP joint, treatment of the  associated fracture as well as irrigation and debridement of the area.  The hospitalist group was then called to assess the patient for mission to the hospital due to the patient's known history of poorly controlled diabetes mellitus.   Review of Systems:   Review of Systems  Musculoskeletal: Positive for falls and joint pain.  All other systems reviewed and are negative.   History reviewed. No pertinent past medical history.  Past Surgical History:  Procedure Laterality Date  . amputation left leg       reports that he has never smoked. He has never used smokeless tobacco. He reports current alcohol use. He reports that he does not use drugs.  No Known Allergies  History reviewed. No pertinent family history.   Prior to Admission medications   Medication Sig Start Date End Date Taking? Authorizing Provider  folic acid (FOLVITE) 1 MG tablet Take 1 tablet (1 mg total) by mouth daily. 06/23/17   Hongalgi, Maximino Greenland, MD  glimepiride (AMARYL) 1 MG tablet Take 1 tablet (1 mg total) by mouth daily with breakfast. 06/23/17   Hongalgi, Maximino Greenland, MD  lisinopril (PRINIVIL,ZESTRIL) 20 MG tablet Take 1 tablet (20 mg total) by mouth daily. 06/22/17   Hongalgi, Maximino Greenland, MD  metFORMIN (GLUCOPHAGE) 1000 MG tablet Take 1 tablet (1,000 mg total) by mouth 2 (two) times daily with a meal. 06/22/17   Hongalgi, Maximino Greenland, MD  Multiple Vitamin (MULTIVITAMIN WITH MINERALS) TABS tablet Take 1 tablet by mouth daily. 06/23/17   Hongalgi, Maximino Greenland, MD  thiamine 100 MG tablet Take 1 tablet (100 mg total)  by mouth daily. 06/23/17   Elease Etienne, MD    Physical Exam: Vitals:   01/18/20 2227 01/18/20 2230 01/18/20 2245 01/18/20 2302  BP: 134/88 137/83 133/84 (!) 150/97  Pulse: 81 81 81 82  Resp: 19 16 18 16   Temp: 98.5 F (36.9 C)   97.7 F (36.5 C)  TempSrc:    Oral  SpO2: 97% 98% 98% 99%  Weight:      Height:        Constitutional: Acute alert and oriented x3, no associated distress.   Skin: Dressing of the  right hand is clean dry and intact.  No notable rashes or lesions noted. Good skin turgor noted. Eyes: Pupils are equally reactive to light.  No evidence of scleral icterus or conjunctival pallor.  ENMT: Moist mucous membranes noted.  Posterior pharynx clear of any exudate or lesions.   Neck: normal, supple, no masses, no thyromegaly.  No evidence of jugular venous distension.   Respiratory: clear to auscultation bilaterally, no wheezing, no crackles. Normal respiratory effort. No accessory muscle use.  Cardiovascular: Regular rate and rhythm, no murmurs / rubs / gallops. No extremity edema. 2+ pedal pulses. No carotid bruits.  Chest:   Nontender without crepitus or deformity.   Back:   Nontender without crepitus or deformity. Abdomen: Abdomen is soft and nontender.  No evidence of intra-abdominal masses.  Positive bowel sounds noted in all quadrants.   Musculoskeletal: As mentioned above, dressing of the right hand is clean dry and intact.  Notable left BKA.  Status post amputation of the fourth and fifth right toes.  No other deformities noted.  No contractures. Normal muscle tone.  Neurologic: CN 2-12 grossly intact. Sensation intact.  Patient moving all 4 extremities spontaneously.  Patient is following all commands.  Patient is responsive to verbal stimuli.   Psychiatric: Patient exhibits normal mood with flat affect.  Patient seems to possess insight as to their current situation.     Labs on Admission: I have personally reviewed following labs and imaging studies -   CBC: Recent Labs  Lab 01/18/20 1835  WBC 7.7  NEUTROABS 5.8  HGB 14.7  HCT 42.8  MCV 89.9  PLT 210   Basic Metabolic Panel: Recent Labs  Lab 01/18/20 1835  NA 132*  K 3.8  CL 97*  CO2 23  GLUCOSE 377*  BUN 13  CREATININE 1.01  CALCIUM 9.0   GFR: Estimated Creatinine Clearance: 64.3 mL/min (by C-G formula based on SCr of 1.01 mg/dL). Liver Function Tests: Recent Labs  Lab 01/18/20 1835  AST 21  ALT 21   ALKPHOS 97  BILITOT 1.0  PROT 7.1  ALBUMIN 3.6   No results for input(s): LIPASE, AMYLASE in the last 168 hours. No results for input(s): AMMONIA in the last 168 hours. Coagulation Profile: No results for input(s): INR, PROTIME in the last 168 hours. Cardiac Enzymes: No results for input(s): CKTOTAL, CKMB, CKMBINDEX, TROPONINI in the last 168 hours. BNP (last 3 results) No results for input(s): PROBNP in the last 8760 hours. HbA1C: No results for input(s): HGBA1C in the last 72 hours. CBG: Recent Labs  Lab 01/18/20 2227 01/18/20 2304  GLUCAP 246* 216*   Lipid Profile: No results for input(s): CHOL, HDL, LDLCALC, TRIG, CHOLHDL, LDLDIRECT in the last 72 hours. Thyroid Function Tests: No results for input(s): TSH, T4TOTAL, FREET4, T3FREE, THYROIDAB in the last 72 hours. Anemia Panel: No results for input(s): VITAMINB12, FOLATE, FERRITIN, TIBC, IRON, RETICCTPCT in the last 72 hours.  Urine analysis:    Component Value Date/Time   COLORURINE STRAW (A) 06/18/2017 2333   APPEARANCEUR CLEAR 06/18/2017 2333   LABSPEC 1.026 06/18/2017 2333   PHURINE 6.0 06/18/2017 2333   GLUCOSEU >=500 (A) 06/18/2017 2333   HGBUR NEGATIVE 06/18/2017 2333   BILIRUBINUR NEGATIVE 06/18/2017 2333   KETONESUR 5 (A) 06/18/2017 2333   PROTEINUR NEGATIVE 06/18/2017 2333   NITRITE NEGATIVE 06/18/2017 2333   LEUKOCYTESUR NEGATIVE 06/18/2017 2333    Radiological Exams on Admission - Personally Reviewed: CT Head Wo Contrast  Result Date: 01/18/2020 CLINICAL DATA:  64 year old male with facial trauma. EXAM: CT HEAD WITHOUT CONTRAST CT MAXILLOFACIAL WITHOUT CONTRAST CT CERVICAL SPINE WITHOUT CONTRAST TECHNIQUE: Multidetector CT imaging of the head, cervical spine, and maxillofacial structures were performed using the standard protocol without intravenous contrast. Multiplanar CT image reconstructions of the cervical spine and maxillofacial structures were also generated. COMPARISON:  Brain MRI dated  06/20/2017. FINDINGS: CT HEAD FINDINGS Brain: The ventricles and sulci appropriate size for patient's age. 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 There is no acute intracranial hemorrhage. No mass effect midline shift no extra-axial fluid collection. Vascular: No hyperdense vessel or unexpected calcification. Skull: Normal. Negative for fracture or focal lesion. Other: None CT MAXILLOFACIAL FINDINGS Osseous: There is a fracture of the right nasal bone. No other acute fracture. No mandibular subluxation. Orbits: The globes and retro-orbital fat are preserved. Sinuses: Mild mucoperiosteal thickening of paranasal sinuses. No air-fluid level. The mastoid air cells are clear. Soft tissues: Mild soft tissue swelling over the nose. CT CERVICAL SPINE FINDINGS Alignment: No acute subluxation. Skull base and vertebrae: No acute fracture. Soft tissues and spinal canal: No prevertebral fluid or swelling. No visible canal hematoma. Disc levels: Degenerative changes with anterior osteophyte primarily at C3-C4 and C4-C5. Upper chest: Negative. Other: None IMPRESSION: 1. No acute intracranial pathology. 2. No acute/traumatic cervical spine pathology. 3. Right nasal bone fracture. Electronically Signed   By: Elgie Collard M.D.   On: 01/18/2020 19:50   CT Cervical Spine Wo Contrast  Result Date: 01/18/2020 CLINICAL DATA:  64 year old male with facial trauma. EXAM: CT HEAD WITHOUT CONTRAST CT MAXILLOFACIAL WITHOUT CONTRAST CT CERVICAL SPINE WITHOUT CONTRAST TECHNIQUE: Multidetector CT imaging of the head, cervical spine, and maxillofacial structures were performed using the standard protocol without intravenous contrast. Multiplanar CT image reconstructions of the cervical spine and maxillofacial structures were also generated. COMPARISON:  Brain MRI dated 06/20/2017. FINDINGS: CT HEAD FINDINGS Brain: The ventricles and sulci appropriate size for patient's age. 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 There is no acute intracranial hemorrhage. No mass effect midline shift no extra-axial fluid collection. Vascular: No hyperdense vessel or unexpected calcification. Skull: Normal. Negative for fracture or focal lesion. Other: None CT MAXILLOFACIAL FINDINGS Osseous: There is a fracture of the right nasal bone. No other acute fracture. No mandibular subluxation. Orbits: The globes and retro-orbital fat are preserved. Sinuses: Mild mucoperiosteal thickening of paranasal sinuses. No air-fluid level. The mastoid air cells are clear. Soft tissues: Mild soft tissue swelling over the nose. CT CERVICAL SPINE FINDINGS Alignment: No acute subluxation. Skull base and vertebrae: No acute fracture. Soft tissues and spinal canal: No prevertebral fluid or swelling. No visible canal hematoma. Disc levels: Degenerative changes with anterior osteophyte primarily at C3-C4 and C4-C5. Upper chest: Negative. Other: None IMPRESSION: 1. No acute intracranial pathology. 2. No acute/traumatic cervical spine pathology. 3. Right nasal bone fracture. Electronically Signed   By: Elgie Collard  M.D.   On: 01/18/2020 19:50   DG Finger Little Right  Result Date: 01/18/2020 CLINICAL DATA:  Fall with swelling EXAM: RIGHT LITTLE FINGER 2+V COMPARISON:  None. FINDINGS: Dorsal and radial dislocation of the fifth middle phalanx with respect to the proximal phalanx. Suspected small fracture fragments adjacent to the head of the proximal phalanx. Vascular calcifications. IMPRESSION: Dorsal and radial dislocation of the fifth middle phalanx with respect to the proximal phalanx with suspected small fracture fragments adjacent to the head of the proximal phalanx Electronically Signed   By: Jasmine Pang M.D.   On: 01/18/2020 17:55   DG MINI C-ARM IMAGE ONLY  Result Date: 01/18/2020 There is no interpretation for this exam.  This order is for images obtained during a surgical procedure.  Please See  "Surgeries" Tab for more information regarding the procedure.   CT Maxillofacial Wo Contrast  Result Date: 01/18/2020 CLINICAL DATA:  64 year old male with facial trauma. EXAM: CT HEAD WITHOUT CONTRAST CT MAXILLOFACIAL WITHOUT CONTRAST CT CERVICAL SPINE WITHOUT CONTRAST TECHNIQUE: Multidetector CT imaging of the head, cervical spine, and maxillofacial structures were performed using the standard protocol without intravenous contrast. Multiplanar CT image reconstructions of the cervical spine and maxillofacial structures were also generated. COMPARISON:  Brain MRI dated 06/20/2017. FINDINGS: CT HEAD FINDINGS Brain: The ventricles and sulci appropriate size for patient's age. 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 There is no acute intracranial hemorrhage. No mass effect midline shift no extra-axial fluid collection. Vascular: No hyperdense vessel or unexpected calcification. Skull: Normal. Negative for fracture or focal lesion. Other: None CT MAXILLOFACIAL FINDINGS Osseous: There is a fracture of the right nasal bone. No other acute fracture. No mandibular subluxation. Orbits: The globes and retro-orbital fat are preserved. Sinuses: Mild mucoperiosteal thickening of paranasal sinuses. No air-fluid level. The mastoid air cells are clear. Soft tissues: Mild soft tissue swelling over the nose. CT CERVICAL SPINE FINDINGS Alignment: No acute subluxation. Skull base and vertebrae: No acute fracture. Soft tissues and spinal canal: No prevertebral fluid or swelling. No visible canal hematoma. Disc levels: Degenerative changes with anterior osteophyte primarily at C3-C4 and C4-C5. Upper chest: Negative. Other: None IMPRESSION: 1. No acute intracranial pathology. 2. No acute/traumatic cervical spine pathology. 3. Right nasal bone fracture. Electronically Signed   By: Elgie Collard M.D.   On: 01/18/2020 19:50   Assessment/Plan Principal Problem:   Cellulitis of finger of right  hand   Patient has been developing evidence of soft tissue infection of the fifth digit of the right hand developing status post fall, laceration and open fracture of that digit with PIP dislocation  Considering patient's poorly controlled diabetes, initially treating patient with broad-spectrum intravenous antibiotic therapy with intravenous cefepime and vancomycin until blood cultures and wound cultures are finalized  Patient is status post operative intervention including irrigation and debridement of the wound, reduction of the dislocation of the hip joint as well as treatment of the small anger middle phalanx lip fracture without fixation.   Providing patient with as needed opiate-based analgesics for associated pain  Hydrating patient with intravenous isotonic fluids  Local care per orthopedics recommendations  Active Problems:   Closed fracture dislocation of proximal interphalangeal (PIP) joint of finger   Repaired intraoperatively by Dr. Roney Mans with orthopedic hand surgery  Immediate postoperative care per Ortho hand recommendations  Will likely need outpatient follow-up with Ortho hand    Uncontrolled type 2 diabetes mellitus with hyperglycemia, without long-term current use of insulin (HCC)  Patient arriving to the emergency department blood sugars over 300  Per review of notes from 2019, patient has developed DKA in the past and was strongly recommended at that time to be on insulin but patient declined  Patient states that he currently does not take any medications for his diabetes  We will place patient on a basal bolus insulin regimen including Lantus to 15 units nightly as well as 4 units of lispro before every meal  Accu-Cheks before every meal and nightly with sliding scale insulin  Hemoglobin A1c pending  Patient would likely benefit from diabetic education prior to discharge    Essential hypertension   Patient additionally has been diagnosed with  hypertension in the past but states that he is currently not taking any medications  Patient is slightly hypertensive here  Place patient on low-dose lisinopril  Alcohol abuse  Patient is a longstanding history of alcohol abuse  Patient states that he is now drinking upwards of 7 beers daily at least 3 to 4 days a week but is not seemingly forthcoming about his drinking habits  No obvious evidence of withdrawal at this point but will initiate CIWA protocol in case of alcohol withdrawal  Counseling patient on cessation  Code Status:  Full code Family Communication: Attempted to call the patient's daughter via phone but have been unsuccessful.  Status is: Observation  The patient remains OBS appropriate and will d/c before 2 midnights.  Dispo: The patient is from: Home              Anticipated d/c is to: Home              Anticipated d/c date is: 2 days              Patient currently is not medically stable to d/c.        Marinda ElkGeorge J Tristina Sahagian MD [facility] Pager [phone]- [phone]  If 7PM-7AM, please contact night-coverage [email or web address] Use universal [place] password for that web site. If you do not have the password, please call the hospital operator.  01/19/2020, 12:16 AM

## 2020-01-20 LAB — GLUCOSE, CAPILLARY
Glucose-Capillary: 261 mg/dL — ABNORMAL HIGH (ref 70–99)
Glucose-Capillary: 158 mg/dL — ABNORMAL HIGH (ref 70–99)
Glucose-Capillary: 251 mg/dL — ABNORMAL HIGH (ref 70–99)
Glucose-Capillary: 158 mg/dL — ABNORMAL HIGH (ref 70–99)

## 2020-01-20 LAB — VANCOMYCIN, TROUGH: Vancomycin Tr: 37 ug/mL (ref 15–20)

## 2020-01-20 NOTE — Progress Notes (Signed)
Pharmacy Antibiotic Note  Ryan Mcbride is a 64 y.o. male who fell 2 days ago admitted on 01/18/2020 with right 5th finger pain and right nasal bone fracture.  Underwent I&D and reduction of the PIP joint on 11/26.  Pharmacy has been consulted for vancomycin and cefepime dosing for cellulitis.  Patient was scheduled to receive vancomycin trough prior to dose this morning; however, trough was drawn during vancomycin infusion. Results will be falsely elevated. Will defer repeating trough given OR culture pending and potential for deescalation soon. Renal function is stable right now, so will continue patient on current dosing regimen. Noted consistent UOP yesterday as well.   Plan: Continue vanc 750mg  IV Q12H for goal trough 10-15 mcg/mL Continue cefepime 2gm IV Q8H Monitor renal fxn, micro data to de-escalate vs check vanc trough  Height: 5\' 5"  (165.1 cm) Weight: 68 kg (150 lb) IBW/kg (Calculated) : 61.5  Temp (24hrs), Avg:98.5 F (36.9 C), Min:97.8 F (36.6 C), Max:99.2 F (37.3 C)  Recent Labs  Lab 01/18/20 1835 01/19/20 0157 01/20/20 0959  WBC 7.7 7.6  --   CREATININE 1.01 0.89  --   VANCOTROUGH  --   --  37*    Estimated Creatinine Clearance: 72.9 mL/min (by C-G formula based on SCr of 0.89 mg/dL).    No Known Allergies  CTX x1 11/26 Vanc 11/26 >> Cefepime 11/26 >>  11/26 OR wound cx - GPCs, GNRs, s/s pending  12/26, PharmD PGY2 ID Pharmacy Resident Phone between 7 am - 3:30 pm: 12/26  Please check AMION for all [facility] phone numbers After 10:00 PM, call Main Pharmacy [phone]  01/20/2020, 11:06 AM

## 2020-01-20 NOTE — Plan of Care (Signed)
  Problem: Activity: Goal: Ability to tolerate increased activity will improve Outcome: Progressing   Problem: Education: Goal: Verbalization of understanding the information provided will improve Outcome: Progressing   Problem: Coping: Goal: Level of anxiety will decrease Outcome: Progressing   Problem: Physical Regulation: Goal: Postoperative complications will be avoided or minimized Outcome: Progressing   Problem: Pain Management: Goal: Pain level will decrease Outcome: Progressing   Problem: Skin Integrity: Goal: Signs of wound healing will improve Outcome: Progressing   Problem: Tissue Perfusion: Goal: Ability to maintain adequate tissue perfusion will improve Outcome: Progressing

## 2020-01-20 NOTE — Progress Notes (Signed)
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  Patient reported that he fell at home and injure his right hand, lacerating the fifth digit.  Evaluation in the emergency department patient was found to have an open dislocation of the joint of the fifth digit with concern for surrounding soft tissue infection.  X-ray revealed dorsal and radial dislocation of the fifth middle phalanx with respect to the proximal phalanx suspect a small fracture fragment noted adjacent to the head of the proximal phalanx.   Patient was taken to the OR for reduction of the PIP joint, treatment of the associated fracture as well as irrigation and debridement of the area by Dr. Roney Mans.    Assessment & Plan:   Principal Problem:   Cellulitis of finger of right hand Active Problems:   Closed fracture dislocation of proximal interphalangeal (PIP) joint of finger   Uncontrolled type 2 diabetes mellitus with hyperglycemia, without long-term current use of insulin (HCC)   Essential hypertension   Alcohol abuse   1-Open right small finger PIP dislocation with volar fracture of the middle phalanx and secondary infection: -S/p irrigation and debridement of open right small finger PIP joint fracture dislocation, irrigation debridement of right a small finger flexor tendon sheath, open reduction of right small finger PIP joint, open treatment of right small finger middle phalanx volar  fracture without internal fixation by Dr. Roney Mans on 01/18/2020 -Continue IV antibiotics, cefepime and vancomycin. -Wound culture growing gram-positive cocci and  gram-negative rods. Result pending.  -Awaiting culture result.   2-Diabetes type 2, Uncontrolled, Hyperglycemia;  -HbA1c at 11.  -Patient currently does not take any medication for his diabetes as an outpatient. -Started on Lantus, sliding scale insulin, meal coverage continued.  change to 70/30, patient might be able to afford  this insulin.  CBG better controlled.   3-Hypertension: Patient was a started on lisinopril. Increase dose.   4-Alcohol abuse: Continue with CIWA protocol. Continue folic acid and thiamine.  5-Hypomagnesemia;  Replaced. Repeat labs tomorrow.   6-Hyponatremia;  Continue with IV fluids.      Estimated body mass index is 24.96 kg/m as calculated from the following:   Height as of this encounter: 5\' 5"  (1.651 m).   Weight as of this encounter: 68 kg.   DVT prophylaxis: SCDs, start Lovenox when okay by hand surgeons Code Status: Full code Family Communication: daughter updated Disposition Plan:  Status is: Observation  The patient remains OBS appropriate and will d/c before 2 midnights.  Dispo: The patient is from: Home              Anticipated d/c is to: Home              Anticipated d/c date is: 2 days              Patient currently is not medically stable to d/c.  waiting cultures result to tailor antibiotics. Also follow up by .         Consultants:   Dr Hydrographic surveyor (hand Surgeon)  Procedures:  -S/p irrigation and debridement of open right small finger PIP joint fracture dislocation, irrigation debridement of right  a small finger flexor tendon sheath, open reduction of right small finger PIP joint, open treatment of right small finger middle phalanx volar  fracture without internal fixation by Dr. Roney Mans on 01/18/2020    Antimicrobials:  Vancomycin 11/26 Cefepime 11/26  Subjective: He report less hand pain today.    Objective: Vitals:   01/19/20 1800 01/19/20 2038 01/20/20 0439 01/20/20 0758  BP: 118/70 125/80  122/84 (!) 148/86  Pulse:  81 76 78  Resp:  Temp:  98.7 F (37.1 C) 99.2 F (37.3 C) 98.1 F (36.7 C)  TempSrc:  Oral Oral Oral  SpO2:  100% 98% 98%  Weight:      Height:        Intake/Output Summary (Last 24 hours) at 01/20/2020 1329 Last data filed at 01/20/2020 1219 Gross per 24 hour  Intake 1580 ml  Output 940 ml  Net 640 ml   Filed Weights   01/18/20 1654  Weight: 68 kg    Examination:  General exam: NAD Respiratory system: CTA Cardiovascular system: S, 1 S 2 RRR Gastrointestinal system: BS present, soft, nt Central nervous system: ALert Extremities: Right hand with dressing   Data Reviewed: I have personally reviewed following labs and imaging studies  CBC: Recent Labs  Lab 01/18/20 1835 01/19/20 0157  WBC 7.7 7.6  NEUTROABS 5.8 5.4  HGB 14.7 13.0  HCT 42.8 36.9*  MCV 89.9 87.4  PLT 210 196   Basic Metabolic Panel: Recent Labs  Lab 01/18/20 1835 01/19/20 0157  NA 132* 132*  K 3.8 3.9  CL 97* 99  CO2 23 20*  GLUCOSE 377* 294*  BUN 13 12  CREATININE 1.01 0.89  CALCIUM 9.0 8.3*  MG  --  1.6*  PHOS  --  2.6   GFR: Estimated Creatinine Clearance: 72.9 mL/min (by C-G formula based on SCr of 0.89 mg/dL). Liver Function Tests: Recent Labs  Lab 01/18/20 1835 01/19/20 0157  AST 21 18  ALT 21 18  ALKPHOS 97 84  BILITOT 1.0 0.9  PROT 7.1 6.1*  ALBUMIN 3.6 3.1*   No results for input(s): LIPASE, AMYLASE in the last 168 hours. No results for input(s): AMMONIA in the last 168 hours. Coagulation Profile: No results for input(s): INR, PROTIME in the last 168 hours. Cardiac Enzymes: No results for input(s): CKTOTAL, CKMB, CKMBINDEX, TROPONINI in the last 168 hours. BNP (last 3 results) No results for input(s): PROBNP in the last 8760 hours. HbA1C: Recent Labs    01/19/20 0157  HGBA1C 11.8*   CBG: Recent Labs  Lab 01/19/20 1355 01/19/20 1611 01/19/20 2036 01/20/20 0654 01/20/20 1109  GLUCAP 145* 210* 119* 261* 158*    Lipid Profile: No results for input(s): CHOL, HDL, LDLCALC, TRIG, CHOLHDL, LDLDIRECT in the last 72 hours. Thyroid Function Tests: No results for input(s): TSH, T4TOTAL, FREET4, T3FREE, THYROIDAB in the last 72 hours. Anemia Panel: No results for input(s): VITAMINB12, FOLATE, FERRITIN, TIBC, IRON, RETICCTPCT in the last 72 hours. Sepsis Labs: No results for input(s): PROCALCITON, LATICACIDVEN in the last 168 hours.  Recent Results (from the past 240 hour(s))  Resp Panel by RT-PCR (Flu A&B, Covid) Nasopharyngeal Swab     Status: None   Collection Time: 01/18/20  6:55 PM   Specimen: Nasopharyngeal Swab; Nasopharyngeal(NP) swabs in vial transport medium  Result Value Ref Range Status   SARS Coronavirus 2 by RT PCR NEGATIVE NEGATIVE Final    Comment: (NOTE) SARS-CoV-2 target nucleic acids are NOT DETECTED.  The SARS-CoV-2  RNA is generally detectable in upper respiratory specimens during the acute phase of infection. The lowest concentration of SARS-CoV-2 viral copies this assay can detect is 138 copies/mL. A negative result does not preclude SARS-Cov-2 infection and should not be used as the sole basis for treatment or other patient management decisions. A negative result may occur with  improper specimen collection/handling, submission of specimen other than nasopharyngeal swab, presence of viral mutation(s) within the areas targeted by this assay, and inadequate number of viral copies(<138 copies/mL). A negative result must be combined with clinical observations, patient history, and epidemiological information. The expected result is Negative.  Fact Sheet for Patients:  BloggerCourse.com  Fact Sheet for Healthcare Providers:  SeriousBroker.it  This test is no t yet approved or cleared by the [place] FDA and  has been authorized for detection and/or diagnosis of SARS-CoV-2 by FDA under an Emergency Use Authorization  (EUA). This EUA will remain  in effect (meaning this test can be used) for the duration of the COVID-19 declaration under Section 564(b)(1) of the Act, 21 U.S.C.section 360bbb-3(b)(1), unless the authorization is terminated  or revoked sooner.       Influenza A by PCR NEGATIVE NEGATIVE Final   Influenza B by PCR NEGATIVE NEGATIVE Final    Comment: (NOTE) The Xpert Xpress SARS-CoV-2/FLU/RSV plus assay is intended as an aid in the diagnosis of influenza from Nasopharyngeal swab specimens and should not be used as a sole basis for treatment. Nasal washings and aspirates are unacceptable for Xpert Xpress SARS-CoV-2/FLU/RSV testing.  Fact Sheet for Patients: BloggerCourse.com  Fact Sheet for Healthcare Providers: SeriousBroker.it  This test is not yet approved or cleared by the [place] FDA and has been authorized for detection and/or diagnosis of SARS-CoV-2 by FDA under an Emergency Use Authorization (EUA). This EUA will remain in effect (meaning this test can be used) for the duration of the COVID-19 declaration under Section 564(b)(1) of the Act, 21 U.S.C. section 360bbb-3(b)(1), unless the authorization is terminated or revoked.  Performed at [facility], [address] [place], [place] [address]   Aerobic/Anaerobic Culture (surgical/deep wound)     Status: None (Preliminary result)   Collection Time: 01/18/20  9:40 PM   Specimen: Wound  Result Value Ref Range Status   Specimen Description WOUND  Final   Special Requests RIGHT SMALL FINGER  Final   Gram Stain   Final    FEW WBC PRESENT,BOTH PMN AND MONONUCLEAR ABUNDANT GRAM POSITIVE COCCI FEW GRAM NEGATIVE RODS    Culture   Final    CULTURE REINCUBATED FOR BETTER GROWTH HOLDING FOR POSSIBLE ANAEROBE Performed at [facility], [address], [place], [place] [address]    Report Status PENDING  Incomplete         Radiology Studies: CT Head Wo  Contrast  Result Date: 01/18/2020 CLINICAL DATA:  64 year old male with facial trauma. EXAM: CT HEAD WITHOUT CONTRAST CT MAXILLOFACIAL WITHOUT CONTRAST CT CERVICAL SPINE WITHOUT CONTRAST TECHNIQUE: Multidetector CT imaging of the head, cervical spine, and maxillofacial structures were performed using the standard protocol without intravenous contrast. Multiplanar CT image reconstructions of the cervical spine and maxillofacial structures were also generated. COMPARISON:  Brain MRI dated 06/20/2017. FINDINGS: CT HEAD FINDINGS Brain: The ventricles and sulci appropriate size for patient's age. 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 There is no acute intracranial hemorrhage. No mass effect midline shift no extra-axial fluid collection. Vascular: No hyperdense vessel or unexpected calcification. Skull: Normal.  Negative for fracture or focal lesion. Other: None CT MAXILLOFACIAL FINDINGS Osseous: There is a fracture of the right nasal bone. No other acute fracture. No mandibular subluxation. Orbits: The globes and retro-orbital fat are preserved. Sinuses: Mild mucoperiosteal thickening of paranasal sinuses. No air-fluid level. The mastoid air cells are clear. Soft tissues: Mild soft tissue swelling over the nose. CT CERVICAL SPINE FINDINGS Alignment: No acute subluxation. Skull base and vertebrae: No acute fracture. Soft tissues and spinal canal: No prevertebral fluid or swelling. No visible canal hematoma. Disc levels: Degenerative changes with anterior osteophyte primarily at C3-C4 and C4-C5. Upper chest: Negative. Other: None IMPRESSION: 1. No acute intracranial pathology. 2. No acute/traumatic cervical spine pathology. 3. Right nasal bone fracture. Electronically Signed   By: Elgie CollardArash  Radparvar M.D.   On: 01/18/2020 19:50   CT Cervical Spine Wo Contrast  Result Date: 01/18/2020 CLINICAL DATA:  64 year old male with facial trauma. EXAM: CT HEAD WITHOUT CONTRAST CT  MAXILLOFACIAL WITHOUT CONTRAST CT CERVICAL SPINE WITHOUT CONTRAST TECHNIQUE: Multidetector CT imaging of the head, cervical spine, and maxillofacial structures were performed using the standard protocol without intravenous contrast. Multiplanar CT image reconstructions of the cervical spine and maxillofacial structures were also generated. COMPARISON:  Brain MRI dated 06/20/2017. FINDINGS: CT HEAD FINDINGS Brain: The ventricles and sulci appropriate size for patient's age. 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 There is no acute intracranial hemorrhage. No mass effect midline shift no extra-axial fluid collection. Vascular: No hyperdense vessel or unexpected calcification. Skull: Normal. Negative for fracture or focal lesion. Other: None CT MAXILLOFACIAL FINDINGS Osseous: There is a fracture of the right nasal bone. No other acute fracture. No mandibular subluxation. Orbits: The globes and retro-orbital fat are preserved. Sinuses: Mild mucoperiosteal thickening of paranasal sinuses. No air-fluid level. The mastoid air cells are clear. Soft tissues: Mild soft tissue swelling over the nose. CT CERVICAL SPINE FINDINGS Alignment: No acute subluxation. Skull base and vertebrae: No acute fracture. Soft tissues and spinal canal: No prevertebral fluid or swelling. No visible canal hematoma. Disc levels: Degenerative changes with anterior osteophyte primarily at C3-C4 and C4-C5. Upper chest: Negative. Other: None IMPRESSION: 1. No acute intracranial pathology. 2. No acute/traumatic cervical spine pathology. 3. Right nasal bone fracture. Electronically Signed   By: Elgie CollardArash  Radparvar M.D.   On: 01/18/2020 19:50   DG Finger Little Right  Result Date: 01/18/2020 CLINICAL DATA:  Fall with swelling EXAM: RIGHT LITTLE FINGER 2+V COMPARISON:  None. FINDINGS: Dorsal and radial dislocation of the fifth middle phalanx with respect to the proximal phalanx. Suspected small fracture fragments  adjacent to the head of the proximal phalanx. Vascular calcifications. IMPRESSION: Dorsal and radial dislocation of the fifth middle phalanx with respect to the proximal phalanx with suspected small fracture fragments adjacent to the head of the proximal phalanx Electronically Signed   By: Jasmine PangKim  Fujinaga M.D.   On: 01/18/2020 17:55   DG MINI C-ARM IMAGE ONLY  Result Date: 01/18/2020 There is no interpretation for this exam.  This order is for images obtained during a surgical procedure.  Please See "Surgeries" Tab for more information regarding the procedure.   CT Maxillofacial Wo Contrast  Result Date: 01/18/2020 CLINICAL DATA:  64 year old male with facial trauma. EXAM: CT HEAD WITHOUT CONTRAST CT MAXILLOFACIAL WITHOUT CONTRAST CT CERVICAL SPINE WITHOUT CONTRAST TECHNIQUE: Multidetector CT imaging of the head, cervical spine, and maxillofacial structures were performed using the standard protocol without intravenous contrast. Multiplanar CT image reconstructions of the cervical spine and maxillofacial  structures were also generated. COMPARISON:  Brain MRI dated 06/20/2017. FINDINGS: CT HEAD FINDINGS Brain: The ventricles and sulci appropriate size for patient's age. 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 There is no acute intracranial hemorrhage. No mass effect midline shift no extra-axial fluid collection. Vascular: No hyperdense vessel or unexpected calcification. Skull: Normal. Negative for fracture or focal lesion. Other: None CT MAXILLOFACIAL FINDINGS Osseous: There is a fracture of the right nasal bone. No other acute fracture. No mandibular subluxation. Orbits: The globes and retro-orbital fat are preserved. Sinuses: Mild mucoperiosteal thickening of paranasal sinuses. No air-fluid level. The mastoid air cells are clear. Soft tissues: Mild soft tissue swelling over the nose. CT CERVICAL SPINE FINDINGS Alignment: No acute subluxation. Skull base and vertebrae: No  acute fracture. Soft tissues and spinal canal: No prevertebral fluid or swelling. No visible canal hematoma. Disc levels: Degenerative changes with anterior osteophyte primarily at C3-C4 and C4-C5. Upper chest: Negative. Other: None IMPRESSION: 1. No acute intracranial pathology. 2. No acute/traumatic cervical spine pathology. 3. Right nasal bone fracture. Electronically Signed   By: Elgie Collard M.D.   On: 01/18/2020 19:50        Scheduled Meds:  folic acid  1 mg Oral Daily   insulin aspart  0-15 Units Subcutaneous TID AC & HS   insulin aspart protamine- aspart  12 Units Subcutaneous BID WC   lisinopril  20 mg Oral Daily   multivitamin with minerals  1 tablet Oral Daily   thiamine  100 mg Oral Daily   Or   thiamine  100 mg Intravenous Daily   Continuous Infusions:  ceFEPime (MAXIPIME) IV 2 g (01/20/20 1324)   vancomycin Stopped (01/20/20 1024)     LOS: 1 day    Time spent: 35 minutes.     Alba Cory, MD [facility]   If 7PM-7AM, please contact night-coverage [email or web address]  01/20/2020, 1:29 PM

## 2020-01-21 LAB — BASIC METABOLIC PANEL
Anion gap: 9 (ref 5–15)
BUN: 12 mg/dL (ref 8–23)
CO2: 19 mmol/L — ABNORMAL LOW (ref 22–32)
Calcium: 7.9 mg/dL — ABNORMAL LOW (ref 8.9–10.3)
Chloride: 102 mmol/L (ref 98–111)
Creatinine, Ser: 0.87 mg/dL (ref 0.61–1.24)
GFR, Estimated: >60 mL/min (ref >60)
Glucose, Bld: 275 mg/dL — ABNORMAL HIGH (ref 70–99)
Potassium: 3.8 mmol/L (ref 3.5–5.1)
Sodium: 130 mmol/L — ABNORMAL LOW (ref 135–145)

## 2020-01-21 LAB — GLUCOSE, CAPILLARY
Glucose-Capillary: 137 mg/dL — ABNORMAL HIGH (ref 70–99)
Glucose-Capillary: 229 mg/dL — ABNORMAL HIGH (ref 70–99)
Glucose-Capillary: 277 mg/dL — ABNORMAL HIGH (ref 70–99)
Glucose-Capillary: 312 mg/dL — ABNORMAL HIGH (ref 70–99)

## 2020-01-21 LAB — MAGNESIUM: Magnesium: 1.8 mg/dL (ref 1.7–2.4)

## 2020-01-21 MED ORDER — MAGNESIUM OXIDE 400 (241.3 MG) MG PO TABS
200.0000 mg | ORAL_TABLET | Freq: Two times a day (BID) | ORAL | Status: DC
  Administered 2020-01-21 – 2020-01-22 (×3): 200 mg via ORAL
  Filled 2020-01-21 (×3): qty 1

## 2020-01-21 MED ORDER — SODIUM CHLORIDE 0.9 % IV SOLN: INTRAVENOUS | Status: DC

## 2020-01-21 MED ORDER — METRONIDAZOLE 500 MG PO TABS
500.0000 mg | ORAL_TABLET | Freq: Three times a day (TID) | ORAL | Status: DC
  Administered 2020-01-21 – 2020-01-22 (×4): 500 mg via ORAL
  Filled 2020-01-21 (×4): qty 1

## 2020-01-21 NOTE — TOC CAGE-AID Note (Signed)
Transition of Care [facility]) - CAGE-AID Screening   Patient Details  Name: Ryan Mcbride MRN: [id number] Date of Birth: [birth year]  [facility]) CM/SW Contact:    Emeterio Reeve, [place] Phone Number: 01/21/2020, 4:55 PM   Clinical Narrative:  CSW met with pt at bedside. CSW introduced self and explained role at the hospital.  Pt reports occasional alcohol usee. Pt denies substance use. Pt did not need any resources at this time.    CAGE-AID Screening:    Have You Ever Felt You Ought to Cut Down on Your Drinking or Drug Use?: No Have People Annoyed You By Critizing Your Drinking Or Drug Use?: No Have You Felt Bad Or Guilty About Your Drinking Or Drug Use?: No Have You Ever Had a Drink or Used Drugs First Thing In The Morning to Steady Your Nerves or to Get Rid of a Hangover?: No CAGE-AID Score: 0  Substance Abuse Education Offered: Yes  Substance abuse interventions: Patient Counseling, Educational Materials    Emeterio Reeve, Latanya Presser, [place] Social Worker [phone]

## 2020-01-21 NOTE — Progress Notes (Signed)
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  Patient reported that he fell at home and injure his right hand, lacerating the fifth digit.  Evaluation in the emergency department patient was found to have an open dislocation of the joint of the fifth digit with concern for surrounding soft tissue infection.  X-ray revealed dorsal and radial dislocation of the fifth middle phalanx with respect to the proximal phalanx suspect a small fracture fragment noted adjacent to the head of the proximal phalanx.   Patient was taken to the OR for reduction of the PIP joint, treatment of the associated fracture as well as irrigation and debridement of the area by Dr. Roney Mans.    Assessment & Plan:   Principal Problem:   Cellulitis of finger of right hand Active Problems:   Closed fracture dislocation of proximal interphalangeal (PIP) joint of finger   Uncontrolled type 2 diabetes mellitus with hyperglycemia, without long-term current use of insulin (HCC)   Essential hypertension   Alcohol abuse   1-Open right small finger PIP dislocation with volar fracture of the middle phalanx and secondary infection: -S/p irrigation and debridement of open right small finger PIP joint fracture dislocation, irrigation debridement of right a small finger flexor tendon sheath, open reduction of right small finger PIP joint, open treatment of right small finger middle phalanx volar  fracture without internal fixation by Dr. Roney Mans on 01/18/2020 -Continue IV antibiotics,  Vancomycin, add Flagyl. -Wound culture growing: Staph aureus, group A strep, few  Prevotella Melaninogenica.  -he will need close follow up with Dr Roney Mans for hand wound. There is concern with viability for tip of 5th finger.   2-Diabetes type 2, Uncontrolled, Hyperglycemia;  -HbA1c at 11.  -Patient currently does not take any medication for his diabetes as an outpatient. -Started on Lantus, sliding scale insulin, meal coverage continued.  change to 70/30, patient might be able to afford  this insulin.  CBG better controlled.  -Hold sliding scale insulin to avoid hypoglycemia  3-Hypertension: Patient was a started on lisinopril. Increase dose.  Better controlled  4-Alcohol abuse: Continue with CIWA protocol. Continue folic acid and thiamine.  5-Hypomagnesemia;  Replaced. Repeat labs tomorrow.   6-Hyponatremia;  Continue with IV fluids.      Estimated body mass index is 24.96 kg/m as calculated from the following:   Height as of this encounter: 5\' 5"  (1.651 m).   Weight as of this encounter: 68 kg.   DVT prophylaxis: SCDs, start Lovenox when okay by hand surgeons Code Status: Full code Family Communication: daughter updated Disposition Plan:  Status is: Observation  The patient remains OBS appropriate and will d/c before 2 midnights.  Dispo: The patient is from: Home              Anticipated d/c is to: Home              Anticipated d/c date is: 1 day              Patient currently is not medically stable to d/c.  waiting cultures result to tailor antibiotics.        Consultants:   Dr  Creighton Engineer, site)  Procedures:  -S/p irrigation and debridement of open right small finger PIP joint fracture dislocation, irrigation debridement of right a small finger flexor tendon sheath, open reduction of right small finger PIP joint, open treatment of right small finger middle phalanx volar  fracture without internal fixation by Dr. Roney Mans on 01/18/2020    Antimicrobials:  Vancomycin 11/26 Cefepime 11/26  Subjective: Report less pain  hand.    Objective: Vitals:   01/20/20 1446 01/20/20 2000 01/21/20 0530 01/21/20 0808  BP: 125/73 130/80 134/84 139/89  Pulse: 81 72 72 80  Resp: 16 17 16 18   Temp: 97.7 F (36.5 C) 98.5 F (36.9 C) 99.3 F (37.4 C) 98.6 F (37 C)  TempSrc: Oral Oral Oral Oral  SpO2: 93% 100% 98% 98%  Weight:      Height:        Intake/Output Summary (Last 24 hours) at 01/21/2020 1336 Last data filed at 01/21/2020 1035 Gross per 24 hour  Intake 960 ml  Output 350 ml  Net 610 ml   Filed Weights   01/18/20 1654  Weight: 68 kg    Examination:  General exam:NAD Respiratory system: CTA Cardiovascular system: S 1, S 2 RRR Gastrointestinal system: BS present. Soft, nt Central nervous system: Alert Extremities: right hand with dressing, little finger dusky color.    Data Reviewed: I have personally reviewed following labs and imaging studies  CBC: Recent Labs  Lab 01/18/20 1835 01/19/20 0157  WBC 7.7 7.6  NEUTROABS 5.8 5.4  HGB 14.7 13.0  HCT 42.8 36.9*  MCV 89.9 87.4  PLT 210 196   Basic Metabolic Panel: Recent Labs  Lab 01/18/20 1835 01/19/20 0157 01/21/20 0904  NA 132* 132* 130*  K 3.8 3.9 3.8  CL 97* 99 102  CO2 23 20* 19*  GLUCOSE 377* 294* 275*  BUN 13 12 12   CREATININE 1.01 0.89 0.87  CALCIUM 9.0 8.3* 7.9*  MG  --  1.6* 1.8  PHOS  --  2.6  --    GFR: Estimated Creatinine Clearance: 74.6 mL/min (by C-G formula based on SCr of 0.87 mg/dL). Liver Function Tests: Recent Labs  Lab 01/18/20 1835 01/19/20 0157  AST 21 18  ALT 21 18  ALKPHOS 97 84  BILITOT 1.0 0.9  PROT 7.1 6.1*  ALBUMIN 3.6 3.1*   No results for input(s): LIPASE, AMYLASE in the last 168 hours. No results for input(s): AMMONIA in the last 168 hours. Coagulation Profile: No results for input(s): INR, PROTIME in the last 168 hours. Cardiac Enzymes: No results for input(s): CKTOTAL, CKMB, CKMBINDEX, TROPONINI in the last 168 hours. BNP (last 3 results) No results for input(s): PROBNP  in the last 8760 hours. HbA1C: Recent Labs    01/19/20 0157  HGBA1C 11.8*   CBG: Recent Labs  Lab 01/20/20 1109 01/20/20 1627 01/20/20 2113 01/21/20 0610 01/21/20 1149  GLUCAP 158* 251* 158* 137* 229*   Lipid Profile: No results for input(s): CHOL, HDL, LDLCALC, TRIG, CHOLHDL, LDLDIRECT in the last 72 hours. Thyroid Function Tests: No results for input(s): TSH, T4TOTAL, FREET4, T3FREE, THYROIDAB in the last 72 hours. Anemia Panel: No results for input(s): VITAMINB12, FOLATE, FERRITIN, TIBC, IRON, RETICCTPCT in the last 72 hours. Sepsis Labs: No results for input(s): PROCALCITON, LATICACIDVEN in the last 168 hours.  Recent Results (from the past 240 hour(s))  Resp Panel by RT-PCR (Flu A&B, Covid) Nasopharyngeal Swab     Status: None   Collection Time: 01/18/20  6:55 PM  Specimen: Nasopharyngeal Swab; Nasopharyngeal(NP) swabs in vial transport medium  Result Value Ref Range Status   SARS Coronavirus 2 by RT PCR NEGATIVE NEGATIVE Final    Comment: (NOTE) SARS-CoV-2 target nucleic acids are NOT DETECTED.  The SARS-CoV-2 RNA is generally detectable in upper respiratory specimens during the acute phase of infection. The lowest concentration of SARS-CoV-2 viral copies this assay can detect is 138 copies/mL. A negative result does not preclude SARS-Cov-2 infection and should not be used as the sole basis for treatment or other patient management decisions. A negative result may occur with  improper specimen collection/handling, submission of specimen other than nasopharyngeal swab, presence of viral mutation(s) within the areas targeted by this assay, and inadequate number of viral copies(<138 copies/mL). A negative result must be combined with clinical observations, patient history, and epidemiological information. The expected result is Negative.  Fact Sheet for Patients:  BloggerCourse.com  Fact Sheet for Healthcare Providers:   SeriousBroker.it  This test is no t yet approved or cleared by the [place] FDA and  has been authorized for detection and/or diagnosis of SARS-CoV-2 by FDA under an Emergency Use Authorization (EUA). This EUA will remain  in effect (meaning this test can be used) for the duration of the COVID-19 declaration under Section 564(b)(1) of the Act, 21 U.S.C.section 360bbb-3(b)(1), unless the authorization is terminated  or revoked sooner.       Influenza A by PCR NEGATIVE NEGATIVE Final   Influenza B by PCR NEGATIVE NEGATIVE Final    Comment: (NOTE) The Xpert Xpress SARS-CoV-2/FLU/RSV plus assay is intended as an aid in the diagnosis of influenza from Nasopharyngeal swab specimens and should not be used as a sole basis for treatment. Nasal washings and aspirates are unacceptable for Xpert Xpress SARS-CoV-2/FLU/RSV testing.  Fact Sheet for Patients: BloggerCourse.com  Fact Sheet for Healthcare Providers: SeriousBroker.it  This test is not yet approved or cleared by the [place] FDA and has been authorized for detection and/or diagnosis of SARS-CoV-2 by FDA under an Emergency Use Authorization (EUA). This EUA will remain in effect (meaning this test can be used) for the duration of the COVID-19 declaration under Section 564(b)(1) of the Act, 21 U.S.C. section 360bbb-3(b)(1), unless the authorization is terminated or revoked.  Performed at [facility], [address] [place], [place] [address]   Aerobic/Anaerobic Culture (surgical/deep wound)     Status: None (Preliminary result)   Collection Time: 01/18/20  9:40 PM   Specimen: Wound  Result Value Ref Range Status   Specimen Description WOUND  Final   Special Requests RIGHT SMALL FINGER  Final   Gram Stain   Final    FEW WBC PRESENT,BOTH PMN AND MONONUCLEAR ABUNDANT GRAM POSITIVE COCCI FEW GRAM NEGATIVE RODS    Culture   Final     ABUNDANT STAPHYLOCOCCUS AUREUS ABUNDANT GROUP B STREP(S.AGALACTIAE)ISOLATED TESTING AGAINST S. AGALACTIAE NOT ROUTINELY PERFORMED DUE TO PREDICTABILITY OF AMP/PEN/VAN SUSCEPTIBILITY. FEW PREVOTELLA MELANINOGENICA BETA LACTAMASE POSITIVE Performed at [facility], [address] [place], [place] [address]    Report Status PENDING  Incomplete         Radiology Studies: No results found.      Scheduled Meds: . folic acid  1 mg Oral Daily  . insulin aspart protamine- aspart  12 Units Subcutaneous BID WC  . lisinopril  20 mg Oral Daily  . metroNIDAZOLE  500 mg Oral Q8H  . multivitamin with minerals  1 tablet Oral Daily  . thiamine  100  mg Oral Daily   Or  . thiamine  100 mg Intravenous Daily   Continuous Infusions: . vancomycin Stopped (01/21/20 1006)     LOS: 2 days    Time spent: 35 minutes.     Alba CoryBelkys A Karmine Kauer, MD [facility]   If 7PM-7AM, please contact night-coverage [email or web address]  01/21/2020, 1:36 PM

## 2020-01-21 NOTE — Progress Notes (Signed)
   Ortho Hand Progress Note  Subjective: No acute events last night.  Minimal pain in right hand   Objective: Vital signs in last 24 hours: Temp:  [97.7 F (36.5 C)-99.3 F (37.4 C)] 98.6 F (37 C) (11/29 0808) Pulse Rate:  [72-81] 80 (11/29 0808) Resp:  [16-18] 18 (11/29 0808) BP: (125-139)/(73-89) 139/89 (11/29 0808) SpO2:  [93 %-100 %] 98 % (11/29 0808)  Intake/Output from previous day: 11/28 0701 - 11/29 0700 In: 990 [P.O.:240; IV Piggyback:750] Out: 740 [Urine:740] Intake/Output this shift: No intake/output data recorded.  Recent Labs    01/18/20 1835 01/19/20 0157  HGB 14.7 13.0   Recent Labs    01/18/20 1835 01/19/20 0157  WBC 7.7 7.6  RBC 4.76 4.22  HCT 42.8 36.9*  PLT 210 196   Recent Labs    01/18/20 1835 01/19/20 0157  NA 132* 132*  K 3.8 3.9  CL 97* 99  CO2 23 20*  BUN 13 12  CREATININE 1.01 0.89  GLUCOSE 377* 294*  CALCIUM 9.0 8.3*   No results for input(s): LABPT, INR in the last 72 hours.  Aaox3 nad Resp nonlabored RRR RUE: Dressings changes. Volar laceration/incision with decreased erythema to the digit. No active drainage. Intact sensation to both R/U aspects of the small finger. Fingertip with persistent dusky appearance and sloughing of skin.    Assessment/Plan: Right small finger open PIP fracture dislocation with secondary infection status post irrigation debridement and open reduction.  Date of surgery 01/18/2020  -Dressings changed. -Continue with dorsal blocking splint -CXs with GPCs and GNRs -abx per primary. -There is concern for the viability of the finger tip as his exam still shows dusky appearance. This is likely from his infection, injury and surgery, all in a patient with poor vascular status as evidence from his previous BKA. Will continue monitor clinically but I did discuss with the patient today that he may require a revision amputation in the future.   OK to d/c from hand standpoint and follow up in 5-7  days   Cain Saupe III 01/21/2020, 9:01 AM  [phone]

## 2020-01-21 NOTE — Progress Notes (Signed)
Inpatient Diabetes Program Recommendations  AACE/ADA: New Consensus Statement on Inpatient Glycemic Control (2015)  Target Ranges:  Prepandial:   less than 140 mg/dL      Peak postprandial:   less than 180 mg/dL (1-2 hours)      Critically ill patients:  140 - 180 mg/dL   Lab Results  Component Value Date   GLUCAP 229 (H) 01/21/2020   HGBA1C 11.8 (H) 01/19/2020    Review of Glycemic Control Results for Ryan Mcbride, Ryan Mcbride (MRN [id number]) as of 01/21/2020 14:31  Ref. Range 01/20/2020 11:09 01/20/2020 16:27 01/20/2020 21:13 01/21/2020 06:10 01/21/2020 11:49  Glucose-Capillary Latest Ref Range: 70 - 99 mg/dL 881 (H) 103 (H) 159 (H) 137 (H) 229 (H)   Diabetes history: Dm 2 Outpatient Diabetes medications: None- patient has not taken any medications in 5-6 months for DM Current orders for Inpatient glycemic control:  Novolog 70/30 mix 12 units bid Inpatient Diabetes Program Recommendations:    Spoke briefly with patient regarding elevated A1C.  He states he has not had any medications for DM in several months.  He does state he has taken insulin in the past and bought it from [facility].  We briefly discussed importance of taking insulin with food.  He states that he has never had a low blood sugar in the past.  Will need to follow-up prior to d/c.  Asked RN to allow patient to practice self-administration of insulin while in the hospital.  Explained to patient that he will need insulin at discharge and will need to get it at [facility].  Also explained importance of good glucose control for healing of hand.  -At d/c will need to order Reli-on 70/30 (#[id number]) insulin and syringes.   Thanks,  Beryl Meager, RN, BC-ADM Inpatient Diabetes Coordinator Pager [phone] (8a-5p)

## 2020-01-21 NOTE — Plan of Care (Signed)
  Problem: Education: Goal: Verbalization of understanding the information provided will improve Outcome: Progressing   Problem: Coping: Goal: Level of anxiety will decrease Outcome: Progressing   Problem: Physical Regulation: Goal: Postoperative complications will be avoided or minimized Outcome: Progressing   Problem: Pain Management: Goal: Pain level will decrease Outcome: Progressing   Problem: Skin Integrity: Goal: Signs of wound healing will improve Outcome: Progressing   Problem: Tissue Perfusion: Goal: Ability to maintain adequate tissue perfusion will improve Outcome: Progressing

## 2020-01-21 NOTE — Plan of Care (Signed)
  Problem: Activity: Goal: Ability to tolerate increased activity will improve Outcome: Progressing   Problem: Education: Goal: Verbalization of understanding the information provided will improve Outcome: Progressing   Problem: Pain Management: Goal: Pain level will decrease Outcome: Progressing   Problem: Skin Integrity: Goal: Signs of wound healing will improve Outcome: Progressing

## 2020-01-22 ENCOUNTER — Other Ambulatory Visit (HOSPITAL_COMMUNITY): Payer: Self-pay | Admitting: Internal Medicine

## 2020-01-22 LAB — AEROBIC/ANAEROBIC CULTURE W GRAM STAIN (SURGICAL/DEEP WOUND)

## 2020-01-22 LAB — GLUCOSE, CAPILLARY
Glucose-Capillary: 158 mg/dL — ABNORMAL HIGH (ref 70–99)
Glucose-Capillary: 235 mg/dL — ABNORMAL HIGH (ref 70–99)

## 2020-01-22 MED ORDER — "INSULIN SYRINGE 27G X 1/2"" 1 ML MISC": 1.0000 "application " | Freq: Two times a day (BID) | 3 refills | Status: AC

## 2020-01-22 MED ORDER — INSULIN ASPART PROT & ASPART (70-30 MIX) 100 UNIT/ML ~~LOC~~ SUSP
14.0000 [IU] | Freq: Two times a day (BID) | SUBCUTANEOUS | Status: DC
  Administered 2020-01-22: 14 [IU] via SUBCUTANEOUS
  Filled 2020-01-22: qty 10

## 2020-01-22 MED ORDER — AMOXICILLIN-POT CLAVULANATE 875-125 MG PO TABS
1.0000 | ORAL_TABLET | Freq: Two times a day (BID) | ORAL | Status: DC
  Administered 2020-01-22: 1 via ORAL
  Filled 2020-01-22: qty 1

## 2020-01-22 MED ORDER — AMOXICILLIN-POT CLAVULANATE 875-125 MG PO TABS: 1.0000 | ORAL_TABLET | Freq: Two times a day (BID) | ORAL | 0 refills | Status: DC

## 2020-01-22 MED ORDER — INSULIN ASPART PROT & ASPART (70-30 MIX) 100 UNIT/ML ~~LOC~~ SUSP
14.0000 [IU] | Freq: Two times a day (BID) | SUBCUTANEOUS | 11 refills | Status: DC
Start: 2020-01-22 — End: 2020-01-22

## 2020-01-22 MED ORDER — ADULT MULTIVITAMIN W/MINERALS CH: 1.0000 | ORAL_TABLET | Freq: Every day | ORAL | Status: AC

## 2020-01-22 MED ORDER — POLYETHYLENE GLYCOL 3350 17 G PO PACK: 17.0000 g | PACK | Freq: Every day | ORAL | 0 refills | Status: AC | PRN

## 2020-01-22 MED ORDER — POLYETHYLENE GLYCOL 3350 17 G PO PACK: 17.0000 g | PACK | Freq: Every day | ORAL | 0 refills | Status: DC | PRN

## 2020-01-22 MED ORDER — FOLIC ACID 1 MG PO TABS
1.0000 mg | ORAL_TABLET | Freq: Every day | ORAL | 1 refills | Status: DC
Start: 2020-01-22 — End: 2020-01-22

## 2020-01-22 MED ORDER — THIAMINE HCL 100 MG PO TABS
100.0000 mg | ORAL_TABLET | Freq: Every day | ORAL | 0 refills | Status: DC
Start: 2020-01-23 — End: 2020-01-23

## 2020-01-22 MED ORDER — OXYCODONE-ACETAMINOPHEN 5-325 MG PO TABS: 1.0000 | ORAL_TABLET | ORAL | 0 refills | Status: DC | PRN

## 2020-01-22 MED ORDER — INSULIN ASPART PROT & ASPART (70-30 MIX) 100 UNIT/ML ~~LOC~~ SUSP: 14.0000 [IU] | Freq: Two times a day (BID) | SUBCUTANEOUS | Status: DC

## 2020-01-22 MED ORDER — METFORMIN HCL 1000 MG PO TABS
1000.0000 mg | ORAL_TABLET | Freq: Two times a day (BID) | ORAL | 3 refills | Status: DC
Start: 2020-01-22 — End: 2020-03-24

## 2020-01-22 MED ORDER — LISINOPRIL 20 MG PO TABS
20.0000 mg | ORAL_TABLET | Freq: Every day | ORAL | 3 refills | Status: DC
Start: 2020-01-22 — End: 2020-02-26

## 2020-01-22 MED ORDER — OXYCODONE-ACETAMINOPHEN 5-325 MG PO TABS: 1.0000 | ORAL_TABLET | Freq: Four times a day (QID) | ORAL | 0 refills | Status: DC | PRN

## 2020-01-22 MED ORDER — "INSULIN SYRINGE 27G X 1/2"" 1 ML MISC": 1.0000 "application " | Freq: Two times a day (BID) | 3 refills | Status: DC

## 2020-01-22 MED ORDER — THIAMINE HCL 100 MG PO TABS
100.0000 mg | ORAL_TABLET | Freq: Every day | ORAL | 0 refills | Status: DC
Start: 2020-01-23 — End: 2020-01-22

## 2020-01-22 MED FILL — POLYETHYLENE GLYCOL 3350 PO: 17 | 14 days supply | Qty: 238 | Fill #0

## 2020-01-22 MED FILL — LISINOPRIL 20 MG TABLET: 20 | 30 days supply | Qty: 30 | Fill #0

## 2020-01-22 MED FILL — METFORMIN HCL 1000 MG TABS: 1000 | 30 days supply | Qty: 60 | Fill #0

## 2020-01-22 MED FILL — ULTICARE INS 0.5 ML 30GX1/2: 30G X 1/2" | 30 days supply | Qty: 60 | Fill #0

## 2020-01-22 MED FILL — NOVOLOG MIX 70/30 VIAL: (70-30) 100 | 30 days supply | Qty: 10 | Fill #0

## 2020-01-22 MED FILL — FOLIC ACID 1 MG TABS: 1 | 30 days supply | Qty: 30 | Fill #0

## 2020-01-22 MED FILL — AMOX-CLAV 875-125 MG TABLET: 875-125 | 10 days supply | Qty: 20 | Fill #0

## 2020-01-22 MED FILL — OXYCODONE-APAP 5-325MG: 5-325 | 5 days supply | Qty: 20 | Fill #0

## 2020-01-22 MED FILL — VITAMIN B-1 100 MG TABS: 100 | 30 days supply | Qty: 30 | Fill #0

## 2020-01-22 NOTE — TOC Initial Note (Addendum)
Transition of Care [facility]) - Initial/Assessment Note    Patient Details  Name: Ryan Mcbride MRN: [id number] Date of Birth: [birth year]  Transition of Care [facility]) CM/SW Contact:    Curlene Labrum, RN Phone Number: 01/22/2020, 11:21 AM  Clinical Narrative:                 Case management met with the patient at the bedside regarding transitions of care to home.  The patient states that he lives with his daughter at the listed address on the face-sheet.  The patient states that he lost his blood sugar machine and does not have one at home.  I called the diabetes coordinator and they are delivering a glucometer and supplies to the patient's room today.  The patient will need his discharge medications filled through the [facility] since he does not have cash for medications for discharge.  The patient states that he does not have a primary care physician and the patient was set up with a PCP for hospital follow up and it is noted in the discharge instructions.  The patient plans to have his daughter drive him home upon discharge.  TOC will follow up regarding glucometer machine for the patient.  Expected Discharge Plan: Home/Self Care Barriers to Discharge: No Barriers Identified   Patient Goals and CMS Choice Patient states their goals for this hospitalization and ongoing recovery are:: Patient plans to discharge home with daughter. CMS Medicare.gov Compare Post Acute Care list provided to:: Patient Choice offered to / list presented to : Patient  Expected Discharge Plan and Services Expected Discharge Plan: Home/Self Care   Discharge Planning Services: CM Consult, [place] Program, Medication Assistance, Follow-up appt scheduled   Living arrangements for the past 2 months: Apartment                 DME Arranged: Glucometer                    Prior Living Arrangements/Services Living arrangements for the past 2 months: Apartment Lives with:: Adult Children Patient  language and need for interpreter reviewed:: Yes Do you feel safe going back to the place where you live?: Yes      Need for Family Participation in Patient Care: Yes (Comment) Care giver support system in place?: Yes (comment)   Criminal Activity/Legal Involvement Pertinent to Current Situation/Hospitalization: No - Comment as needed  Activities of Daily Living Home Assistive Devices/Equipment: Prosthesis ADL Screening (condition at time of admission) Patient's cognitive ability adequate to safely complete daily activities?: Yes Is the patient deaf or have difficulty hearing?: No Does the patient have difficulty seeing, even when wearing glasses/contacts?: No Does the patient have difficulty concentrating, remembering, or making decisions?: No Patient able to express need for assistance with ADLs?: Yes Does the patient have difficulty dressing or bathing?: No Independently performs ADLs?: Yes (appropriate for developmental age) Does the patient have difficulty walking or climbing stairs?: Yes Weakness of Legs: Left Weakness of Arms/Hands: None  Permission Sought/Granted Permission sought to share information with : Case Manager Permission granted to share information with : Yes, Verbal Permission Granted        Permission granted to share info w Relationship: daughter, [name]     Emotional Assessment Appearance:: Appears stated age Attitude/Demeanor/Rapport: Engaged Affect (typically observed): Accepting Orientation: : Oriented to Self, Oriented to Place, Oriented to  Time, Oriented to Situation Alcohol / Substance Use: Not Applicable Psych Involvement: No (comment)  Admission diagnosis:  Hyperglycemia [R73.9]  Finger infection [L08.9] Closed fracture of nasal bone, initial encounter [S02.2XXA] Displaced fracture of proximal phalanx of right little finger, initial encounter for closed fracture [S62.616A] Cellulitis of finger of right hand [L03.011] Patient Active Problem List    Diagnosis Date Noted  . Cellulitis of finger of right hand 01/18/2020  . Closed fracture dislocation of proximal interphalangeal (PIP) joint of finger 01/18/2020  . Uncontrolled type 2 diabetes mellitus with hyperglycemia, without long-term current use of insulin ([place]) 01/18/2020  . Essential hypertension 01/18/2020  . Alcohol abuse 01/18/2020  . Seizure [facility])    PCP:  Patient, [name] Pharmacy:   [facility] [address], [place] [address] [place] [address] Phone: [phone] Fax: [place], [place] - [place] [address] [place] [address] Phone: [phone] Fax: [place], [place] [address] [place] [address] Phone: [phone] Fax: [phone]     Social Determinants of Health (SDOH) Interventions    Readmission Risk Interventions Readmission Risk Prevention Plan 01/22/2020  Post Dischage Appt Complete  Medication Screening Complete  Transportation Screening Complete  Some recent data might be hidden

## 2020-01-22 NOTE — Progress Notes (Signed)
Inpatient Diabetes Program Recommendations  AACE/ADA: New Consensus Statement on Inpatient Glycemic Control (2015)  Target Ranges:  Prepandial:   less than 140 mg/dL      Peak postprandial:   less than 180 mg/dL (1-2 hours)      Critically ill patients:  140 - 180 mg/dL   Lab Results  Component Value Date   GLUCAP 235 (H) 01/22/2020   HGBA1C 11.8 (H) 01/19/2020   Provided patient with a glucometer and supplies.  He understands how to use it and is aware to check his blood sugar 3 x a day.  He is aware how to administer his insulin and does not have any questions.  Might have room to increase 70/30 more as post prandials are elevated and renal function is wnl.  Will follow tomorrow.  Noted 70/30 was increased today to 14 units bid from 12 units bid.  Will continue to follow while inpatient.  Thank you, Dulce Sellar, RN, BSN Diabetes Coordinator Inpatient Diabetes Program [phone] (team pager from 8a-5p)

## 2020-01-22 NOTE — Progress Notes (Signed)
Inpatient Diabetes Program Recommendations  AACE/ADA: New Consensus Statement on Inpatient Glycemic Control (2015)  Target Ranges:  Prepandial:   less than 140 mg/dL      Peak postprandial:   less than 180 mg/dL (1-2 hours)      Critically ill patients:  140 - 180 mg/dL   Lab Results  Component Value Date   GLUCAP 158 (H) 01/22/2020   HGBA1C 11.8 (H) 01/19/2020    Review of Glycemic Control Results for Ryan Mcbride, Ryan Mcbride (MRN [id number]) as of 01/22/2020 10:36  Ref. Range 01/21/2020 06:10 01/21/2020 11:49 01/21/2020 18:14 01/21/2020 20:53 01/22/2020 06:41  Glucose-Capillary Latest Ref Range: 70 - 99 mg/dL 703 (H) 500 (H) 938 (H) 312 (H) 158 (H)    Inpatient Diabetes Program Recommendations:     Novolog 0-9 units tid with meals  Will continue to follow while inpatient.  Thank you, Dulce Sellar, RN, BSN Diabetes Coordinator Inpatient Diabetes Program [phone] (team pager from 8a-5p)

## 2020-01-22 NOTE — Plan of Care (Signed)
  Problem: Activity: Goal: Ability to tolerate increased activity will improve Outcome: Progressing   Problem: Coping: Goal: Level of anxiety will decrease Outcome: Progressing   Problem: Pain Management: Goal: Pain level will decrease Outcome: Progressing

## 2020-01-22 NOTE — Progress Notes (Signed)
Discharge package printed and instructions given to daughter, Felizardo Hoffmann. Daughter verbalizes understanding.

## 2020-01-22 NOTE — Discharge Summary (Signed)
Physician Discharge Summary  Jayson Waterhouse JSE:[id number] DOB: [birth year] DOA: 01/18/2020  PCP: Patient, [name]  Admit date: 01/18/2020 Discharge date: 01/22/2020  Admitted From: Home  Disposition: Home  Recommendations for Outpatient Follow-up:  1. Follow up with PCP in 1-2 weeks 2. Please obtain BMP/CBC in one week 3. Needs to follow up with hand sx for further care post fracture and I and D  4. Needs to follow up with PCP for management of DM and HTN.   Home Health: none  Discharge Condition: Stable.  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  Patient reported that he fell at home and injure his right hand, lacerating the fifth digit.  Evaluation in the emergency department patient was found to have an open dislocation of the joint of the fifth digit with concern for surrounding soft tissue infection.  X-ray revealed dorsal and radial dislocation of the fifth middle phalanx with respect to the proximal phalanx suspect a small fracture fragment noted adjacent to the head of the proximal phalanx.   Patient was taken to the OR for reduction of the PIP joint, treatment of the associated fracture as well as irrigation and debridement of the area by Dr. Roney Mans.   1-Open right small finger PIP dislocation with volar fracture of the middle phalanx and secondary infection: -S/p irrigation and debridement of open right small finger PIP joint fracture dislocation, irrigation debridement of right a small finger flexor tendon sheath, open reduction of right small finger PIP joint, open treatment of right small finger middle phalanx volar  fracture without  internal fixation by Dr. Roney Mans on 01/18/2020 -Continue IV antibiotics,  Vancomycin, add Flagyl. -Wound culture growing: Staph aureus, group A strep, few Prevotella Melaninogenica.  -he will need close follow up with Dr Roney Mans for hand wound. There is concern with viability for tip of 5th finger.  -Plan to discharge him on Augmentin to cover all three pathogen for 10 days/.Marland Kitchen  2-Diabetes type 2, Uncontrolled, Hyperglycemia;  -HbA1c at 11.  -Patient currently does not take any medication for his diabetes as an outpatient. -Started on Lantus, sliding scale insulin, meal coverage continued.  change to 70/30, patient might be able to afford  this insulin.  CBG better controlled.  -Hold sliding scale insulin to avoid hypoglycemia -Resume metformin at discharge.   3-Hypertension: Patient was a started on lisinopril. Increase dose.  Better controlled  4-Alcohol abuse: Continue with CIWA protocol. Continue folic acid and thiamine.  5-Hypomagnesemia;  Replaced. Repeat labs tomorrow.   6-Hyponatremia;  Continue with IV fluids.    Discharge Diagnoses:  Principal Problem:   Cellulitis of finger of right hand Active Problems:   Closed fracture dislocation of proximal interphalangeal (PIP) joint of finger   Uncontrolled type 2 diabetes mellitus with hyperglycemia, without long-term current use of insulin (HCC)   Essential hypertension   Alcohol abuse    Discharge Instructions  Discharge Instructions    Diet - low sodium heart healthy   Complete by: As directed    Discharge wound care:   Complete by: As directed    Increase activity slowly   Complete by: As directed      Allergies as of 01/22/2020  No Known Allergies     Medication List    STOP taking these medications   glimepiride 1 MG tablet Commonly known as: AMARYL     TAKE these medications   amoxicillin-clavulanate 875-125 MG tablet Commonly known as: AUGMENTIN Take 1 tablet by mouth every 12  (twelve) hours for 10 days.   folic acid 1 MG tablet Commonly known as: FOLVITE Take 1 tablet (1 mg total) by mouth daily.   insulin aspart protamine- aspart (70-30) 100 UNIT/ML injection Commonly known as: NOVOLOG MIX 70/30 Inject 0.14 mLs (14 Units total) into the skin 2 (two) times daily with a meal.   Insulin Syringe 27G X 1/2" 1 ML Misc 1 application by Does not apply route 2 (two) times daily.   lisinopril 20 MG tablet Commonly known as: ZESTRIL Take 1 tablet (20 mg total) by mouth daily.   metFORMIN 1000 MG tablet Commonly known as: GLUCOPHAGE Take 1 tablet (1,000 mg total) by mouth 2 (two) times daily with a meal.   multivitamin with minerals Tabs tablet Take 1 tablet by mouth daily.   oxyCODONE-acetaminophen 5-325 MG tablet Commonly known as: PERCOCET/ROXICET Take 1 tablet by mouth every 6 (six) hours as needed for up to 5 days for moderate pain.   polyethylene glycol 17 g packet Commonly known as: MIRALAX / GLYCOLAX Take 17 g by mouth daily as needed for mild constipation.   thiamine 100 MG tablet Take 1 tablet (100 mg total) by mouth daily. Start taking on: January 23, 2020            Discharge Care Instructions  (From admission, onward)         Start     Ordered   01/22/20 0000  Discharge wound care:        01/22/20 1317          Follow-up Information    Cain Saupe III, MD. Schedule an appointment as soon as possible for a visit in 5 day(s).   Why: Call to schedule a follow up appointment for a wound check in 5-7 days Contact information: [address] [place] [address] [phone]        [facility]. Go on 02/04/2020.   Specialty: Family Medicine Why: You have a primary care hospital follow up appointment with the clinic on Monday February 04, 2020 at 2:30 pm. Contact information: [place] [address] [phone]       Glucometer Follow up.    Why: A glucometer will be supplied to you before you are discharged home.             No Known Allergies  Consultations:  Dr Roney Mans    Procedures/Studies: CT Head Wo Contrast  Result Date: 01/18/2020 CLINICAL DATA:  64 year old male with facial trauma. EXAM: CT HEAD WITHOUT CONTRAST CT MAXILLOFACIAL WITHOUT CONTRAST CT CERVICAL SPINE WITHOUT CONTRAST TECHNIQUE: Multidetector CT imaging of the head, cervical spine, and maxillofacial structures were performed using the standard protocol without intravenous contrast. Multiplanar CT image reconstructions of the cervical spine and maxillofacial structures were also generated. COMPARISON:  Brain MRI dated 06/20/2017. FINDINGS: CT HEAD FINDINGS Brain: The ventricles and sulci appropriate size for patient's age. 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 There is no acute intracranial hemorrhage. No mass effect midline shift no extra-axial fluid collection. Vascular: No hyperdense vessel or unexpected calcification. Skull: Normal. Negative for fracture or focal lesion. Other: None CT MAXILLOFACIAL FINDINGS  Osseous: There is a fracture of the right nasal bone. No other acute fracture. No mandibular subluxation. Orbits: The globes and retro-orbital fat are preserved. Sinuses: Mild mucoperiosteal thickening of paranasal sinuses. No air-fluid level. The mastoid air cells are clear. Soft tissues: Mild soft tissue swelling over the nose. CT CERVICAL SPINE FINDINGS Alignment: No acute subluxation. Skull base and vertebrae: No acute fracture. Soft tissues and spinal canal: No prevertebral fluid or swelling. No visible canal hematoma. Disc levels: Degenerative changes with anterior osteophyte primarily at C3-C4 and C4-C5. Upper chest: Negative. Other: None IMPRESSION: 1. No acute intracranial pathology. 2. No acute/traumatic cervical spine pathology. 3. Right nasal bone fracture. Electronically Signed   By: Elgie Collard  M.D.   On: 01/18/2020 19:50   CT Cervical Spine Wo Contrast  Result Date: 01/18/2020 CLINICAL DATA:  64 year old male with facial trauma. EXAM: CT HEAD WITHOUT CONTRAST CT MAXILLOFACIAL WITHOUT CONTRAST CT CERVICAL SPINE WITHOUT CONTRAST TECHNIQUE: Multidetector CT imaging of the head, cervical spine, and maxillofacial structures were performed using the standard protocol without intravenous contrast. Multiplanar CT image reconstructions of the cervical spine and maxillofacial structures were also generated. COMPARISON:  Brain MRI dated 06/20/2017. FINDINGS: CT HEAD FINDINGS Brain: The ventricles and sulci appropriate size for patient's age. 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 There is no acute intracranial hemorrhage. No mass effect midline shift no extra-axial fluid collection. Vascular: No hyperdense vessel or unexpected calcification. Skull: Normal. Negative for fracture or focal lesion. Other: None CT MAXILLOFACIAL FINDINGS Osseous: There is a fracture of the right nasal bone. No other acute fracture. No mandibular subluxation. Orbits: The globes and retro-orbital fat are preserved. Sinuses: Mild mucoperiosteal thickening of paranasal sinuses. No air-fluid level. The mastoid air cells are clear. Soft tissues: Mild soft tissue swelling over the nose. CT CERVICAL SPINE FINDINGS Alignment: No acute subluxation. Skull base and vertebrae: No acute fracture. Soft tissues and spinal canal: No prevertebral fluid or swelling. No visible canal hematoma. Disc levels: Degenerative changes with anterior osteophyte primarily at C3-C4 and C4-C5. Upper chest: Negative. Other: None IMPRESSION: 1. No acute intracranial pathology. 2. No acute/traumatic cervical spine pathology. 3. Right nasal bone fracture. Electronically Signed   By: Elgie Collard M.D.   On: 01/18/2020 19:50   DG Finger Little Right  Result Date: 01/18/2020 CLINICAL DATA:  Fall with swelling EXAM: RIGHT LITTLE  FINGER 2+V COMPARISON:  None. FINDINGS: Dorsal and radial dislocation of the fifth middle phalanx with respect to the proximal phalanx. Suspected small fracture fragments adjacent to the head of the proximal phalanx. Vascular calcifications. IMPRESSION: Dorsal and radial dislocation of the fifth middle phalanx with respect to the proximal phalanx with suspected small fracture fragments adjacent to the head of the proximal phalanx Electronically Signed   By: Jasmine Pang M.D.   On: 01/18/2020 17:55   DG MINI C-ARM IMAGE ONLY  Result Date: 01/18/2020 There is no interpretation for this exam.  This order is for images obtained during a surgical procedure.  Please See "Surgeries" Tab for more information regarding the procedure.   CT Maxillofacial Wo Contrast  Result Date: 01/18/2020 CLINICAL DATA:  64 year old male with facial trauma. EXAM: CT HEAD WITHOUT CONTRAST CT MAXILLOFACIAL WITHOUT CONTRAST CT CERVICAL SPINE WITHOUT CONTRAST TECHNIQUE: Multidetector CT imaging of the head, cervical spine, and maxillofacial structures were performed using the standard protocol without intravenous contrast. Multiplanar CT image reconstructions of the cervical spine and maxillofacial structures were also generated. COMPARISON:  Brain MRI dated 06/20/2017. FINDINGS:  CT HEAD FINDINGS Brain: The ventricles and sulci appropriate size for patient's age. 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 There is no acute intracranial hemorrhage. No mass effect midline shift no extra-axial fluid collection. Vascular: No hyperdense vessel or unexpected calcification. Skull: Normal. Negative for fracture or focal lesion. Other: None CT MAXILLOFACIAL FINDINGS Osseous: There is a fracture of the right nasal bone. No other acute fracture. No mandibular subluxation. Orbits: The globes and retro-orbital fat are preserved. Sinuses: Mild mucoperiosteal thickening of paranasal sinuses. No air-fluid level. The  mastoid air cells are clear. Soft tissues: Mild soft tissue swelling over the nose. CT CERVICAL SPINE FINDINGS Alignment: No acute subluxation. Skull base and vertebrae: No acute fracture. Soft tissues and spinal canal: No prevertebral fluid or swelling. No visible canal hematoma. Disc levels: Degenerative changes with anterior osteophyte primarily at C3-C4 and C4-C5. Upper chest: Negative. Other: None IMPRESSION: 1. No acute intracranial pathology. 2. No acute/traumatic cervical spine pathology. 3. Right nasal bone fracture. Electronically Signed   By: Elgie Collard M.D.   On: 01/18/2020 19:50      Subjective: Hand with dressing, pain controlled.   Discharge Exam: Vitals:   01/22/20 0438 01/22/20 0856  BP: 138/84 (!) 136/98  Pulse: 70 73  Resp: 16 17  Temp: 98.4 F (36.9 C) 98.1 F (36.7 C)  SpO2: 98% 97%     General: Pt is alert, awake, not in acute distress Cardiovascular: RRR, S1/S2 +, no rubs, no gallops Respiratory: CTA bilaterally, no wheezing, no rhonchi Abdominal: Soft, NT, ND, bowel sounds + Extremities: no edema, no cyanosis    The results of significant diagnostics from this hospitalization (including imaging, microbiology, ancillary and laboratory) are listed below for reference.     Microbiology: Recent Results (from the past 240 hour(s))  Resp Panel by RT-PCR (Flu A&B, Covid) Nasopharyngeal Swab     Status: None   Collection Time: 01/18/20  6:55 PM   Specimen: Nasopharyngeal Swab; Nasopharyngeal(NP) swabs in vial transport medium  Result Value Ref Range Status   SARS Coronavirus 2 by RT PCR NEGATIVE NEGATIVE Final    Comment: (NOTE) SARS-CoV-2 target nucleic acids are NOT DETECTED.  The SARS-CoV-2 RNA is generally detectable in upper respiratory specimens during the acute phase of infection. The lowest concentration of SARS-CoV-2 viral copies this assay can detect is 138 copies/mL. A negative result does not preclude SARS-Cov-2 infection and should not  be used as the sole basis for treatment or other patient management decisions. A negative result may occur with  improper specimen collection/handling, submission of specimen other than nasopharyngeal swab, presence of viral mutation(s) within the areas targeted by this assay, and inadequate number of viral copies(<138 copies/mL). A negative result must be combined with clinical observations, patient history, and epidemiological information. The expected result is Negative.  Fact Sheet for Patients:  BloggerCourse.com  Fact Sheet for Healthcare Providers:  SeriousBroker.it  This test is no t yet approved or cleared by the [place] FDA and  has been authorized for detection and/or diagnosis of SARS-CoV-2 by FDA under an Emergency Use Authorization (EUA). This EUA will remain  in effect (meaning this test can be used) for the duration of the COVID-19 declaration under Section 564(b)(1) of the Act, 21 U.S.C.section 360bbb-3(b)(1), unless the authorization is terminated  or revoked sooner.       Influenza A by PCR NEGATIVE NEGATIVE Final   Influenza B by PCR NEGATIVE NEGATIVE Final    Comment: (NOTE) The Xpert Xpress  SARS-CoV-2/FLU/RSV plus assay is intended as an aid in the diagnosis of influenza from Nasopharyngeal swab specimens and should not be used as a sole basis for treatment. Nasal washings and aspirates are unacceptable for Xpert Xpress SARS-CoV-2/FLU/RSV testing.  Fact Sheet for Patients: BloggerCourse.com  Fact Sheet for Healthcare Providers: SeriousBroker.it  This test is not yet approved or cleared by the [place] FDA and has been authorized for detection and/or diagnosis of SARS-CoV-2 by FDA under an Emergency Use Authorization (EUA). This EUA will remain in effect (meaning this test can be used) for the duration of the COVID-19 declaration under Section  564(b)(1) of the Act, 21 U.S.C. section 360bbb-3(b)(1), unless the authorization is terminated or revoked.  Performed at [facility], [address], [place], [place] [address]   Aerobic/Anaerobic Culture (surgical/deep wound)     Status: None   Collection Time: 01/18/20  9:40 PM   Specimen: Wound  Result Value Ref Range Status   Specimen Description WOUND  Final   Special Requests RIGHT SMALL FINGER  Final   Gram Stain   Final    FEW WBC PRESENT,BOTH PMN AND MONONUCLEAR ABUNDANT GRAM POSITIVE COCCI FEW GRAM NEGATIVE RODS    Culture   Final    ABUNDANT STAPHYLOCOCCUS AUREUS ABUNDANT GROUP B STREP(S.AGALACTIAE)ISOLATED TESTING AGAINST S. AGALACTIAE NOT ROUTINELY PERFORMED DUE TO PREDICTABILITY OF AMP/PEN/VAN SUSCEPTIBILITY. FEW PREVOTELLA MELANINOGENICA BETA LACTAMASE POSITIVE Performed at [facility], [address], [place], [place] [address]    Report Status 01/22/2020 FINAL  Final   Organism ID, Bacteria STAPHYLOCOCCUS AUREUS  Final      Susceptibility   Staphylococcus aureus - MIC*    CIPROFLOXACIN <=0.5 SENSITIVE Sensitive     ERYTHROMYCIN >=8 RESISTANT Resistant     GENTAMICIN <=0.5 SENSITIVE Sensitive     OXACILLIN 0.5 SENSITIVE Sensitive     TETRACYCLINE >=16 RESISTANT Resistant     VANCOMYCIN <=0.5 SENSITIVE Sensitive     TRIMETH/SULFA <=10 SENSITIVE Sensitive     CLINDAMYCIN <=0.25 SENSITIVE Sensitive     RIFAMPIN <=0.5 SENSITIVE Sensitive     Inducible Clindamycin NEGATIVE Sensitive     * ABUNDANT STAPHYLOCOCCUS AUREUS     Labs: BNP (last 3 results) No results for input(s): BNP in the last 8760 hours. Basic Metabolic Panel: Recent Labs  Lab 01/18/20 1835 01/19/20 0157 01/21/20 0904  NA 132* 132* 130*  K 3.8 3.9 3.8  CL 97* 99 102  CO2 23 20* 19*  GLUCOSE 377* 294* 275*  BUN 13 12 12   CREATININE 1.01 0.89 0.87  CALCIUM 9.0 8.3* 7.9*  MG  --  1.6* 1.8  PHOS  --  2.6  --    Liver Function Tests: Recent Labs  Lab 01/18/20 1835  01/19/20 0157  AST 21 18  ALT 21 18  ALKPHOS 97 84  BILITOT 1.0 0.9  PROT 7.1 6.1*  ALBUMIN 3.6 3.1*   No results for input(s): LIPASE, AMYLASE in the last 168 hours. No results for input(s): AMMONIA in the last 168 hours. CBC: Recent Labs  Lab 01/18/20 1835 01/19/20 0157  WBC 7.7 7.6  NEUTROABS 5.8 5.4  HGB 14.7 13.0  HCT 42.8 36.9*  MCV 89.9 87.4  PLT 210 196   Cardiac Enzymes: No results for input(s): CKTOTAL, CKMB, CKMBINDEX, TROPONINI in the last 168 hours. BNP: Invalid input(s): POCBNP CBG: Recent Labs  Lab 01/21/20 1149 01/21/20 1814 01/21/20 2053 01/22/20 0641 01/22/20 1132  GLUCAP 229* 277* 312* 158* 235*   D-Dimer No  results for input(s): DDIMER in the last 72 hours. Hgb A1c No results for input(s): HGBA1C in the last 72 hours. Lipid Profile No results for input(s): CHOL, HDL, LDLCALC, TRIG, CHOLHDL, LDLDIRECT in the last 72 hours. Thyroid function studies No results for input(s): TSH, T4TOTAL, T3FREE, THYROIDAB in the last 72 hours.  Invalid input(s): FREET3 Anemia work up No results for input(s): VITAMINB12, FOLATE, FERRITIN, TIBC, IRON, RETICCTPCT in the last 72 hours. Urinalysis    Component Value Date/Time   COLORURINE STRAW (A) 06/18/2017 2333   APPEARANCEUR CLEAR 06/18/2017 2333   LABSPEC 1.026 06/18/2017 2333   PHURINE 6.0 06/18/2017 2333   GLUCOSEU >=500 (A) 06/18/2017 2333   HGBUR NEGATIVE 06/18/2017 2333   BILIRUBINUR NEGATIVE 06/18/2017 2333   KETONESUR 5 (A) 06/18/2017 2333   PROTEINUR NEGATIVE 06/18/2017 2333   NITRITE NEGATIVE 06/18/2017 2333   LEUKOCYTESUR NEGATIVE 06/18/2017 2333   Sepsis Labs Invalid input(s): PROCALCITONIN,  WBC,  LACTICIDVEN Microbiology Recent Results (from the past 240 hour(s))  Resp Panel by RT-PCR (Flu A&B, Covid) Nasopharyngeal Swab     Status: None   Collection Time: 01/18/20  6:55 PM   Specimen: Nasopharyngeal Swab; Nasopharyngeal(NP) swabs in vial transport medium  Result Value Ref Range  Status   SARS Coronavirus 2 by RT PCR NEGATIVE NEGATIVE Final    Comment: (NOTE) SARS-CoV-2 target nucleic acids are NOT DETECTED.  The SARS-CoV-2 RNA is generally detectable in upper respiratory specimens during the acute phase of infection. The lowest concentration of SARS-CoV-2 viral copies this assay can detect is 138 copies/mL. A negative result does not preclude SARS-Cov-2 infection and should not be used as the sole basis for treatment or other patient management decisions. A negative result may occur with  improper specimen collection/handling, submission of specimen other than nasopharyngeal swab, presence of viral mutation(s) within the areas targeted by this assay, and inadequate number of viral copies(<138 copies/mL). A negative result must be combined with clinical observations, patient history, and epidemiological information. The expected result is Negative.  Fact Sheet for Patients:  BloggerCourse.comhttps://www.fda.gov/media/152166/download  Fact Sheet for Healthcare Providers:  SeriousBroker.ithttps://www.fda.gov/media/152162/download  This test is no t yet approved or cleared by the [place] FDA and  has been authorized for detection and/or diagnosis of SARS-CoV-2 by FDA under an Emergency Use Authorization (EUA). This EUA will remain  in effect (meaning this test can be used) for the duration of the COVID-19 declaration under Section 564(b)(1) of the Act, 21 U.S.C.section 360bbb-3(b)(1), unless the authorization is terminated  or revoked sooner.       Influenza A by PCR NEGATIVE NEGATIVE Final   Influenza B by PCR NEGATIVE NEGATIVE Final    Comment: (NOTE) The Xpert Xpress SARS-CoV-2/FLU/RSV plus assay is intended as an aid in the diagnosis of influenza from Nasopharyngeal swab specimens and should not be used as a sole basis for treatment. Nasal washings and aspirates are unacceptable for Xpert Xpress SARS-CoV-2/FLU/RSV testing.  Fact Sheet for  Patients: BloggerCourse.comhttps://www.fda.gov/media/152166/download  Fact Sheet for Healthcare Providers: SeriousBroker.ithttps://www.fda.gov/media/152162/download  This test is not yet approved or cleared by the [place] FDA and has been authorized for detection and/or diagnosis of SARS-CoV-2 by FDA under an Emergency Use Authorization (EUA). This EUA will remain in effect (meaning this test can be used) for the duration of the COVID-19 declaration under Section 564(b)(1) of the Act, 21 U.S.C. section 360bbb-3(b)(1), unless the authorization is terminated or revoked.  Performed at [facility], [address] [place], [place] [address]   Aerobic/Anaerobic  Culture (surgical/deep wound)     Status: None   Collection Time: 01/18/20  9:40 PM   Specimen: Wound  Result Value Ref Range Status   Specimen Description WOUND  Final   Special Requests RIGHT SMALL FINGER  Final   Gram Stain   Final    FEW WBC PRESENT,BOTH PMN AND MONONUCLEAR ABUNDANT GRAM POSITIVE COCCI FEW GRAM NEGATIVE RODS    Culture   Final    ABUNDANT STAPHYLOCOCCUS AUREUS ABUNDANT GROUP B STREP(S.AGALACTIAE)ISOLATED TESTING AGAINST S. AGALACTIAE NOT ROUTINELY PERFORMED DUE TO PREDICTABILITY OF AMP/PEN/VAN SUSCEPTIBILITY. FEW PREVOTELLA MELANINOGENICA BETA LACTAMASE POSITIVE Performed at [facility], [address], [place], [place] [address]    Report Status 01/22/2020 FINAL  Final   Organism ID, Bacteria STAPHYLOCOCCUS AUREUS  Final      Susceptibility   Staphylococcus aureus - MIC*    CIPROFLOXACIN <=0.5 SENSITIVE Sensitive     ERYTHROMYCIN >=8 RESISTANT Resistant     GENTAMICIN <=0.5 SENSITIVE Sensitive     OXACILLIN 0.5 SENSITIVE Sensitive     TETRACYCLINE >=16 RESISTANT Resistant     VANCOMYCIN <=0.5 SENSITIVE Sensitive     TRIMETH/SULFA <=10 SENSITIVE Sensitive     CLINDAMYCIN <=0.25 SENSITIVE Sensitive     RIFAMPIN <=0.5 SENSITIVE Sensitive     Inducible Clindamycin NEGATIVE Sensitive     * ABUNDANT  STAPHYLOCOCCUS AUREUS     Time coordinating discharge: 40 minutes  SIGNED:   Alba Cory, MD  [facility]

## 2020-01-29 ENCOUNTER — Other Ambulatory Visit: Payer: Self-pay

## 2020-01-29 ENCOUNTER — Inpatient Hospital Stay (HOSPITAL_COMMUNITY)
Admission: AD | Admit: 2020-01-29 | Discharge: 2020-02-02 | DRG: 857 | Disposition: A | Discharge status: Home / Self Care | Payer: Self-pay | Source: Ambulatory Visit | Attending: Internal Medicine | Admitting: Internal Medicine

## 2020-01-29 ENCOUNTER — Emergency Department (HOSPITAL_COMMUNITY)
Admission: EM | Admit: 2020-01-29 | Discharge: 2020-01-29 | Disposition: A | Discharge status: Home / Self Care | Payer: Self-pay | Attending: Emergency Medicine | Admitting: Emergency Medicine

## 2020-01-29 DIAGNOSIS — E119 Type 2 diabetes mellitus without complications: Secondary | ICD-10-CM

## 2020-01-29 DIAGNOSIS — Z89512 Acquired absence of left leg below knee: Secondary | ICD-10-CM

## 2020-01-29 DIAGNOSIS — B952 Enterococcus as the cause of diseases classified elsewhere: Secondary | ICD-10-CM | POA: Diagnosis present

## 2020-01-29 DIAGNOSIS — Z9114 Patient's other noncompliance with medication regimen: Secondary | ICD-10-CM

## 2020-01-29 DIAGNOSIS — E1169 Type 2 diabetes mellitus with other specified complication: Secondary | ICD-10-CM | POA: Diagnosis present

## 2020-01-29 DIAGNOSIS — Z01818 Encounter for other preprocedural examination: Secondary | ICD-10-CM | POA: Insufficient documentation

## 2020-01-29 DIAGNOSIS — B9620 Unspecified Escherichia coli [E. coli] as the cause of diseases classified elsewhere: Secondary | ICD-10-CM | POA: Diagnosis present

## 2020-01-29 DIAGNOSIS — Z8619 Personal history of other infectious and parasitic diseases: Secondary | ICD-10-CM

## 2020-01-29 DIAGNOSIS — Z20822 Contact with and (suspected) exposure to covid-19: Secondary | ICD-10-CM | POA: Diagnosis present

## 2020-01-29 DIAGNOSIS — Z794 Long term (current) use of insulin: Secondary | ICD-10-CM

## 2020-01-29 DIAGNOSIS — E1165 Type 2 diabetes mellitus with hyperglycemia: Secondary | ICD-10-CM | POA: Diagnosis present

## 2020-01-29 DIAGNOSIS — Z9119 Patient's noncompliance with other medical treatment and regimen: Secondary | ICD-10-CM

## 2020-01-29 DIAGNOSIS — L03011 Cellulitis of right finger: Secondary | ICD-10-CM

## 2020-01-29 DIAGNOSIS — Y838 Other surgical procedures as the cause of abnormal reaction of the patient, or of later complication, without mention of misadventure at the time of the procedure: Secondary | ICD-10-CM | POA: Diagnosis present

## 2020-01-29 DIAGNOSIS — F1020 Alcohol dependence, uncomplicated: Secondary | ICD-10-CM | POA: Diagnosis present

## 2020-01-29 DIAGNOSIS — T8141XA Infection following a procedure, superficial incisional surgical site, initial encounter: Principal | ICD-10-CM | POA: Diagnosis present

## 2020-01-29 DIAGNOSIS — L02511 Cutaneous abscess of right hand: Secondary | ICD-10-CM | POA: Diagnosis present

## 2020-01-29 DIAGNOSIS — L089 Local infection of the skin and subcutaneous tissue, unspecified: Secondary | ICD-10-CM

## 2020-01-29 DIAGNOSIS — Z5321 Procedure and treatment not carried out due to patient leaving prior to being seen by health care provider: Secondary | ICD-10-CM | POA: Insufficient documentation

## 2020-01-29 DIAGNOSIS — I10 Essential (primary) hypertension: Secondary | ICD-10-CM | POA: Diagnosis present

## 2020-01-29 DIAGNOSIS — F1010 Alcohol abuse, uncomplicated: Secondary | ICD-10-CM | POA: Diagnosis present

## 2020-01-29 DIAGNOSIS — M869 Osteomyelitis, unspecified: Secondary | ICD-10-CM | POA: Diagnosis present

## 2020-01-29 DIAGNOSIS — Z79899 Other long term (current) drug therapy: Secondary | ICD-10-CM

## 2020-01-29 HISTORY — DX: Type 2 diabetes mellitus without complications: E11.9

## 2020-01-29 HISTORY — DX: Essential (primary) hypertension: I10

## 2020-01-29 HISTORY — DX: Alcohol abuse, uncomplicated: F10.10

## 2020-01-29 HISTORY — DX: Osteomyelitis, unspecified: M86.9

## 2020-01-29 MED ORDER — CHLORHEXIDINE GLUCONATE 4 % EX LIQD
60.0000 mL | Freq: Once | CUTANEOUS | Status: AC
  Administered 2020-01-30: 4 via TOPICAL
  Filled 2020-01-29: qty 60

## 2020-01-29 MED ORDER — POVIDONE-IODINE 10 % EX SWAB: 2.0000 "application " | Freq: Once | CUTANEOUS | Status: DC

## 2020-01-29 NOTE — ED Triage Notes (Signed)
Pt scheduled for surgery tomorrow on right fifth finger. Here today for pre-admission.

## 2020-01-30 ENCOUNTER — Inpatient Hospital Stay (HOSPITAL_COMMUNITY): Payer: Self-pay

## 2020-01-30 ENCOUNTER — Inpatient Hospital Stay (HOSPITAL_COMMUNITY): Payer: Self-pay | Admitting: Anesthesiology

## 2020-01-30 ENCOUNTER — Inpatient Hospital Stay (HOSPITAL_COMMUNITY): Admission: RE | Admit: 2020-01-30 | Payer: Self-pay | Source: Home / Self Care | Admitting: Orthopaedic Surgery

## 2020-01-30 ENCOUNTER — Encounter (HOSPITAL_COMMUNITY): Payer: Self-pay | Admitting: Internal Medicine

## 2020-01-30 ENCOUNTER — Encounter (HOSPITAL_COMMUNITY)
Admission: AD | Disposition: A | Discharge status: Home / Self Care | Payer: Self-pay | Source: Ambulatory Visit | Attending: Internal Medicine

## 2020-01-30 DIAGNOSIS — T148XXA Other injury of unspecified body region, initial encounter: Secondary | ICD-10-CM

## 2020-01-30 DIAGNOSIS — L089 Local infection of the skin and subcutaneous tissue, unspecified: Secondary | ICD-10-CM

## 2020-01-30 DIAGNOSIS — E119 Type 2 diabetes mellitus without complications: Secondary | ICD-10-CM

## 2020-01-30 HISTORY — PX: INCISION AND DRAINAGE OF WOUND: SHX1803

## 2020-01-30 LAB — GLUCOSE, CAPILLARY
Glucose-Capillary: 174 mg/dL — ABNORMAL HIGH (ref 70–99)
Glucose-Capillary: 315 mg/dL — ABNORMAL HIGH (ref 70–99)
Glucose-Capillary: 129 mg/dL — ABNORMAL HIGH (ref 70–99)
Glucose-Capillary: 105 mg/dL — ABNORMAL HIGH (ref 70–99)
Glucose-Capillary: 124 mg/dL — ABNORMAL HIGH (ref 70–99)
Glucose-Capillary: 174 mg/dL — ABNORMAL HIGH (ref 70–99)
Glucose-Capillary: 166 mg/dL — ABNORMAL HIGH (ref 70–99)

## 2020-01-30 LAB — COMPREHENSIVE METABOLIC PANEL
ALT: 17 U/L (ref 0–44)
AST: 19 U/L (ref 15–41)
Albumin: 3 g/dL — ABNORMAL LOW (ref 3.5–5.0)
Alkaline Phosphatase: 69 U/L (ref 38–126)
Anion gap: 9 (ref 5–15)
BUN: 10 mg/dL (ref 8–23)
CO2: 24 mmol/L (ref 22–32)
Calcium: 9.1 mg/dL (ref 8.9–10.3)
Chloride: 100 mmol/L (ref 98–111)
Creatinine, Ser: 0.81 mg/dL (ref 0.61–1.24)
GFR, Estimated: >60 mL/min (ref >60)
Glucose, Bld: 191 mg/dL — ABNORMAL HIGH (ref 70–99)
Potassium: 4.9 mmol/L (ref 3.5–5.1)
Sodium: 133 mmol/L — ABNORMAL LOW (ref 135–145)
Total Bilirubin: 0.7 mg/dL (ref 0.3–1.2)
Total Protein: 6.6 g/dL (ref 6.5–8.1)

## 2020-01-30 LAB — CBC
HCT: 38.4 % — ABNORMAL LOW (ref 39.0–52.0)
Hemoglobin: 13.6 g/dL (ref 13.0–17.0)
MCH: 30.9 pg (ref 26.0–34.0)
MCHC: 35.4 g/dL (ref 30.0–36.0)
MCV: 87.3 fL (ref 80.0–100.0)
Platelets: 345 10*3/uL (ref 150–400)
RBC: 4.4 MIL/uL (ref 4.22–5.81)
RDW: 11.7 % (ref 11.5–15.5)
WBC: 7.4 10*3/uL (ref 4.0–10.5)
nRBC: 0 % (ref 0.0–0.2)

## 2020-01-30 LAB — MAGNESIUM: Magnesium: 1.8 mg/dL (ref 1.7–2.4)

## 2020-01-30 LAB — LACTIC ACID, PLASMA: Lactic Acid, Venous: 1.3 mmol/L (ref 0.5–1.9)

## 2020-01-30 LAB — PHOSPHORUS: Phosphorus: 3.6 mg/dL (ref 2.5–4.6)

## 2020-01-30 LAB — MRSA PCR SCREENING: MRSA by PCR: NEGATIVE

## 2020-01-30 SURGERY — IRRIGATION AND DEBRIDEMENT WOUND
Anesthesia: General | Site: Finger | Laterality: Right

## 2020-01-30 MED ORDER — INSULIN ASPART 100 UNIT/ML ~~LOC~~ SOLN
0.0000 [IU] | SUBCUTANEOUS | Status: DC
  Administered 2020-01-30: 2 [IU] via SUBCUTANEOUS
  Administered 2020-01-30: 7 [IU] via SUBCUTANEOUS
  Administered 2020-01-30: 1 [IU] via SUBCUTANEOUS
  Administered 2020-01-31: 3 [IU] via SUBCUTANEOUS
  Administered 2020-01-31 (×2): 2 [IU] via SUBCUTANEOUS
  Administered 2020-01-31: 3 [IU] via SUBCUTANEOUS
  Administered 2020-02-01: 1 [IU] via SUBCUTANEOUS
  Administered 2020-02-01: 5 [IU] via SUBCUTANEOUS
  Administered 2020-02-01: 1 [IU] via SUBCUTANEOUS
  Administered 2020-02-01: 2 [IU] via SUBCUTANEOUS
  Administered 2020-02-02: 17:00:00 5 [IU] via SUBCUTANEOUS
  Administered 2020-02-02: 12:00:00 7 [IU] via SUBCUTANEOUS
  Administered 2020-02-02: 08:00:00 9 [IU] via SUBCUTANEOUS
  Administered 2020-02-02 (×2): 5 [IU] via SUBCUTANEOUS

## 2020-01-30 MED ORDER — THIAMINE HCL 100 MG PO TABS
100.0000 mg | ORAL_TABLET | Freq: Every day | ORAL | Status: DC
  Administered 2020-01-30 – 2020-02-02 (×4): 100 mg via ORAL
  Filled 2020-01-30 (×4): qty 1

## 2020-01-30 MED ORDER — LORAZEPAM 2 MG/ML IJ SOLN: 1.0000 mg | INTRAMUSCULAR | Status: AC | PRN

## 2020-01-30 MED ORDER — ADULT MULTIVITAMIN W/MINERALS CH
1.0000 | ORAL_TABLET | Freq: Every day | ORAL | Status: DC
  Administered 2020-01-30 – 2020-02-02 (×4): 1 via ORAL
  Filled 2020-01-30 (×4): qty 1

## 2020-01-30 MED ORDER — ACETAMINOPHEN 10 MG/ML IV SOLN: 1000.0000 mg | Freq: Once | INTRAVENOUS | Status: DC | PRN

## 2020-01-30 MED ORDER — LACTATED RINGERS IV SOLN: INTRAVENOUS | Status: DC | PRN

## 2020-01-30 MED ORDER — CHLORHEXIDINE GLUCONATE 0.12 % MT SOLN
15.0000 mL | Freq: Once | OROMUCOSAL | Status: AC
  Administered 2020-01-30: 15 mL via OROMUCOSAL
  Filled 2020-01-30: qty 15

## 2020-01-30 MED ORDER — SODIUM CHLORIDE 0.9 % IV SOLN: INTRAVENOUS | Status: AC

## 2020-01-30 MED ORDER — FOLIC ACID 1 MG PO TABS
1.0000 mg | ORAL_TABLET | Freq: Every day | ORAL | Status: DC
  Administered 2020-01-30 – 2020-02-02 (×4): 1 mg via ORAL
  Filled 2020-01-30 (×4): qty 1

## 2020-01-30 MED ORDER — VANCOMYCIN HCL 750 MG/150ML IV SOLN
750.0000 mg | Freq: Two times a day (BID) | INTRAVENOUS | Status: DC
  Administered 2020-01-31: 750 mg via INTRAVENOUS
  Filled 2020-01-30: qty 150

## 2020-01-30 MED ORDER — HYDROCODONE-ACETAMINOPHEN 5-325 MG PO TABS
1.0000 | ORAL_TABLET | Freq: Four times a day (QID) | ORAL | Status: DC | PRN
  Administered 2020-01-30: 1 via ORAL
  Administered 2020-01-31 (×2): 2 via ORAL
  Administered 2020-01-31 – 2020-02-01 (×2): 1 via ORAL
  Filled 2020-01-30: qty 1
  Filled 2020-01-30: qty 2
  Filled 2020-01-30 (×2): qty 1
  Filled 2020-01-30: qty 2

## 2020-01-30 MED ORDER — OXYCODONE HCL 5 MG PO TABS: 5.0000 mg | ORAL_TABLET | Freq: Once | ORAL | Status: DC | PRN

## 2020-01-30 MED ORDER — HYDROMORPHONE HCL 1 MG/ML IJ SOLN
INTRAMUSCULAR | Status: AC
  Filled 2020-01-30: qty 1

## 2020-01-30 MED ORDER — LIDOCAINE HCL (CARDIAC) PF 100 MG/5ML IV SOSY
PREFILLED_SYRINGE | INTRAVENOUS | Status: DC | PRN
  Administered 2020-01-30: 60 mg via INTRATRACHEAL

## 2020-01-30 MED ORDER — HYDROMORPHONE HCL 1 MG/ML IJ SOLN
0.2500 mg | INTRAMUSCULAR | Status: DC | PRN
  Administered 2020-01-30: 0.5 mg via INTRAVENOUS

## 2020-01-30 MED ORDER — MIDAZOLAM HCL 5 MG/5ML IJ SOLN
INTRAMUSCULAR | Status: DC | PRN
  Administered 2020-01-30: 2 mg via INTRAVENOUS

## 2020-01-30 MED ORDER — MORPHINE SULFATE (PF) 4 MG/ML IV SOLN: 4.0000 mg | INTRAVENOUS | Status: DC | PRN

## 2020-01-30 MED ORDER — ONDANSETRON HCL 4 MG/2ML IJ SOLN
INTRAMUSCULAR | Status: DC | PRN
  Administered 2020-01-30: 4 mg via INTRAVENOUS

## 2020-01-30 MED ORDER — VANCOMYCIN HCL 1000 MG IV SOLR
INTRAVENOUS | Status: DC | PRN
  Administered 2020-01-30: 1000 mg via INTRAVENOUS

## 2020-01-30 MED ORDER — SODIUM CHLORIDE 0.9 % IR SOLN
Status: DC | PRN
  Administered 2020-01-30: 3000 mL

## 2020-01-30 MED ORDER — ONDANSETRON HCL 4 MG/2ML IJ SOLN: 4.0000 mg | Freq: Once | INTRAMUSCULAR | Status: DC | PRN

## 2020-01-30 MED ORDER — OXYCODONE HCL 5 MG/5ML PO SOLN: 5.0000 mg | Freq: Once | ORAL | Status: DC | PRN

## 2020-01-30 MED ORDER — LACTATED RINGERS IV SOLN: INTRAVENOUS | Status: DC

## 2020-01-30 MED ORDER — 0.9 % SODIUM CHLORIDE (POUR BTL) OPTIME
TOPICAL | Status: DC | PRN
  Administered 2020-01-30: 1000 mL

## 2020-01-30 MED ORDER — THIAMINE HCL 100 MG/ML IJ SOLN
100.0000 mg | Freq: Every day | INTRAMUSCULAR | Status: DC
  Administered 2020-01-30: 100 mg via INTRAVENOUS
  Filled 2020-01-30: qty 2

## 2020-01-30 MED ORDER — VANCOMYCIN HCL IN DEXTROSE 1-5 GM/200ML-% IV SOLN
INTRAVENOUS | Status: AC
  Filled 2020-01-30: qty 200

## 2020-01-30 MED ORDER — BUPIVACAINE HCL (PF) 0.25 % IJ SOLN
INTRAMUSCULAR | Status: AC
  Filled 2020-01-30: qty 20

## 2020-01-30 MED ORDER — LORAZEPAM 1 MG PO TABS: 1.0000 mg | ORAL_TABLET | ORAL | Status: AC | PRN

## 2020-01-30 MED ORDER — FENTANYL CITRATE (PF) 250 MCG/5ML IJ SOLN
INTRAMUSCULAR | Status: DC | PRN
Start: 2020-01-30 — End: 2020-01-30
  Administered 2020-01-30: 50 ug via INTRAVENOUS
  Administered 2020-01-30: 100 ug via INTRAVENOUS

## 2020-01-30 SURGICAL SUPPLY — 39 items
BNDG ELASTIC 3X5.8 VLCR STR LF (GAUZE/BANDAGES/DRESSINGS) ×2 IMPLANT
BNDG ESMARK 4X9 LF (GAUZE/BANDAGES/DRESSINGS) ×2 IMPLANT
BNDG GAUZE ELAST 4 BULKY (GAUZE/BANDAGES/DRESSINGS) ×5 IMPLANT
CANISTER WOUND CARE 500ML ATS (WOUND CARE) ×2 IMPLANT
CORD BIPOLAR FORCEPS 12FT (ELECTRODE) ×3 IMPLANT
COVER SURGICAL LIGHT HANDLE (MISCELLANEOUS) ×3 IMPLANT
CUFF TOURN SGL QUICK 18X4 (TOURNIQUET CUFF) ×3 IMPLANT
DRAPE U-SHAPE 47X51 STRL (DRAPES) ×3 IMPLANT
DRSG VAC ATS SM SENSATRAC (GAUZE/BANDAGES/DRESSINGS) ×2 IMPLANT
GAUZE SPONGE 4X4 12PLY STRL (GAUZE/BANDAGES/DRESSINGS) ×3 IMPLANT
GLOVE BIOGEL PI IND STRL 8 (GLOVE) ×1 IMPLANT
GLOVE BIOGEL PI INDICATOR 8 (GLOVE) ×2
GLOVE SURG SYN 7.5  E (GLOVE) ×3
GLOVE SURG SYN 7.5 E (GLOVE) ×1 IMPLANT
GLOVE SURG SYN 7.5 PF PI (GLOVE) ×1 IMPLANT
GOWN STRL REUS W/ TWL LRG LVL3 (GOWN DISPOSABLE) ×1 IMPLANT
GOWN STRL REUS W/TWL LRG LVL3 (GOWN DISPOSABLE) ×6
KIT BASIN OR (CUSTOM PROCEDURE TRAY) ×3 IMPLANT
KIT TURNOVER KIT B (KITS) ×3 IMPLANT
MANIFOLD NEPTUNE II (INSTRUMENTS) ×3 IMPLANT
NDL HYPO 25GX1X1/2 BEV (NEEDLE) IMPLANT
NEEDLE HYPO 25GX1X1/2 BEV (NEEDLE) ×3 IMPLANT
NS IRRIG 1000ML POUR BTL (IV SOLUTION) ×3 IMPLANT
PACK ORTHO EXTREMITY (CUSTOM PROCEDURE TRAY) ×3 IMPLANT
PAD ARMBOARD 7.5X6 YLW CONV (MISCELLANEOUS) ×3 IMPLANT
SET CYSTO W/LG BORE CLAMP LF (SET/KITS/TRAYS/PACK) ×3 IMPLANT
SLEEVE SCD COMPRESS KNEE MED (MISCELLANEOUS) ×2 IMPLANT
SOL PREP POV-IOD 4OZ 10% (MISCELLANEOUS) ×2 IMPLANT
SOL PREP PROV IODINE SCRUB 4OZ (MISCELLANEOUS) ×2 IMPLANT
SPONGE LAP 4X18 RFD (DISPOSABLE) ×3 IMPLANT
SWAB CULTURE ESWAB REG 1ML (MISCELLANEOUS) ×4 IMPLANT
SYR CONTROL 10ML LL (SYRINGE) ×2 IMPLANT
TOWEL GREEN STERILE (TOWEL DISPOSABLE) ×3 IMPLANT
TOWEL GREEN STERILE FF (TOWEL DISPOSABLE) ×3 IMPLANT
TUBE CONNECTING 12'X1/4 (SUCTIONS) ×1
TUBE CONNECTING 12X1/4 (SUCTIONS) ×2 IMPLANT
TUBING TUR DISP (UROLOGICAL SUPPLIES) ×2 IMPLANT
UNDERPAD 30X36 HEAVY ABSORB (UNDERPADS AND DIAPERS) ×4 IMPLANT
YANKAUER SUCT BULB TIP NO VENT (SUCTIONS) ×3 IMPLANT

## 2020-01-30 NOTE — Progress Notes (Signed)
Pharmacy Antibiotic Note  Ryan Mcbride is a 64 y.o. male admitted on 01/29/2020 with wound infection.  Pharmacy has been consulted for vancomycin dosing.  S/p amputation of R small finger with irrigation debridement into distal palm. WBC 7.4, LA 1.3, Scr 0.8 (CrCl 80 mL/min). Afebrile. Had recently been treated with vancomycin/metrondiazole and discharged on augmentin in November. Received vancomycin 1g on 12/8@1958 .   Plan: Start vancomycin 750 mg IV every 12 hours Will monitor renal fx, cx results, clinical pic, and duration of abx  Height: 5\' 5"  (165.1 cm) Weight: 68 kg (149 lb 15.3 oz) IBW/kg (Calculated) : 61.5  Temp (24hrs), Avg:98.2 F (36.8 C), Min:97.6 F (36.4 C), Max:98.7 F (37.1 C)  Recent Labs  Lab 01/30/20 0349 01/30/20 0456  WBC 7.4  --   CREATININE 0.81  --   LATICACIDVEN  --  1.3    Estimated Creatinine Clearance: 80.1 mL/min (by C-G formula based on SCr of 0.81 mg/dL).    No Known Allergies  Antimicrobials this admission: Vancomycin 12/8 >>   Dose adjustments this admission: N/A  Microbiology results: 11/26 Wound cx: MSSA (R erythromycin, tetracycline), group B strep, few prevotella melaninogenica (beta-lactamase positive) 12/8 Wound Cx: ngtd  Thank you for allowing pharmacy to be a part of this patient's care.  14/8, PharmD, BCCCP Clinical Pharmacist  Phone: [phone] 01/30/2020 8:48 PM  Please check AMION for all [facility] Pharmacy phone numbers After 10:00 PM, call Main Pharmacy [phone]

## 2020-01-30 NOTE — H&P (Signed)
ORTHOPAEDIC H&P  PCP:  Patient, [name]  Chief Complaint: Right small finger infection  HPI: Ryan Mcbride is a 64 y.o. male who complains of right small finger infection.  He previously had an open right small finger PIP fracture dislocation in the setting of poorly controlled diabetes.  This went untreated for several days prior to presenting to the ER.  At his initial presentation to the ER approximately 10 days ago he had a purulent infection to his small finger with an open dislocation and fracture to the PIP joint.  He underwent open irrigation debridement of his finger as well as reduction of the PIP joint.  His cultures from that surgery were positive for abundant staph aureus, abundant group B strep as well as propatella. He was discharged on antibiotics and subsequently presented to my clinic yesterday.  At that time he was found to have necrosis of his right small finger distal to the PIP joint as well as continued purulent discharge from the palm and volar wounds.  He was sent back to the ER for admission to the hospitalist service.  He is scheduled today for revision amputation of his right small finger as well as repeat irrigation debridement of the hand and finger.  Past Medical History:  Diagnosis Date  . Alcohol abuse   . Diabetes mellitus without complication (HCC)   . Hypertension   . Osteomyelitis [facility])    Past Surgical History:  Procedure Laterality Date  . amputation left leg    . I & D EXTREMITY Right 01/18/2020   Procedure: IRRIGATION AND DEBRIDEMENT SMALL FINGER  AND OPEN FRACTURE,,OPEN REDUCTION OF RIGHT PIP JOINT;  Surgeon: Ernest Mallick, MD;  Location: [facility] OR;  Service: Orthopedics;  Laterality: Right;   Social History   Socioeconomic History  . Marital status: Divorced    Spouse name: Not on file  . Number of children: Not on file  . Years of education: Not on file  . Highest education level: Not on file  Occupational History  . Not on file   Tobacco Use  . Smoking status: Never Smoker  . Smokeless tobacco: Never Used  Substance and Sexual Activity  . Alcohol use: Yes  . Drug use: Never  . Sexual activity: Not on file  Other Topics Concern  . Not on file  Social History Narrative  . Not on file   Social Determinants of Health   Financial Resource Strain:   . Difficulty of Paying Living Expenses: Not on file  Food Insecurity:   . Worried About Programme researcher, broadcasting/film/video in the Last Year: Not on file  . Ran Out of Food in the Last Year: Not on file  Transportation Needs:   . Lack of Transportation (Medical): Not on file  . Lack of Transportation (Non-Medical): Not on file  Physical Activity:   . Days of Exercise per Week: Not on file  . Minutes of Exercise per Session: Not on file  Stress:   . Feeling of Stress : Not on file  Social Connections:   . Frequency of Communication with Friends and Family: Not on file  . Frequency of Social Gatherings with Friends and Family: Not on file  . Attends Religious Services: Not on file  . Active Member of Clubs or Organizations: Not on file  . Attends Banker Meetings: Not on file  . Marital Status: Not on file   History reviewed. No pertinent family history. No Known Allergies  Prior to Admission medications   Medication Sig Start Date End Date Taking? Authorizing Provider  amoxicillin-clavulanate (AUGMENTIN) 875-125 MG tablet Take 1 tablet by mouth every 12 (twelve) hours for 10 days. 01/22/20 02/01/20  Regalado, Jon Billings A, MD  folic acid (FOLVITE) 1 MG tablet Take 1 tablet (1 mg total) by mouth daily. 01/22/20   Regalado, Belkys A, MD  insulin aspart protamine- aspart (NOVOLOG MIX 70/30) (70-30) 100 UNIT/ML injection Inject 0.14 mLs (14 Units total) into the skin 2 (two) times daily with a meal. 01/22/20   Regalado, Belkys A, MD  Insulin Syringe 27G X 1/2" 1 ML MISC 1 application by Does not apply route 2 (two) times daily. 01/22/20   Regalado, Belkys A, MD   lisinopril (ZESTRIL) 20 MG tablet Take 1 tablet (20 mg total) by mouth daily. 01/22/20   Regalado, Belkys A, MD  metFORMIN (GLUCOPHAGE) 1000 MG tablet Take 1 tablet (1,000 mg total) by mouth 2 (two) times daily with a meal. 01/22/20   Regalado, Belkys A, MD  Multiple Vitamin (MULTIVITAMIN WITH MINERALS) TABS tablet Take 1 tablet by mouth daily. 01/22/20   Regalado, Belkys A, MD  oxyCODONE-acetaminophen (PERCOCET/ROXICET) 5-325 MG tablet Take 1 tablet by mouth every 4 (four) hours as needed for moderate pain. 01/22/20   Regalado, Belkys A, MD  polyethylene glycol (MIRALAX / GLYCOLAX) 17 g packet Take 17 g by mouth daily as needed for mild constipation. 01/22/20   Regalado, Belkys A, MD  polyethylene glycol powder (GLYCOLAX/MIRALAX) 17 GM/SCOOP powder Take 17 g by mouth daily as needed. 01/22/20   [provider]  thiamine 100 MG tablet Take 1 tablet (100 mg total) by mouth daily. 01/23/20   Regalado, Belkys A, MD  vitamin B-12 (CYANOCOBALAMIN) 100 MCG tablet Take 100 mg by mouth daily. 01/22/20   [provider]   DG Hand Complete Right  Result Date: 01/30/2020 CLINICAL DATA:  Wound infection. EXAM: RIGHT HAND - COMPLETE 3+ VIEW COMPARISON:  01/18/2020 FINDINGS: The fifth digit PIP joint remains located. No acute fracture. Remote distal radius fracture with settling and ulnar positive variance. Remote boxer's fracture with healed angulation. Interphalangeal and MCP osteoarthritis. Extensive arterial calcification. No evidence of osteomyelitis. IMPRESSION: 1. No acute finding. 2. Chronic findings are described above. Electronically Signed   By: Marnee Spring M.D.   On: 01/30/2020 06:35    Positive ROS: All other systems have been reviewed and were otherwise negative with the exception of those mentioned in the HPI and as above.  Physical Exam: General: Alert, no acute distress Cardiovascular: No edema Respiratory: No cyanosis, no use of accessory musculature Psychiatric:  Patient is competent for consent with normal mood and affect  MUSCULOSKELETAL: Examination of the right hand shows a necrotic finger distal to the PIP joint. Proximal to this there is erythema and swelling along the digit and into the palm. With palpation, purulence can be expressed from the volar wound. He has tenderness to palpation along the volar aspect of the small finger and into the palm over the A1 pulley. This is through the area of the previous surgical incisions. Distally the finger is necrotic with and without sensation. No tenderness or erythema involving the ring finger or elsewhere in the hand.  Assessment: Right small finger infection with distal necrosis  Plan: OR today for amputation of the right small finger and repeat I&D of the right small finger and hand.  Risks, benefits and alternatives of the procedure were again discussed with the patient.  These risks  include but are not limited to infection, bleeding, damage to surrounding structures including blood vessels and nerves, pain, stiffness, and need for additional procedures.  Informed consent was obtained of the patient's right arm was marked.  Plan for continued care under the hospitalist service postoperatively.  Patient will likely need a repeat I&D of the hand later this week ending intraoperative findings.    Ernest Mallick, MD [phone]   01/30/2020 7:02 PM

## 2020-01-30 NOTE — TOC Initial Note (Signed)
Transition of Care [facility]) - Initial/Assessment Note    Patient Details  Name: Ryan Mcbride MRN: [id number] Date of Birth: Nov 16, 1955  [facility]) CM/SW Contact:    Kingsley Plan, RN Phone Number: 01/30/2020, 8:24 AM  Clinical Narrative:                 Patient recent discharge from hospital. Lives with daughter. Has an follow up appointment with Gwinda Passe February 04, 2020 at 2:30 pm, information on AVS.   At last admission patient was provided a glucometer.   Changed pharmacy to [facility]. If patient used [facility] at last discharge , he will not be eligible. Will continue to follow for discharge needs.    Expected Discharge Plan: Home/Self Care Barriers to Discharge: Continued Medical Work up   Patient Goals and CMS Choice Patient states their goals for this hospitalization and ongoing recovery are:: to return to home      Expected Discharge Plan and Services Expected Discharge Plan: Home/Self Care   Discharge Planning Services: NA   Living arrangements for the past 2 months: Single Family Home                           HH Arranged: NA          Prior Living Arrangements/Services Living arrangements for the past 2 months: Single Family Home Lives with:: Adult Children Patient language and need for interpreter reviewed:: Yes Do you feel safe going back to the place where you live?: Yes            Criminal Activity/Legal Involvement Pertinent to Current Situation/Hospitalization: No - Comment as needed  Activities of Daily Living Home Assistive Devices/Equipment: Prosthesis ADL Screening (condition at time of admission) Patient's cognitive ability adequate to safely complete daily activities?: Yes Is the patient deaf or have difficulty hearing?: No Does the patient have difficulty seeing, even when wearing glasses/contacts?: No Does the patient have difficulty concentrating, remembering, or making decisions?: No Patient able to  express need for assistance with ADLs?: Yes Does the patient have difficulty dressing or bathing?: No Independently performs ADLs?: Yes (appropriate for developmental age) Does the patient have difficulty walking or climbing stairs?: Yes Weakness of Legs: Left Weakness of Arms/Hands: None  Permission Sought/Granted   Permission granted to share information with : No              Emotional Assessment              Admission diagnosis:  Wound infection [T14.8XXA, L08.9] Patient Active Problem List   Diagnosis Date Noted  . Wound infection 01/30/2020  . Type 2 diabetes mellitus (HCC) 01/30/2020  . Cellulitis of finger of right hand 01/18/2020  . Closed fracture dislocation of proximal interphalangeal (PIP) joint of finger 01/18/2020  . Uncontrolled type 2 diabetes mellitus with hyperglycemia, without long-term current use of insulin (HCC) 01/18/2020  . Essential hypertension 01/18/2020  . Alcohol abuse 01/18/2020  . Seizure [facility])    PCP:  Patient, [name] Pharmacy:   [facility] [address], [place] - [address] [place] [address] Phone: [phone] Fax: [phone]  [facility] [address] [place] - [address] [place] [address] Phone: [phone] Fax: [phone]  [facility] [place], [place] - [address] [place] [address] Phone: [phone] Fax: [phone]  Social Determinants of Health (SDOH) Interventions    Readmission Risk Interventions Readmission Risk Prevention Plan 01/22/2020  Post Dischage Appt Complete  Medication Screening Complete  Transportation Screening Complete  Some recent data might be hidden

## 2020-01-30 NOTE — Progress Notes (Signed)
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  Patient was noted to have pus draining from the wound with necrotic tissue present.  Dr. Roney Mans request direct admission for surgical intervention.   Recently admitted from 01/18/2020 for right fifth digit laceration and found to have an open right fifth digit PIP dislocation with volar fracture of the middle phalanx with secondary infection.  Patient underwent irrigation debridement by Dr. Roney Mans and treated with IV Vanco/Flagyl and discharged on Augmentin.   Assessment & Plan:   Principal Problem:   Wound infection Active Problems:   Essential hypertension   Alcohol abuse   Type 2 diabetes mellitus (HCC)   Wound infection, right fifth digit Patient recently hospitalized following fall with laceration of the right fifth digit with open right fifth digit PIP dislocation with volar fracture and secondary infection.  Underwent irrigation debridement and discharged home on oral antibiotics.  Now representing with purulent drainage from wound with necrotic tissue.  X-ray right hand with no acute findings, remote distal radius fracture/boxer's fracture. --Orthopedic hand surgery following, Dr. Roney Mans; appreciate assistance --Plan operative management today --Holding antibiotics until obtain operative wound culture --Continue n.p.o.  Type 2 diabetes mellitus Hemoglobin A1c 11.8 on 01/19/2020, poorly controlled.  Home regimen includes Metformin 1000 mg p.o. twice daily, NPH 70/3014  units twice daily. --Holding oral hypoglycemics while inpatient; holding home NPH while n.p.o. --Insulin sliding scale for coverage --CBGs every 4 hours  EtOH use disorder Discussed with patient need for complete EtOH cessation. --CIWAA protocol with symptom triggered Ativan --Folate, thiamine, multivitamin  Essential hypertension On lisinopril 20 mg p.o. daily outpatient. --BP 139/83 --Continue to hold home antihypertensives for now --Continue monitor BP closely   DVT prophylaxis: SCDs Code Status: Full code Family Communication: No family present at bedside this morning  Disposition Plan:  Status is: Inpatient  Remains inpatient appropriate because:Ongoing active pain requiring inpatient pain management, Ongoing diagnostic testing needed not appropriate for outpatient work up, Unsafe d/c plan, IV treatments appropriate due to intensity of illness or inability to take PO and Inpatient level of care appropriate due to severity of illness   Dispo: The patient is from: Home              Anticipated d/c is to: Home              Anticipated d/c date is: 3 days              Patient currently is not medically stable to d/c.    Consultants:   Orthopedic hand surgery, Dr. Roney Mans  Procedures:   None  Antimicrobials:   None   Subjective: Patient seen and examined bedside, resting comfortably.  Awaiting operative management later this afternoon.  No complaints or concerns at this time.  Denies headache, no chest pain, no palpitations, no shortness of breath, no abdominal pain, no fever/chills/night sweats, no nausea/vomiting/diarrhea.  No acute events overnight per nursing staff.  Objective: Vitals:   01/29/20 2120 01/29/20 2334  BP: (!) 155/93 135/87  Pulse: 77 69  Resp: 18 18  Temp: 98.7 F (37.1  C) 98.7 F (37.1 C)  TempSrc: Oral Oral  SpO2: 95% 95%  Weight: 68 kg     Intake/Output Summary (Last 24 hours) at 01/30/2020 1532 Last data filed at 01/30/2020  1400 Gross per 24 hour  Intake --  Output 1300 ml  Net -1300 ml   Filed Weights   01/29/20 2120  Weight: 68 kg    Examination:  General exam: Appears calm and comfortable  Respiratory system: Clear to auscultation. Respiratory effort normal. Cardiovascular system: S1 & S2 heard, RRR. No JVD, murmurs, rubs, gallops or clicks. No pedal edema. Gastrointestinal system: Abdomen is nondistended, soft and nontender. No organomegaly or masses felt. Normal bowel sounds heard. Central nervous system: Alert and oriented. No focal neurological deficits. Extremities: Symmetric 5 x 5 power.  Noted left BKA Skin: Right hand with dressing/splint in place Psychiatry: Judgement and insight appear normal. Mood & affect appropriate.     Data Reviewed: I have personally reviewed following labs and imaging studies  CBC: Recent Labs  Lab 01/30/20 0349  WBC 7.4  HGB 13.6  HCT 38.4*  MCV 87.3  PLT 345   Basic Metabolic Panel: Recent Labs  Lab 01/30/20 0349  NA 133*  K 4.9  CL 100  CO2 24  GLUCOSE 191*  BUN 10  CREATININE 0.81  CALCIUM 9.1  MG 1.8  PHOS 3.6   GFR: Estimated Creatinine Clearance: 80.1 mL/min (by C-G formula based on SCr of 0.81 mg/dL). Liver Function Tests: Recent Labs  Lab 01/30/20 0349  AST 19  ALT 17  ALKPHOS 69  BILITOT 0.7  PROT 6.6  ALBUMIN 3.0*   No results for input(s): LIPASE, AMYLASE in the last 168 hours. No results for input(s): AMMONIA in the last 168 hours. Coagulation Profile: No results for input(s): INR, PROTIME in the last 168 hours. Cardiac Enzymes: No results for input(s): CKTOTAL, CKMB, CKMBINDEX, TROPONINI in the last 168 hours. BNP (last 3 results) No results for input(s): PROBNP in the last 8760 hours. HbA1C: No results for input(s): HGBA1C in the last 72 hours. CBG: Recent Labs  Lab 01/30/20 0424 01/30/20 0740 01/30/20 1139  GLUCAP 174* 315* 129*   Lipid Profile: No results for input(s): CHOL, HDL, LDLCALC, TRIG,  CHOLHDL, LDLDIRECT in the last 72 hours. Thyroid Function Tests: No results for input(s): TSH, T4TOTAL, FREET4, T3FREE, THYROIDAB in the last 72 hours. Anemia Panel: No results for input(s): VITAMINB12, FOLATE, FERRITIN, TIBC, IRON, RETICCTPCT in the last 72 hours. Sepsis Labs: Recent Labs  Lab 01/30/20 0456  LATICACIDVEN 1.3    Recent Results (from the past 240 hour(s))  MRSA PCR Screening     Status: None   Collection Time: 01/30/20 12:51 AM  Result Value Ref Range Status   MRSA by PCR NEGATIVE NEGATIVE Final    Comment:        The GeneXpert MRSA Assay (FDA approved for NASAL specimens only), is one component of a comprehensive MRSA colonization surveillance program. It is not intended to diagnose MRSA infection nor to guide or monitor treatment for MRSA infections. Performed at [facility], [address] [place], [place] [address]          Radiology Studies: DG Hand Complete Right  Result Date: 01/30/2020 CLINICAL DATA:  Wound infection. EXAM: RIGHT HAND - COMPLETE 3+ VIEW COMPARISON:  01/18/2020 FINDINGS: The fifth digit PIP joint remains located. No acute fracture. Remote distal radius fracture with settling and ulnar positive variance. Remote boxer's fracture with healed angulation. Interphalangeal and MCP  osteoarthritis. Extensive arterial calcification. No evidence of osteomyelitis. IMPRESSION: 1. No acute finding. 2. Chronic findings are described above. Electronically Signed   By: Marnee Spring M.D.   On: 01/30/2020 06:35        Scheduled Meds: . folic acid  1 mg Oral Daily  . insulin aspart  0-9 Units Subcutaneous Q4H  . multivitamin with minerals  1 tablet Oral Daily  . povidone-iodine  2 application Topical Once  . thiamine  100 mg Oral Daily   Or  . thiamine  100 mg Intravenous Daily   Continuous Infusions: . sodium chloride 125 mL/hr at 01/30/20 0522     LOS: 1 day    Time spent: 38 minutes spent on chart review, discussion with  nursing staff, consultants, updating family and interview/physical exam; more than 50% of that time was spent in counseling and/or coordination of care.    Alvira Philips [place], DO [facility] Available via Epic secure chat 7am-7pm After these hours, please refer to coverage provider listed on [email or web address] 01/30/2020, 3:32 PM

## 2020-01-30 NOTE — Anesthesia Postprocedure Evaluation (Signed)
Anesthesia Post Note  Patient: Foday Cone  Procedure(s) Performed: Right small finger revision amputation, irrigation and debridement with wound vac placement (Right Finger)     Patient location during evaluation: PACU Anesthesia Type: General Level of consciousness: awake and alert Pain management: pain level controlled Vital Signs Assessment: post-procedure vital signs reviewed and stable Respiratory status: spontaneous breathing, nonlabored ventilation, respiratory function stable and patient connected to nasal cannula oxygen Cardiovascular status: blood pressure returned to baseline and stable Postop Assessment: no apparent nausea or vomiting Anesthetic complications: no   No complications documented.  Last Vitals:  Vitals:   01/30/20 2100 01/30/20 2115  BP: (!) 146/81 136/80  Pulse: 75 69  Resp: (!) 25 20  Temp:  36.7 C  SpO2: 98% 98%    Last Pain:  Vitals:   01/30/20 2115  PainSc: Asleep                 Earl Lites P Milagros Middendorf

## 2020-01-30 NOTE — Progress Notes (Signed)
Report called to short stay nurse. Pt transported down for procedure by OR transporter. Pt GCS 15 appears calm.

## 2020-01-30 NOTE — Transfer of Care (Signed)
Immediate Anesthesia Transfer of Care Note  Patient: [name]  Procedure(s) Performed: Right small finger revision amputation, irrigation and debridement and surgery as indicated (Right )  Patient Location: PACU  Anesthesia Type:General  Level of Consciousness: drowsy  Airway & Oxygen Therapy: Patient Spontanous Breathing and Patient connected to face mask oxygen  Post-op Assessment: Report given to RN and Post -op Vital signs reviewed and stable  Post vital signs: Reviewed and stable  Last Vitals:  Vitals Value Taken Time  BP 124/74 01/30/20 2030  Temp    Pulse 74 01/30/20 2030  Resp 17 01/30/20 2030  SpO2 100 % 01/30/20 2030  Vitals shown include unvalidated device data.  Last Pain:  Vitals:   01/30/20 0800  PainSc: 0-No pain         Complications: No complications documented.

## 2020-01-30 NOTE — Progress Notes (Signed)
Pt had not had COVID test prior to coming to pre-op area. Per patient had stayed quarantined since being discharged from hospital. Spoke with Dr.Stolzfus and is ok with preceding without having a covid test before surgery.

## 2020-01-30 NOTE — Anesthesia Procedure Notes (Signed)
Procedure Name: LMA Insertion Date/Time: 01/30/2020 7:45 PM Performed by: Claudina Lick, CRNA Pre-anesthesia Checklist: Patient identified, Emergency Drugs available, Suction available, Patient being monitored and Timeout performed Patient Re-evaluated:Patient Re-evaluated prior to induction Oxygen Delivery Method: Circle system utilized Preoxygenation: Pre-oxygenation with 100% oxygen Induction Type: IV induction Ventilation: Mask ventilation without difficulty LMA: LMA inserted LMA Size: 4.0 Number of attempts: 1 Tube secured with: Tape Dental Injury: Teeth and Oropharynx as per pre-operative assessment

## 2020-01-30 NOTE — H&P (Signed)
History and Physical    Ryan Mcbride LGX:[id number] DOB: Mar 15, 1955 DOA: 01/29/2020  PCP: Patient, [name] Patient coming from: Home  Chief Complaint: Infection of right fifth digit  HPI: Ryan Mcbride is a 64 y.o. 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  Patient was recently admitted to the hospital on 01/18/2020 after he fell and injured his right hand at home, lacerating the fifth digit.  Found to have open right fifth digit PIP dislocation with volar fracture of the middle phalanx and secondary infection.  He was taken to the OR for reduction of the PIP joint and treatment of the associated fracture as well as irrigation and debridement by Dr. Roney Mans on 01/18/2020.  Wound culture grew staph aureus, group A strep, and few Prevotella melaninogenica.  He was treated with IV vancomycin and Flagyl during his hospitalization and discharged on 01/22/2020 with a 10-day course of Augmentin. Patient was seen by Dr. Roney Mans at the surgery clinic yesterday and noted to have pus draining from the wound and necrotic tissue present.  He was sent to the hospital as a direct admission with plan to take him to the OR in the morning. Patient reports noticing some pus drainage from his right fifth digit. Reports mild pain in this area. Denies fevers or chills. States he has been taking insulin and all the "pills" prescribed to him. Although he is not sure which medications he exactly takes. No other complaints. Denies cough, shortness of breath, chest pain, nausea, vomiting, abdominal pain, or diarrhea.  Review of Systems:  All systems reviewed and apart from history of presenting illness, are negative.  History reviewed.  No pertinent past medical history.  Past Surgical History:  Procedure Laterality Date  . amputation left leg    . I & D EXTREMITY Right 01/18/2020   Procedure: IRRIGATION AND DEBRIDEMENT SMALL FINGER  AND OPEN FRACTURE,,OPEN REDUCTION OF RIGHT PIP JOINT;  Surgeon: Ernest Mallick, MD;  Location: [facility] OR;  Service: Orthopedics;  Laterality: Right;     reports that he has never smoked. He has never used smokeless tobacco. He reports current alcohol use. He reports that he does not use drugs.  No Known Allergies  History reviewed. No pertinent family history.  Prior to Admission medications   Medication Sig Start Date End Date Taking? Authorizing Provider  amoxicillin-clavulanate (AUGMENTIN) 875-125 MG tablet Take 1 tablet by mouth every 12 (twelve) hours for 10 days. 01/22/20 02/01/20  Regalado, Jon Billings A, MD  folic acid (FOLVITE) 1 MG tablet Take 1 tablet (1 mg total) by mouth daily. 01/22/20   Regalado, Belkys A, MD  insulin aspart protamine- aspart (NOVOLOG MIX 70/30) (70-30) 100 UNIT/ML injection Inject 0.14 mLs (14 Units total) into the skin 2 (two) times daily with a meal. 01/22/20   Regalado, Belkys A, MD  Insulin Syringe 27G X 1/2" 1 ML MISC 1 application by Does not apply route 2 (two) times daily. 01/22/20   Regalado, Belkys A, MD  lisinopril (ZESTRIL) 20 MG tablet Take 1 tablet (20 mg total) by mouth daily. 01/22/20   Regalado, Belkys A, MD  metFORMIN (GLUCOPHAGE) 1000 MG tablet Take 1 tablet (1,000 mg total) by mouth 2 (two) times daily with a meal. 01/22/20   Regalado, Belkys A,  MD  Multiple Vitamin (MULTIVITAMIN WITH MINERALS) TABS tablet Take 1 tablet by mouth daily. 01/22/20   Regalado, Belkys A, MD  oxyCODONE-acetaminophen (PERCOCET/ROXICET) 5-325 MG tablet Take 1 tablet by mouth every 4 (four) hours as needed for moderate pain. 01/22/20   Regalado, Belkys A, MD  polyethylene glycol (MIRALAX / GLYCOLAX) 17 g packet Take 17 g by mouth daily as needed for mild constipation.  01/22/20   Regalado, Belkys A, MD  thiamine 100 MG tablet Take 1 tablet (100 mg total) by mouth daily. 01/23/20   Alba Cory, MD    Physical Exam: Vitals:   01/29/20 2120 01/29/20 2334 01/30/20 0330  BP: (!) 155/93 135/87 (!) 94/57  Pulse: 77 69 69  Resp: 18 18 18   Temp: 98.7 F (37.1 C) 98.7 F (37.1 C) 98.2 F (36.8 C)  TempSrc: Oral Oral Oral  SpO2: 95% 95% 100%  Weight: 68 kg      Physical Exam Constitutional:      General: He is not in acute distress. HENT:     Head: Normocephalic and atraumatic.  Eyes:     Extraocular Movements: Extraocular movements intact.     Conjunctiva/sclera: Conjunctivae normal.  Cardiovascular:     Rate and Rhythm: Normal rate and regular rhythm.     Pulses: Normal pulses.  Pulmonary:     Effort: Pulmonary effort is normal. No respiratory distress.     Breath sounds: Normal breath sounds. No wheezing or rales.  Abdominal:     General: Bowel sounds are normal. There is no distension.     Palpations: Abdomen is soft.     Tenderness: There is no abdominal tenderness.  Musculoskeletal:        General: No swelling or tenderness.     Cervical back: Normal range of motion and neck supple.  Skin:    General: Skin is warm and dry.  Neurological:     General: No focal deficit present.     Mental Status: He is alert and oriented to person, place, and time.     Labs on Admission: I have personally reviewed following labs and imaging studies  CBC: No results for input(s): WBC, NEUTROABS, HGB, HCT, MCV, PLT in the last 168 hours. Basic Metabolic Panel: No results for input(s): NA, K, CL, CO2, GLUCOSE, BUN, CREATININE, CALCIUM, MG, PHOS in the last 168 hours. GFR: Estimated Creatinine Clearance: 74.6 mL/min (by C-G formula based on SCr of 0.87 mg/dL). Liver Function Tests: No results for input(s): AST, ALT, ALKPHOS, BILITOT, PROT, ALBUMIN in the last 168 hours. No results for input(s): LIPASE, AMYLASE in the last 168 hours. No results  for input(s): AMMONIA in the last 168 hours. Coagulation Profile: No results for input(s): INR, PROTIME in the last 168 hours. Cardiac Enzymes: No results for input(s): CKTOTAL, CKMB, CKMBINDEX, TROPONINI in the last 168 hours. BNP (last 3 results) No results for input(s): PROBNP in the last 8760 hours. HbA1C: No results for input(s): HGBA1C in the last 72 hours. CBG: No results for input(s): GLUCAP in the last 168 hours. Lipid Profile: No results for input(s): CHOL, HDL, LDLCALC, TRIG, CHOLHDL, LDLDIRECT in the last 72 hours. Thyroid Function Tests: No results for input(s): TSH, T4TOTAL, FREET4, T3FREE, THYROIDAB in the last 72 hours. Anemia Panel: No results for input(s): VITAMINB12, FOLATE, FERRITIN, TIBC, IRON, RETICCTPCT in the last 72 hours. Urine analysis:    Component Value Date/Time   COLORURINE STRAW (A) 06/18/2017 2333   APPEARANCEUR CLEAR 06/18/2017 2333   LABSPEC 1.026  06/18/2017 2333   PHURINE 6.0 06/18/2017 2333   GLUCOSEU >=500 (A) 06/18/2017 2333   HGBUR NEGATIVE 06/18/2017 2333   BILIRUBINUR NEGATIVE 06/18/2017 2333   KETONESUR 5 (A) 06/18/2017 2333   PROTEINUR NEGATIVE 06/18/2017 2333   NITRITE NEGATIVE 06/18/2017 2333   LEUKOCYTESUR NEGATIVE 06/18/2017 2333    Radiological Exams on Admission: No results found.  EKG: Ordered and pending at this time.  Assessment/Plan Principal Problem:   Wound infection Active Problems:   Essential hypertension   Alcohol abuse   Type 2 diabetes mellitus (HCC)   Wound infection of the right fifth digit Patient was recently admitted to the hospital on 01/18/2020 after he fell and injured his right hand at home, lacerating the fifth digit.  Found to have open right fifth digit PIP dislocation with volar fracture of the middle phalanx and secondary infection.  He was taken to the OR for reduction of the PIP joint and treatment of the associated fracture as well as irrigation and debridement by Dr. Roney Mans on 01/18/2020.   Wound culture grew staph aureus, group A strep, and few Prevotella melaninogenica.  He was treated with IV vancomycin and Flagyl during his hospitalization and discharged on 01/22/2020 with a 10-day course of Augmentin. Patient was seen by Dr. Roney Mans at the surgery clinic yesterday and noted to have pus draining from the wound and necrotic tissue present.  He was sent to the hospital as a direct admission with plan to take him to the OR in the morning. No fever, leukocytosis, or signs of sepsis. -Will hold off starting antibiotics until wound cultures are obtained in the OR. Check lactate level. Order x-ray of right hand.  Poorly controlled type 2 diabetes A1c checked on 01/19/2020 was 11.8.  Per discharge summary from recent hospitalization, patient was not taking any medications for his diabetes as an outpatient.  Metformin was resumed and he was started on insulin during this hospitalization. -Hold Metformin.  Order sliding scale insulin every 4 hours as patient is currently n.p.o.  Resume home basal insulin after pharmacy med rec is done.  Alcohol abuse No signs of withdrawal at this time. -CIWA protocol; Ativan as needed.  Folate, thiamine, and multivitamin.  Hypertension Stable.  Currently normotensive. -Resume home med after pharmacy med rec is done.  DVT prophylaxis: SCDs at this time Code Status: Full code Family Communication: No family available at this time. Disposition Plan: Status is: Inpatient  Remains inpatient appropriate because:IV treatments appropriate due to intensity of illness or inability to take PO, Inpatient level of care appropriate due to severity of illness and Needs surgery   Dispo: The patient is from: Home              Anticipated d/c is to: Home              Anticipated d/c date is: 3 days              Patient currently is not medically stable to d/c.  The medical decision making on this patient was of high complexity and the patient is at high risk  for clinical deterioration, therefore this is a level 3 visit.  John Giovanni MD [facility]  If 7PM-7AM, please contact night-coverage [email or web address]  01/30/2020, 3:49 AM

## 2020-01-30 NOTE — Anesthesia Preprocedure Evaluation (Addendum)
Anesthesia Evaluation  Patient identified by MRN, date of birth, ID band Patient awake    Reviewed: Allergy & Precautions, NPO status , Patient's Chart, lab work & pertinent test results  History of Anesthesia Complications Negative for: history of anesthetic complications  Airway Mallampati: I  TM Distance: >3 FB Neck ROM: Full    Dental  (+) Poor Dentition, Missing, Dental Advisory Given   Pulmonary neg pulmonary ROS,  01/18/2020 SARS coronavirus NEG   breath sounds clear to auscultation       Cardiovascular hypertension, Pt. on medications  Rhythm:Regular Rate:Normal     Neuro/Psych negative neurological ROS  negative psych ROS   GI/Hepatic negative GI ROS, (+)     substance abuse  alcohol use,   Endo/Other  diabetes, Poorly Controlled, Type 2, Oral Hypoglycemic Agents, Insulin Dependent  Renal/GU negative Renal ROS     Musculoskeletal Osteomyelitis right hand   Abdominal (+)  Abdomen: soft.    Peds  Hematology negative hematology ROS (+)   Anesthesia Other Findings   Reproductive/Obstetrics                             Anesthesia Physical  Anesthesia Plan  ASA: III  Anesthesia Plan: General   Post-op Pain Management:    Induction: Intravenous  PONV Risk Score and Plan: 2 and Ondansetron, Dexamethasone and Treatment may vary due to age or medical condition  Airway Management Planned: LMA and Mask  Additional Equipment: None  Intra-op Plan:   Post-operative Plan:   Informed Consent: I have reviewed the patients History and Physical, chart, labs and discussed the procedure including the risks, benefits and alternatives for the proposed anesthesia with the patient or authorized representative who has indicated his/her understanding and acceptance.     Dental advisory given  Plan Discussed with: CRNA and Surgeon  Anesthesia Plan Comments: (Lab Results       Component                Value               Date                      WBC                      7.4                 01/30/2020                HGB                      13.6                01/30/2020                HCT                      38.4 (L)            01/30/2020                MCV                      87.3                01/30/2020  PLT                      345                 01/30/2020           Lab Results      Component                Value               Date                      NA                       133 (L)             01/30/2020                K                        4.9                 01/30/2020                CO2                      24                  01/30/2020                GLUCOSE                  191 (H)             01/30/2020                BUN                      10                  01/30/2020                CREATININE               0.81                01/30/2020                CALCIUM                  9.1                 01/30/2020                GFRNONAA                 >60                 01/30/2020                GFRAA                    >60                 06/22/2017          )        Anesthesia Quick Evaluation

## 2020-01-30 NOTE — Op Note (Addendum)
PREOPERATIVE DIAGNOSIS: Necrotic and severely infected right small finger with previous open PIP fracture dislocation  POSTOPERATIVE DIAGNOSIS: Same  ATTENDING PHYSICIAN: Gasper Lloyd. Roney Mans, III, MD who was present and scrubbed for the entire case   ASSISTANT SURGEON: None.   ANESTHESIA: General  SURGICAL PROCEDURES: 1.  Amputation of the right small finger through the MP joint with irrigation debridement extending into the distal palm 2. Application of negative pressure wound vac to the right hand  SURGICAL INDICATIONS: Patient is a 64 year old male who about 10 days ago had a fracture dislocation which was open of the right small finger.  This went untreated for several days until he presented to the ER with a gross infection of his small finger open PIP fracture dislocation.  He underwent initial irrigation debridement as well as joint reduction by myself about 10 days ago.  He presented in clinic yesterday with necrotic finger abundant purulent drainage from the.  I did recommend admission to the hospitalist service and a repeat irrigation debridement of the finger as well as amputation.  He presents today for that.  FINDING: Necrotic finger distal to the PIP joint.  Abundant, foul swelling purulence within the finger proximally extending into the palm volar to the MP joint.  DESCRIPTION OF PROCEDURE: Patient was identified in the preoperative holding area where the risk benefits and alternatives of the procedure were again discussed with the patient.  These risks include but are not limited to infection, bleeding, damage to surrounding structures including blood vessels and nerves, pain, stiffness and need for additional procedures.  Informed consent was obtained at that time the patient's right hand was marked.  He was then brought back to the operative suite where timeout was performed identifying the correct patient operative site.  He was positioned supine on the operative table and  induced under general anesthesia.  Tourniquet was placed on the upper arm right upper extremity was then prepped and draped using a Betadine scrub and usual sterile fashion.  The limb was exsanguinated by gravity and the tourniquet was inflated.  There was necrosis of the right small finger distal to the PIP joint.  15 blade was used to incise through the finger through the PIP joint remove the necrotic digit.  In doing so there was a abundance of purulent material within the palm around the proximal phalanx.  This was cultured again for both aerobic and anaerobic bacteria.  There was significant purulent fluid and material throughout the palmar and dorsal aspect of the proximal phalanx so sharp dissection was performed through the proximal phalanx down to the MP joint.  The MP joint was incised and the proximal phalanx was removed.  Thorough debridement through the remaining portion of the wound was performed attempting to remove all of the necrotic and infected material.  This included both the flexor and extensor tendons.  The radial and ulnar neurovascular bundles were found and cauterized.  At this point the wound was copiously irrigated with 3 L normal saline via cystoscopy tubing.  Due to the amount of gross purulence, wound VAC was applied and the wound was left open to allow for continued drainage.  The tourniquet was released and the patient had return of brisk capillary refill to his remaining digits.  He was then awoken from his anesthesia and x-rayed in the operating without a complications.  He is taken the PACU in stable condition.  He tolerated the procedure well and there are no complications.  ESTIMATED BLOOD LOSS: 10 mL  TOURNIQUET TIME: Less than 1/2-hour  SPECIMENS: Aerobic and anaerobic cultures  POSTOPERATIVE PLAN: Patient will be transferred back up to the hospital floor for continued inpatient care under the hospitalist service.  We will plan on repeat I&D of the hand and hopeful  wound closure later this week.  He is to remain inpatient until then.  We will continue to follow-up his antibiotics and recommend a infectious disease consult for continued antibiotic treatment.  IMPLANTS: None

## 2020-01-31 ENCOUNTER — Encounter (HOSPITAL_COMMUNITY): Payer: Self-pay | Admitting: Orthopaedic Surgery

## 2020-01-31 ENCOUNTER — Inpatient Hospital Stay: Payer: Self-pay

## 2020-01-31 DIAGNOSIS — M86141 Other acute osteomyelitis, right hand: Secondary | ICD-10-CM

## 2020-01-31 DIAGNOSIS — Z794 Long term (current) use of insulin: Secondary | ICD-10-CM

## 2020-01-31 DIAGNOSIS — T8742 Infection of amputation stump, left upper extremity: Secondary | ICD-10-CM

## 2020-01-31 DIAGNOSIS — Z89421 Acquired absence of other right toe(s): Secondary | ICD-10-CM

## 2020-01-31 DIAGNOSIS — R509 Fever, unspecified: Secondary | ICD-10-CM

## 2020-01-31 DIAGNOSIS — F1010 Alcohol abuse, uncomplicated: Secondary | ICD-10-CM

## 2020-01-31 DIAGNOSIS — E11628 Type 2 diabetes mellitus with other skin complications: Secondary | ICD-10-CM

## 2020-01-31 DIAGNOSIS — I10 Essential (primary) hypertension: Secondary | ICD-10-CM

## 2020-01-31 DIAGNOSIS — Z89512 Acquired absence of left leg below knee: Secondary | ICD-10-CM

## 2020-01-31 DIAGNOSIS — B9689 Other specified bacterial agents as the cause of diseases classified elsewhere: Secondary | ICD-10-CM

## 2020-01-31 LAB — BASIC METABOLIC PANEL
Anion gap: 11 (ref 5–15)
BUN: 9 mg/dL (ref 8–23)
CO2: 23 mmol/L (ref 22–32)
Calcium: 8.6 mg/dL — ABNORMAL LOW (ref 8.9–10.3)
Chloride: 100 mmol/L (ref 98–111)
Creatinine, Ser: 0.98 mg/dL (ref 0.61–1.24)
GFR, Estimated: >60 mL/min (ref >60)
Glucose, Bld: 238 mg/dL — ABNORMAL HIGH (ref 70–99)
Potassium: 4.6 mmol/L (ref 3.5–5.1)
Sodium: 134 mmol/L — ABNORMAL LOW (ref 135–145)

## 2020-01-31 LAB — CBC
HCT: 38.8 % — ABNORMAL LOW (ref 39.0–52.0)
Hemoglobin: 13.1 g/dL (ref 13.0–17.0)
MCH: 30.1 pg (ref 26.0–34.0)
MCHC: 33.8 g/dL (ref 30.0–36.0)
MCV: 89.2 fL (ref 80.0–100.0)
Platelets: 349 10*3/uL (ref 150–400)
RBC: 4.35 MIL/uL (ref 4.22–5.81)
RDW: 11.5 % (ref 11.5–15.5)
WBC: 8.8 10*3/uL (ref 4.0–10.5)
nRBC: 0 % (ref 0.0–0.2)

## 2020-01-31 LAB — GLUCOSE, CAPILLARY
Glucose-Capillary: 202 mg/dL — ABNORMAL HIGH (ref 70–99)
Glucose-Capillary: 228 mg/dL — ABNORMAL HIGH (ref 70–99)
Glucose-Capillary: 233 mg/dL — ABNORMAL HIGH (ref 70–99)
Glucose-Capillary: 154 mg/dL — ABNORMAL HIGH (ref 70–99)
Glucose-Capillary: 173 mg/dL — ABNORMAL HIGH (ref 70–99)

## 2020-01-31 LAB — MAGNESIUM: Magnesium: 1.6 mg/dL — ABNORMAL LOW (ref 1.7–2.4)

## 2020-01-31 MED ORDER — SODIUM CHLORIDE 0.9 % IV SOLN
1.0000 g | INTRAVENOUS | Status: DC
  Administered 2020-01-31: 1000 mg via INTRAVENOUS
  Filled 2020-01-31: qty 1

## 2020-01-31 MED ORDER — LISINOPRIL 20 MG PO TABS
20.0000 mg | ORAL_TABLET | Freq: Every day | ORAL | Status: DC
  Administered 2020-01-31 – 2020-02-02 (×3): 20 mg via ORAL
  Filled 2020-01-31 (×3): qty 1

## 2020-01-31 MED ORDER — INSULIN ASPART PROT & ASPART (70-30 MIX) 100 UNIT/ML ~~LOC~~ SUSP
10.0000 [IU] | Freq: Two times a day (BID) | SUBCUTANEOUS | Status: DC
  Administered 2020-01-31: 10 [IU] via SUBCUTANEOUS
  Filled 2020-01-31: qty 10

## 2020-01-31 MED ORDER — HYDRALAZINE HCL 25 MG PO TABS: 25.0000 mg | ORAL_TABLET | Freq: Four times a day (QID) | ORAL | Status: DC | PRN

## 2020-01-31 MED ORDER — SODIUM CHLORIDE 0.9 % IV SOLN
2.0000 g | Freq: Three times a day (TID) | INTRAVENOUS | Status: DC
  Filled 2020-01-31 (×3): qty 2

## 2020-01-31 MED ORDER — PIPERACILLIN-TAZOBACTAM 3.375 G IVPB
3.3750 g | Freq: Three times a day (TID) | INTRAVENOUS | Status: DC
  Administered 2020-01-31 – 2020-02-02 (×6): 3.375 g via INTRAVENOUS
  Filled 2020-01-31 (×8): qty 50

## 2020-01-31 NOTE — Progress Notes (Signed)
Received order for PICC  

## 2020-01-31 NOTE — Progress Notes (Signed)
   Ortho Hand Progress Note  Subjective: No acute events last night. Pain in right hand controlled   Objective: Vital signs in last 24 hours: Temp:  [97.5 F (36.4 C)-98.4 F (36.9 C)] 97.5 F (36.4 C) (12/09 0522) Pulse Rate:  [62-81] 70 (12/09 0522) Resp:  [12-25] 18 (12/09 0522) BP: (122-171)/(74-98) 171/91 (12/09 0522) SpO2:  [97 %-100 %] 97 % (12/09 0522) Weight:  [68 kg] 68 kg (12/08 1849)  Intake/Output from previous day: 12/08 0701 - 12/09 0700 In: 1633.9 [P.O.:120; I.V.:1213.9; IV Piggyback:250] Out: 1315 [Urine:1300; Blood:15] Intake/Output this shift: No intake/output data recorded.  Recent Labs    01/30/20 0349 01/31/20 0246  HGB 13.6 13.1   Recent Labs    01/30/20 0349 01/31/20 0246  WBC 7.4 8.8  RBC 4.40 4.35  HCT 38.4* 38.8*  PLT 345 349   Recent Labs    01/30/20 0349 01/31/20 0246  NA 133* 134*  K 4.9 4.6  CL 100 100  CO2 24 23  BUN 10 9  CREATININE 0.81 0.98  GLUCOSE 191* 238*  CALCIUM 9.1 8.6*   No results for input(s): LABPT, INR in the last 72 hours.  Aaox3 nad Resp nonlabored RRR RUE: Dressings in place. Wound vac with serosang output. Good seal. Intact motor and sensation to the remaining digits. Fingers all wwp with bcr.   Assessment/Plan: Necrotic and severely infected right small finger s/p MP joint disarticulation and I&D, wound vac placement. DOS 12/8  - Medicine primary. Appreciate management - Restarted on vanco post op. Cefepime started today for abundant GNRs on gram stain - Recommend ID consult - new cxs pending from OR yesterday - Gram stain with GPCs and GNRs - Continue wound vac and current dressings - Plan for repeat I&D with attempted wound closure tomorrow afternoon - NPO at midnight. - Remainder per primary   Cain Saupe III 01/31/2020, 8:26 AM  [phone]

## 2020-01-31 NOTE — Progress Notes (Signed)
ID Pharmacy Note  Noted new culture growth of enterococcus faecalis and E Coli on OR culture from 12/8. Spoke with Dr. Daiva Eves and will change ertapenem to Zosyn 3.375 gm every 8 hours.   Sharin Mons, PharmD, BCPS, BCIDP Infectious Diseases Clinical Pharmacist Phone: [phone] 01/31/2020 4:12 PM

## 2020-01-31 NOTE — Consult Note (Signed)
Date of Admission:  01/29/2020          Reason for Consult: Osteomyelitis of fifth finger status post amputation    Referring Provider: Dr. [place]   Assessment:  1. 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 Poorly controlled diabetes mellitus 3. Alcoholism 4. 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 Consolidate to ertapenem 1 g daily 2. Follow-up intraoperative cultures 3. Place PICC   Plan on next weeks of parenteral therapy will need charity care engaged.  Principal Problem:   Wound infection Active Problems:   Essential hypertension   Alcohol abuse   Type 2 diabetes mellitus (HCC)   Scheduled Meds: . folic acid  1 mg Oral Daily  . insulin aspart  0-9 Units Subcutaneous Q4H  . insulin aspart protamine- aspart  10 Units Subcutaneous BID WC  . lisinopril  20 mg Oral Daily  . multivitamin with minerals  1 tablet Oral Daily  . povidone-iodine  2 application Topical Once  . thiamine  100 mg Oral Daily   Continuous Infusions: . ertapenem     PRN Meds:.hydrALAZINE, HYDROcodone-acetaminophen, LORazepam **OR** LORazepam, morphine injection  HPI: Ryan Mcbride is a 64 y.o. male with poorly controlled diabetes and alcoholism who is undergone multiple multiple amputations including left below the knee amputation amputation of 2 digits on his right foot who apparently fell while inebriated and walking on his prosthesis.  He struck his hand and developed infection and was brought to the hospital  Was found to have an open fifth digit proximal interphalangeal dislocation with a volar fracture of the middle phalanx with infection.  He is taken the operating room for reduction of PIP joint and treatment of the fracture with I&D on January 18, 2020 wound cultures grew methicillin sensitive Staph aureus group A streptococcus and Prevotella species.   He was treated with Iva V vancomycin and metronidazole and then discharged on Augmentin.  He was seen for follow-up by Dr. Roney [facility] in [facility] and found to have pus draining from the wound with necrotic tissue.  He was brought to the hospital for direct admission was taken to the operating room.  He underwent amputation of the right small finger through the MP joint with irrigation debridement extending into the distal palm with blood pressure of a wound vacuum.  Intraoperative Gram stain's of shown moderate gram-positive cocci in pairs and chains as well as abundant gram-negative rods cultures growing few gram-negative rods which are being reintubated.  We will consolidate him to ertapenem which would be nice once a day antibiotic that could be given at home.  His daughter who is in the room today when we visited him believes that she could help him with administration of the IV antibiotics I do have some anxiety about his alcoholism and whether this is going to make home IV antibiotic therapy problematic.    Review of Systems: Review of Systems  Constitutional: Positive for fever. Negative for chills, diaphoresis, malaise/fatigue and weight loss.  HENT: Negative for congestion, hearing loss, sore throat and tinnitus.   Eyes: Negative for blurred vision and double vision.  Respiratory: Negative for cough, sputum production, shortness of breath and wheezing.   Cardiovascular: Negative for chest pain, palpitations and leg swelling.  Gastrointestinal: Negative for abdominal pain, blood in stool, constipation, diarrhea, heartburn, melena, nausea and vomiting.  Genitourinary: Negative for dysuria,  flank pain and hematuria.  Musculoskeletal: Positive for falls and joint pain. Negative for back pain and myalgias.  Skin: Negative for itching and rash.  Neurological: Negative for dizziness, sensory change, focal weakness, loss of consciousness, weakness and headaches.   Endo/Heme/Allergies: Does not bruise/bleed easily.  Psychiatric/Behavioral: Positive for substance abuse. Negative for depression, memory loss and suicidal ideas. The patient is not nervous/anxious.     Past Medical History:  Diagnosis Date  . Alcohol abuse   . Diabetes mellitus without complication (HCC)   . Hypertension   . Osteomyelitis [facility])     Social History   Tobacco Use  . Smoking status: Never Smoker  . Smokeless tobacco: Never Used  Substance Use Topics  . Alcohol use: Yes  . Drug use: Never    History reviewed. No pertinent family history. No Known Allergies  OBJECTIVE: Blood pressure (!) 171/91, pulse 70, temperature (!) 97.5 F (36.4 C), temperature source Oral, resp. rate 18, height 5\' 5"  (1.651 m), weight 68 kg, SpO2 97 %.  Physical Exam Constitutional:      General: He is not in acute distress.    Appearance: Normal appearance. He is well-developed and well-nourished. He is not ill-appearing or diaphoretic.  HENT:     Head: Normocephalic and atraumatic.     Right Ear: Hearing and external ear normal.     Left Ear: Hearing and external ear normal.     Nose: No nasal deformity, rhinorrhea or epistaxis.  Eyes:     General: No scleral icterus.    Extraocular Movements: EOM normal.     Conjunctiva/sclera: Conjunctivae normal.     Right eye: Right conjunctiva is not injected.     Left eye: Left conjunctiva is not injected.  Neck:     Vascular: No JVD.  Cardiovascular:     Rate and Rhythm: Normal rate and regular rhythm.     Heart sounds: Normal heart sounds, S1 normal and S2 normal. No murmur heard. No friction rub.  Pulmonary:     Effort: Pulmonary effort is normal. No respiratory distress.     Breath sounds: No wheezing.  Abdominal:     General: Bowel sounds are normal. There is no distension or ascites.     Palpations: Abdomen is soft. There is no hepatosplenomegaly.     Tenderness: There is no abdominal tenderness.  Musculoskeletal:     Right  shoulder: Normal.     Left shoulder: Normal.     Cervical back: Normal range of motion and neck supple.     Right hip: Normal.     Left hip: Normal.     Right knee: Normal.     Left knee: Normal.  Lymphadenopathy:     Head:     Right side of head: No submandibular, preauricular or posterior auricular adenopathy.     Left side of head: No submandibular, preauricular or posterior auricular adenopathy.     Cervical: No cervical adenopathy.     Right cervical: No superficial or deep cervical adenopathy.    Left cervical: No superficial or deep cervical adenopathy.  Skin:    General: Skin is warm, dry and intact.     Coloration: Skin is not pale.     Findings: No abrasion, bruising, ecchymosis, erythema, lesion or rash.     Nails: There is no clubbing or cyanosis.  Neurological:     Mental Status: He is alert and oriented to person, place, and time.     Sensory: No sensory deficit.  Coordination: Coordination normal.     Gait: Gait normal.     Deep Tendon Reflexes: Strength normal.  Psychiatric:        Attention and Perception: He is attentive.        Mood and Affect: Mood and affect and mood normal.        Speech: Speech normal.        Behavior: Behavior normal. Behavior is cooperative.        Thought Content: Thought content normal.        Cognition and Memory: Cognition and memory normal.    Left BKA site is clean right foot with sites of amputation are clean no new ulcers  Right hand with bandage over amputation site   Lab Results Lab Results  Component Value Date   WBC 8.8 01/31/2020   HGB 13.1 01/31/2020   HCT 38.8 (L) 01/31/2020   MCV 89.2 01/31/2020   PLT 349 01/31/2020    Lab Results  Component Value Date   CREATININE 0.98 01/31/2020   BUN 9 01/31/2020   NA 134 (L) 01/31/2020   K 4.6 01/31/2020   CL 100 01/31/2020   CO2 23 01/31/2020    Lab Results  Component Value Date   ALT 17 01/30/2020   AST 19 01/30/2020   ALKPHOS 69 01/30/2020   BILITOT 0.7  01/30/2020     Microbiology: Recent Results (from the past 240 hour(s))  MRSA PCR Screening     Status: None   Collection Time: 01/30/20 12:51 AM  Result Value Ref Range Status   MRSA by PCR NEGATIVE NEGATIVE Final    Comment:        The GeneXpert MRSA Assay (FDA approved for NASAL specimens only), is one component of a comprehensive MRSA colonization surveillance program. It is not intended to diagnose MRSA infection nor to guide or monitor treatment for MRSA infections. Performed at [facility], [address], [place], [place] [address]   Aerobic/Anaerobic Culture (surgical/deep wound)     Status: None (Preliminary result)   Collection Time: 01/30/20  8:45 PM   Specimen: Soft Tissue, Other  Result Value Ref Range Status   Specimen Description WOUND  Final   Special Requests RIGHT 5TH AND PINKY FINGER  Final   Gram Stain   Final    RARE WBC PRESENT, PREDOMINANTLY MONONUCLEAR MODERATE GRAM POSITIVE COCCI IN PAIRS IN CHAINS ABUNDANT GRAM NEGATIVE RODS    Culture   Final    FEW GRAM NEGATIVE RODS IDENTIFICATION AND SUSCEPTIBILITIES TO FOLLOW CULTURE REINCUBATED FOR BETTER GROWTH Performed at [facility], [address], [place], [place] [address]    Report Status PENDING  Incomplete    Acey Lav, MD [facility] [phone] pager  01/31/2020, 11:28 AM

## 2020-01-31 NOTE — Progress Notes (Signed)
PROGRESS NOTE    Ryan Mcbride  ZSW:[id number] DOB: 04/30/1955 DOA: 01/29/2020 PCP: Patient, [name]    Brief Narrative:  Ryan Mcbride is a 64 year old Spanish-speaking male with past medical history significant for poorly controlled type 2 diabetes mellitus, essential hypertension, history of EtOH abuse, history of osteomyelitis status post left BKA and amputation of right fourth and fifth toes, medical noncompliance who presents from his orthopedic hand surgeon office for concern of hand infection.  Patient was noted to have pus draining from the wound with necrotic tissue present.  Dr. Roney Mans request direct admission for surgical intervention.   Recently admitted from 01/18/2020 for right fifth digit laceration and found to have an open right fifth digit PIP dislocation with volar fracture of the middle phalanx with secondary infection.  Patient underwent irrigation debridement by Dr. Roney Mans and treated with IV Vanco/Flagyl and discharged on Augmentin.   Assessment & Plan:   Principal Problem:   Wound infection Active Problems:   Essential hypertension   Alcohol abuse   Type 2 diabetes mellitus (HCC)   Right hand abscess, recurrent Patient recently hospitalized following fall with laceration of the right fifth digit with open right fifth digit PIP dislocation with volar fracture and secondary infection.  Underwent irrigation debridement and discharged home on oral antibiotics.  Now representing with purulent drainage from wound with necrotic tissue.  X-ray right hand with no acute findings, remote distal radius fracture/boxer's fracture.  Underwent wound and debridement with amputation right small finger and wound VAC placement by Dr. Roney Mans on 01/30/2020. --Dr. Roney Mans plans repeat irrigation and debridement with possible wound closure on 02/01/2020 --Infectious disease following, appreciate assistance --Operative culture pending; Gram stain with abundant  GPC/GNR's --Ertapenem 1 g daily --PICC line for plan for outpatient IV antibiotics --Social worker aware of the need for likely charity IV antibiotics at time of discharge --N.p.o. after midnight  Type 2 diabetes mellitus Hemoglobin A1c 11.8 on 01/19/2020, poorly controlled.  Home regimen includes Metformin 1000 mg p.o. twice daily, NPH 70/30 14 units twice daily. --Restart NPH 70/30 10 units BID; will hold evening dose for planned surgical intervention tomorrow --Insulin sliding scale for coverage --CBGs every 4 hours  EtOH use disorder Discussed with patient need for complete EtOH cessation. --CIWAA protocol with symptom triggered Ativan --Folate, thiamine, multivitamin  Essential hypertension On lisinopril 20 mg p.o. daily outpatient. --Restart lisinopril 20 mg p.o. daily --Hydralazine 25 mg p.o. every 6 hours as needed for SBP >170 or DBP >110 --Continue to hold home antihypertensives for now --Continue monitor BP closely   DVT prophylaxis: SCDs Code Status: Full code Family Communication: No family present at bedside this morning  Disposition Plan:  Status is: Inpatient  Remains inpatient appropriate because:Ongoing active pain requiring inpatient pain management, Ongoing diagnostic testing needed not appropriate for outpatient work up, Unsafe d/c plan, IV treatments appropriate due to intensity of illness or inability to take PO and Inpatient level of care appropriate due to severity of illness   Dispo: The patient is from: Home              Anticipated d/c is to: Home              Anticipated d/c date is: 3 days              Patient currently is not medically stable to d/c.    Consultants:   Orthopedic hand surgery, Dr. Roney Mans  Infectious disease, Dr. Daiva Eves  Procedures:   None  Antimicrobials:   Vancomycin 12/7 - 12/9  Cefepime 12/9 -12/9  Ertapenem 12/9>>   Subjective: Patient seen and examined bedside, resting comfortably.  Assisted with  Spanish video interpreter, Ryan Mcbride.  Pain fairly well controlled with wound VAC in place.  Orthopedics plans for further irrigation debridement and possible wound closure tomorrow.  Discussed with ID, Dr. Daiva Eves will see patient today.  Patient with no other complaints or concerns at this time. Denies headache, no chest pain, no palpitations, no shortness of breath, no abdominal pain, no fever/chills/night sweats, no nausea/vomiting/diarrhea.  No acute events overnight per nursing staff.  Objective: Vitals:   01/30/20 2100 01/30/20 2115 01/30/20 2359 01/31/20 0522  BP: (!) 146/81 136/80 (!) 160/98 (!) 171/91  Pulse: 75 69 68 70  Resp: (!) 25 20 18 18   Temp:  98.1 F (36.7 C) 97.6 F (36.4 C) (!) 97.5 F (36.4 C)  TempSrc:   Oral Oral  SpO2: 98% 98% 100% 97%  Weight:      Height:        Intake/Output Summary (Last 24 hours) at 01/31/2020 1144 Last data filed at 01/31/2020 1056 Gross per 24 hour  Intake 931.1 ml  Output 940 ml  Net -8.9 ml   Filed Weights   01/29/20 2120 01/30/20 1849  Weight: 68 kg 68 kg    Examination:  General exam: Appears calm and comfortable  Respiratory system: Clear to auscultation. Respiratory effort normal. Cardiovascular system: S1 & S2 heard, RRR. No JVD, murmurs, rubs, gallops or clicks. No pedal edema. Gastrointestinal system: Abdomen is nondistended, soft and nontender. No organomegaly or masses felt. Normal bowel sounds heard. Central nervous system: Alert and oriented. No focal neurological deficits. Extremities: Symmetric 5 x 5 power.  Noted left BKA Skin: Right hand with dressing/wound VAC in place, draining serosanguineous fluid Psychiatry: Judgement and insight appear normal. Mood & affect appropriate.     Data Reviewed: I have personally reviewed following labs and imaging studies  CBC: Recent Labs  Lab 01/30/20 0349 01/31/20 0246  WBC 7.4 8.8  HGB 13.6 13.1  HCT 38.4* 38.8*  MCV 87.3 89.2  PLT 345 349   Basic Metabolic  Panel: Recent Labs  Lab 01/30/20 0349 01/31/20 0246  NA 133* 134*  K 4.9 4.6  CL 100 100  CO2 24 23  GLUCOSE 191* 238*  BUN 10 9  CREATININE 0.81 0.98  CALCIUM 9.1 8.6*  MG 1.8 1.6*  PHOS 3.6  --    GFR: Estimated Creatinine Clearance: 66.2 mL/min (by C-G formula based on SCr of 0.98 mg/dL). Liver Function Tests: Recent Labs  Lab 01/30/20 0349  AST 19  ALT 17  ALKPHOS 69  BILITOT 0.7  PROT 6.6  ALBUMIN 3.0*   No results for input(s): LIPASE, AMYLASE in the last 168 hours. No results for input(s): AMMONIA in the last 168 hours. Coagulation Profile: No results for input(s): INR, PROTIME in the last 168 hours. Cardiac Enzymes: No results for input(s): CKTOTAL, CKMB, CKMBINDEX, TROPONINI in the last 168 hours. BNP (last 3 results) No results for input(s): PROBNP in the last 8760 hours. HbA1C: No results for input(s): HGBA1C in the last 72 hours. CBG: Recent Labs  Lab 01/30/20 2031 01/30/20 2237 01/31/20 0525 01/31/20 0800 01/31/20 1126  GLUCAP 174* 166* 202* 228* 233*   Lipid Profile: No results for input(s): CHOL, HDL, LDLCALC, TRIG, CHOLHDL, LDLDIRECT in the last 72 hours. Thyroid Function Tests: No results for input(s): TSH, T4TOTAL, FREET4, T3FREE, THYROIDAB in the last 72  hours. Anemia Panel: No results for input(s): VITAMINB12, FOLATE, FERRITIN, TIBC, IRON, RETICCTPCT in the last 72 hours. Sepsis Labs: Recent Labs  Lab 01/30/20 0456  LATICACIDVEN 1.3    Recent Results (from the past 240 hour(s))  MRSA PCR Screening     Status: None   Collection Time: 01/30/20 12:51 AM  Result Value Ref Range Status   MRSA by PCR NEGATIVE NEGATIVE Final    Comment:        The GeneXpert MRSA Assay (FDA approved for NASAL specimens only), is one component of a comprehensive MRSA colonization surveillance program. It is not intended to diagnose MRSA infection nor to guide or monitor treatment for MRSA infections. Performed at [facility], [address], [place], [place] [address]   Aerobic/Anaerobic Culture (surgical/deep wound)     Status: None (Preliminary result)   Collection Time: 01/30/20  8:45 PM   Specimen: Soft Tissue, Other  Result Value Ref Range Status   Specimen Description WOUND  Final   Special Requests RIGHT 5TH AND PINKY FINGER  Final   Gram Stain   Final    RARE WBC PRESENT, PREDOMINANTLY MONONUCLEAR MODERATE GRAM POSITIVE COCCI IN PAIRS IN CHAINS ABUNDANT GRAM NEGATIVE RODS    Culture   Final    FEW GRAM NEGATIVE RODS IDENTIFICATION AND SUSCEPTIBILITIES TO FOLLOW CULTURE REINCUBATED FOR BETTER GROWTH Performed at [facility], [address], [place], [place] [address]    Report Status PENDING  Incomplete         Radiology Studies: DG Hand Complete Right  Result Date: 01/30/2020 CLINICAL DATA:  Wound infection. EXAM: RIGHT HAND - COMPLETE 3+ VIEW COMPARISON:  01/18/2020 FINDINGS: The fifth digit PIP joint remains located. No acute fracture. Remote distal radius fracture with settling and ulnar positive variance. Remote boxer's fracture with healed angulation. Interphalangeal and MCP osteoarthritis. Extensive arterial calcification. No evidence of osteomyelitis. IMPRESSION: 1. No acute finding. 2. Chronic findings are described above. Electronically Signed   By: Marnee Spring M.D.   On: 01/30/2020 06:35        Scheduled Meds: . folic acid  1 mg Oral Daily  . insulin aspart  0-9 Units Subcutaneous Q4H  . insulin aspart protamine- aspart  10 Units Subcutaneous BID WC  . lisinopril  20 mg Oral Daily  . multivitamin with minerals  1 tablet Oral Daily  . povidone-iodine  2 application Topical Once  . thiamine  100 mg Oral Daily   Continuous Infusions: . ertapenem       LOS: 2 days    Time spent: 39 minutes spent on chart review, discussion with nursing staff, consultants, updating family and interview/physical exam; more than 50% of that time was spent in counseling and/or coordination of  care.    Alvira Philips [place], DO [facility] Available via Epic secure chat 7am-7pm After these hours, please refer to coverage provider listed on [email or web address] 01/31/2020, 11:44 AM

## 2020-02-01 ENCOUNTER — Inpatient Hospital Stay (HOSPITAL_COMMUNITY): Payer: Self-pay | Admitting: Anesthesiology

## 2020-02-01 ENCOUNTER — Encounter (HOSPITAL_COMMUNITY)
Admission: AD | Disposition: A | Discharge status: Home / Self Care | Payer: Self-pay | Source: Ambulatory Visit | Attending: Internal Medicine

## 2020-02-01 ENCOUNTER — Encounter (HOSPITAL_COMMUNITY): Payer: Self-pay | Admitting: Internal Medicine

## 2020-02-01 DIAGNOSIS — M869 Osteomyelitis, unspecified: Secondary | ICD-10-CM | POA: Diagnosis present

## 2020-02-01 DIAGNOSIS — T8149XA Infection following a procedure, other surgical site, initial encounter: Secondary | ICD-10-CM

## 2020-02-01 DIAGNOSIS — B952 Enterococcus as the cause of diseases classified elsewhere: Secondary | ICD-10-CM

## 2020-02-01 DIAGNOSIS — B9620 Unspecified Escherichia coli [E. coli] as the cause of diseases classified elsewhere: Secondary | ICD-10-CM

## 2020-02-01 DIAGNOSIS — M868X4 Other osteomyelitis, hand: Secondary | ICD-10-CM

## 2020-02-01 HISTORY — PX: INCISION AND DRAINAGE OF WOUND: SHX1803

## 2020-02-01 LAB — BASIC METABOLIC PANEL
Anion gap: 8 (ref 5–15)
BUN: 8 mg/dL (ref 8–23)
CO2: 25 mmol/L (ref 22–32)
Calcium: 8.3 mg/dL — ABNORMAL LOW (ref 8.9–10.3)
Chloride: 100 mmol/L (ref 98–111)
Creatinine, Ser: 0.91 mg/dL (ref 0.61–1.24)
GFR, Estimated: >60 mL/min (ref >60)
Glucose, Bld: 165 mg/dL — ABNORMAL HIGH (ref 70–99)
Potassium: 3.6 mmol/L (ref 3.5–5.1)
Sodium: 133 mmol/L — ABNORMAL LOW (ref 135–145)

## 2020-02-01 LAB — HEPATITIS B SURFACE ANTIGEN: Hepatitis B Surface Ag: NONREACTIVE

## 2020-02-01 LAB — GLUCOSE, CAPILLARY
Glucose-Capillary: 128 mg/dL — ABNORMAL HIGH (ref 70–99)
Glucose-Capillary: 134 mg/dL — ABNORMAL HIGH (ref 70–99)
Glucose-Capillary: 149 mg/dL — ABNORMAL HIGH (ref 70–99)
Glucose-Capillary: 156 mg/dL — ABNORMAL HIGH (ref 70–99)
Glucose-Capillary: 160 mg/dL — ABNORMAL HIGH (ref 70–99)
Glucose-Capillary: 238 mg/dL — ABNORMAL HIGH (ref 70–99)
Glucose-Capillary: 90 mg/dL (ref 70–99)

## 2020-02-01 LAB — CBC
HCT: 36.4 % — ABNORMAL LOW (ref 39.0–52.0)
Hemoglobin: 13.2 g/dL (ref 13.0–17.0)
MCH: 31.2 pg (ref 26.0–34.0)
MCHC: 36.3 g/dL — ABNORMAL HIGH (ref 30.0–36.0)
MCV: 86.1 fL (ref 80.0–100.0)
Platelets: 372 10*3/uL (ref 150–400)
RBC: 4.23 MIL/uL (ref 4.22–5.81)
RDW: 11.6 % (ref 11.5–15.5)
WBC: 6.6 10*3/uL (ref 4.0–10.5)
nRBC: 0 % (ref 0.0–0.2)

## 2020-02-01 LAB — HEPATITIS C ANTIBODY: HCV Ab: NONREACTIVE

## 2020-02-01 LAB — SARS CORONAVIRUS 2 BY RT PCR (HOSPITAL ORDER, PERFORMED IN ~~LOC~~ HOSPITAL LAB): SARS Coronavirus 2: NEGATIVE

## 2020-02-01 SURGERY — IRRIGATION AND DEBRIDEMENT WOUND
Anesthesia: General | Site: Finger | Laterality: Right

## 2020-02-01 MED ORDER — EPHEDRINE SULFATE-NACL 50-0.9 MG/10ML-% IV SOSY
PREFILLED_SYRINGE | INTRAVENOUS | Status: DC | PRN
  Administered 2020-02-01 (×2): 10 mg via INTRAVENOUS

## 2020-02-01 MED ORDER — FENTANYL CITRATE (PF) 250 MCG/5ML IJ SOLN
INTRAMUSCULAR | Status: DC | PRN
  Administered 2020-02-01 (×2): 50 ug via INTRAVENOUS

## 2020-02-01 MED ORDER — MIDAZOLAM HCL 2 MG/2ML IJ SOLN
INTRAMUSCULAR | Status: AC
  Filled 2020-02-01: qty 2

## 2020-02-01 MED ORDER — LIDOCAINE 2% (20 MG/ML) 5 ML SYRINGE
INTRAMUSCULAR | Status: DC | PRN
  Administered 2020-02-01: 60 mg via INTRAVENOUS

## 2020-02-01 MED ORDER — LABETALOL HCL 5 MG/ML IV SOLN
INTRAVENOUS | Status: AC
  Filled 2020-02-01: qty 4

## 2020-02-01 MED ORDER — EPHEDRINE 5 MG/ML INJ
INTRAVENOUS | Status: AC
  Filled 2020-02-01: qty 10

## 2020-02-01 MED ORDER — ACETAMINOPHEN 10 MG/ML IV SOLN
INTRAVENOUS | Status: AC
  Filled 2020-02-01: qty 100

## 2020-02-01 MED ORDER — FENTANYL CITRATE (PF) 100 MCG/2ML IJ SOLN
25.0000 ug | INTRAMUSCULAR | Status: DC | PRN
  Administered 2020-02-01: 50 ug via INTRAVENOUS

## 2020-02-01 MED ORDER — PROPOFOL 10 MG/ML IV BOLUS
INTRAVENOUS | Status: AC
  Filled 2020-02-01: qty 40

## 2020-02-01 MED ORDER — FENTANYL CITRATE (PF) 250 MCG/5ML IJ SOLN
INTRAMUSCULAR | Status: AC
  Filled 2020-02-01: qty 5

## 2020-02-01 MED ORDER — HYDROCODONE-ACETAMINOPHEN 5-325 MG PO TABS: 1.0000 | ORAL_TABLET | Freq: Four times a day (QID) | ORAL | 0 refills | Status: DC | PRN

## 2020-02-01 MED ORDER — PROPOFOL 10 MG/ML IV BOLUS
INTRAVENOUS | Status: DC | PRN
  Administered 2020-02-01: 200 mg via INTRAVENOUS

## 2020-02-01 MED ORDER — CHLORHEXIDINE GLUCONATE CLOTH 2 % EX PADS
6.0000 | MEDICATED_PAD | Freq: Every day | CUTANEOUS | Status: DC
  Administered 2020-02-01 – 2020-02-02 (×2): 6 via TOPICAL

## 2020-02-01 MED ORDER — ONDANSETRON HCL 4 MG/2ML IJ SOLN
INTRAMUSCULAR | Status: AC
  Filled 2020-02-01: qty 2

## 2020-02-01 MED ORDER — BUPIVACAINE HCL (PF) 0.25 % IJ SOLN
INTRAMUSCULAR | Status: AC
  Filled 2020-02-01: qty 20

## 2020-02-01 MED ORDER — SODIUM CHLORIDE 0.9 % IR SOLN
Status: DC | PRN
  Administered 2020-02-01: 3000 mL

## 2020-02-01 MED ORDER — PHENYLEPHRINE HCL-NACL 10-0.9 MG/250ML-% IV SOLN
INTRAVENOUS | Status: DC | PRN
  Administered 2020-02-01: 40 ug/min via INTRAVENOUS

## 2020-02-01 MED ORDER — 0.9 % SODIUM CHLORIDE (POUR BTL) OPTIME
TOPICAL | Status: DC | PRN
  Administered 2020-02-01: 1000 mL

## 2020-02-01 MED ORDER — ACETAMINOPHEN 10 MG/ML IV SOLN
INTRAVENOUS | Status: DC | PRN
  Administered 2020-02-01: 1000 mg via INTRAVENOUS

## 2020-02-01 MED ORDER — MIDAZOLAM HCL 2 MG/2ML IJ SOLN
INTRAMUSCULAR | Status: DC | PRN
  Administered 2020-02-01: 2 mg via INTRAVENOUS

## 2020-02-01 MED ORDER — DEXAMETHASONE SODIUM PHOSPHATE 10 MG/ML IJ SOLN
INTRAMUSCULAR | Status: DC | PRN
  Administered 2020-02-01: 5 mg via INTRAVENOUS

## 2020-02-01 MED ORDER — DEXAMETHASONE SODIUM PHOSPHATE 10 MG/ML IJ SOLN
INTRAMUSCULAR | Status: AC
  Filled 2020-02-01: qty 1

## 2020-02-01 MED ORDER — CHLORHEXIDINE GLUCONATE 0.12 % MT SOLN: 15.0000 mL | Freq: Once | OROMUCOSAL | Status: DC

## 2020-02-01 MED ORDER — LABETALOL HCL 5 MG/ML IV SOLN
5.0000 mg | Freq: Once | INTRAVENOUS | Status: AC
  Administered 2020-02-01: 5 mg via INTRAVENOUS

## 2020-02-01 MED ORDER — PIPERACILLIN-TAZOBACTAM IV (FOR PTA / DISCHARGE USE ONLY): 13.5000 g | INTRAVENOUS | 0 refills | Status: DC

## 2020-02-01 MED ORDER — FENTANYL CITRATE (PF) 100 MCG/2ML IJ SOLN
INTRAMUSCULAR | Status: AC
  Filled 2020-02-01: qty 2

## 2020-02-01 MED ORDER — SODIUM CHLORIDE 0.9% FLUSH
10.0000 mL | INTRAVENOUS | Status: DC | PRN
Start: 2020-02-01 — End: 2020-02-02

## 2020-02-01 MED ORDER — LACTATED RINGERS IV SOLN: INTRAVENOUS | Status: DC

## 2020-02-01 MED ORDER — PIPERACILLIN-TAZOBACTAM IV (FOR PTA / DISCHARGE USE ONLY): 3.3750 g | Freq: Three times a day (TID) | INTRAVENOUS | 0 refills | Status: AC

## 2020-02-01 MED ORDER — ONDANSETRON HCL 4 MG/2ML IJ SOLN
INTRAMUSCULAR | Status: DC | PRN
  Administered 2020-02-01: 4 mg via INTRAVENOUS

## 2020-02-01 MED ORDER — PIPERACILLIN-TAZOBACTAM IV (FOR PTA / DISCHARGE USE ONLY): 3.3750 g | Freq: Three times a day (TID) | INTRAVENOUS | 0 refills | Status: DC

## 2020-02-01 MED ORDER — LIDOCAINE HCL (PF) 2 % IJ SOLN
INTRAMUSCULAR | Status: AC
  Filled 2020-02-01: qty 5

## 2020-02-01 SURGICAL SUPPLY — 42 items
BNDG ELASTIC 4X5.8 VLCR STR LF (GAUZE/BANDAGES/DRESSINGS) ×3 IMPLANT
BNDG ESMARK 4X9 LF (GAUZE/BANDAGES/DRESSINGS) ×3 IMPLANT
BNDG GAUZE ELAST 4 BULKY (GAUZE/BANDAGES/DRESSINGS) ×3 IMPLANT
CHLORAPREP W/TINT 26 (MISCELLANEOUS) IMPLANT
CORD BIPOLAR FORCEPS 12FT (ELECTRODE) ×3 IMPLANT
COVER SURGICAL LIGHT HANDLE (MISCELLANEOUS) ×3 IMPLANT
COVER WAND RF STERILE (DRAPES) IMPLANT
CUFF TOURN SGL QUICK 18X4 (TOURNIQUET CUFF) ×3 IMPLANT
CUFF TOURN SGL QUICK 24 (TOURNIQUET CUFF)
CUFF TRNQT CYL 24X4X16.5-23 (TOURNIQUET CUFF) IMPLANT
DRAPE U-SHAPE 47X51 STRL (DRAPES) ×3 IMPLANT
GAUZE SPONGE 4X4 12PLY STRL (GAUZE/BANDAGES/DRESSINGS) ×3 IMPLANT
GAUZE XEROFORM 5X9 LF (GAUZE/BANDAGES/DRESSINGS) ×3 IMPLANT
GLOVE BIOGEL PI IND STRL 8 (GLOVE) ×1 IMPLANT
GLOVE BIOGEL PI INDICATOR 8 (GLOVE) ×2
GLOVE SURG SYN 7.5  E (GLOVE) ×2
GLOVE SURG SYN 7.5 E (GLOVE) ×1 IMPLANT
GOWN STRL REUS W/ TWL LRG LVL3 (GOWN DISPOSABLE) ×2 IMPLANT
GOWN STRL REUS W/TWL LRG LVL3 (GOWN DISPOSABLE) ×6
IV NS IRRIG 3000ML ARTHROMATIC (IV SOLUTION) ×3 IMPLANT
KIT BASIN OR (CUSTOM PROCEDURE TRAY) ×3 IMPLANT
KIT TURNOVER KIT B (KITS) ×3 IMPLANT
MANIFOLD NEPTUNE II (INSTRUMENTS) ×3 IMPLANT
NEEDLE HYPO 25GX1X1/2 BEV (NEEDLE) IMPLANT
NS IRRIG 1000ML POUR BTL (IV SOLUTION) ×3 IMPLANT
PACK ORTHO EXTREMITY (CUSTOM PROCEDURE TRAY) ×3 IMPLANT
PAD ARMBOARD 7.5X6 YLW CONV (MISCELLANEOUS) ×6 IMPLANT
PAD CAST 4YDX4 CTTN HI CHSV (CAST SUPPLIES) IMPLANT
PADDING CAST COTTON 4X4 STRL (CAST SUPPLIES)
SET CYSTO W/LG BORE CLAMP LF (SET/KITS/TRAYS/PACK) ×3 IMPLANT
SPONGE LAP 4X18 RFD (DISPOSABLE) IMPLANT
SUT PROLENE 4 0 PS 2 18 (SUTURE) ×6 IMPLANT
SWAB CULTURE ESWAB REG 1ML (MISCELLANEOUS) IMPLANT
SYR CONTROL 10ML LL (SYRINGE) IMPLANT
TOWEL GREEN STERILE (TOWEL DISPOSABLE) ×3 IMPLANT
TOWEL GREEN STERILE FF (TOWEL DISPOSABLE) IMPLANT
TUBE CONNECTING 12'X1/4 (SUCTIONS) ×1
TUBE CONNECTING 12X1/4 (SUCTIONS) ×2 IMPLANT
TUBING TUR DISP (UROLOGICAL SUPPLIES) ×3 IMPLANT
UNDERPAD 30X36 HEAVY ABSORB (UNDERPADS AND DIAPERS) ×3 IMPLANT
WATER STERILE IRR 1000ML POUR (IV SOLUTION) ×3 IMPLANT
YANKAUER SUCT BULB TIP NO VENT (SUCTIONS) ×3 IMPLANT

## 2020-02-01 NOTE — OR Nursing (Signed)
Pt is awake,alert and oriented.Pt and/or family verbalized understanding of poc and discharge instructions. Reviewed admission and on going care with receiving RN. Pt is in NAD at this time and is ready to be transferred to floor. Will con't to monitor until pt is transferred.  

## 2020-02-01 NOTE — Progress Notes (Addendum)
PHARMACY CONSULT NOTE FOR:  OUTPATIENT  PARENTERAL ANTIBIOTIC THERAPY (OPAT)  Indication: Right hand osteomyelitis  Regimen: Zosyn 3.375 gm every 8 hours   End date: 02/29/2020  IV antibiotic discharge orders are pended. To discharging provider:  please sign these orders via discharge navigator,  Select New Orders & click on the button choice - Manage This Unsigned Work.     Thank you for allowing pharmacy to be a part of this patient's care.  Sharin Mons, PharmD, BCPS, BCIDP Infectious Diseases Clinical Pharmacist Phone: [phone] 02/01/2020, 11:42 AM

## 2020-02-01 NOTE — OR Nursing (Signed)
Spoke with daughter she is at home and will come see her dad tomorrow.

## 2020-02-01 NOTE — Anesthesia Procedure Notes (Signed)
Procedure Name: LMA Insertion Date/Time: 02/01/2020 5:40 PM Performed by: Tressia Miners, CRNA Pre-anesthesia Checklist: Patient identified, Emergency Drugs available, Suction available and Patient being monitored Patient Re-evaluated:Patient Re-evaluated prior to induction Oxygen Delivery Method: Circle System Utilized Preoxygenation: Pre-oxygenation with 100% oxygen Induction Type: IV induction Ventilation: Mask ventilation without difficulty LMA: LMA inserted LMA Size: 4.0 Number of attempts: 1 Airway Equipment and Method: Bite block Placement Confirmation: positive ETCO2 Tube secured with: Tape Dental Injury: Teeth and Oropharynx as per pre-operative assessment

## 2020-02-01 NOTE — Anesthesia Preprocedure Evaluation (Addendum)
Anesthesia Evaluation  Patient identified by MRN, date of birth, ID band Patient awake    Reviewed: Allergy & Precautions, Patient's Chart, lab work & pertinent test results  History of Anesthesia Complications Negative for: history of anesthetic complications  Airway Mallampati: II  TM Distance: >3 FB     Dental   Pulmonary neg pulmonary ROS,    breath sounds clear to auscultation       Cardiovascular hypertension,  Rhythm:Regular Rate:Normal     Neuro/Psych negative neurological ROS  negative psych ROS   GI/Hepatic negative GI ROS, (+)     substance abuse  alcohol use,   Endo/Other  diabetes, Poorly Controlled, Type 2  Renal/GU negative Renal ROS  negative genitourinary   Musculoskeletal negative musculoskeletal ROS (+)   Abdominal   Peds  Hematology negative hematology ROS (+)   Anesthesia Other Findings   Reproductive/Obstetrics                            Anesthesia Physical Anesthesia Plan  ASA: III  Anesthesia Plan: General   Post-op Pain Management:    Induction: Intravenous  PONV Risk Score and Plan: 2 and Ondansetron, Dexamethasone, Midazolam and Treatment may vary due to age or medical condition  Airway Management Planned: LMA  Additional Equipment: None  Intra-op Plan:   Post-operative Plan: Extubation in OR  Informed Consent:   Plan Discussed with: CRNA and Anesthesiologist  Anesthesia Plan Comments:        Anesthesia Quick Evaluation

## 2020-02-01 NOTE — Op Note (Signed)
PREOPERATIVE DIAGNOSIS: Severe right hand infection with open wound from previous small finger MP disarticulation  POSTOPERATIVE DIAGNOSIS: Same  ATTENDING PHYSICIAN: Gasper Lloyd. Roney Mans, III, MD who was present and scrubbed for the entire case   ASSISTANT SURGEON: None.   ANESTHESIA: General  SURGICAL PROCEDURES: 1.  Irrigation and excisional debridement of right hand open wound including skin and subcutaneous tissue measuring 3 x 3 cm in size 2.  Complex wound closure of the right hand open wound  SURGICAL INDICATIONS: Patient is a 64 year old male who had infected, necrotic small finger.  He underwent a MP joint disarticulation of the small finger on Wednesday and a wound VAC was placed.  He presents today for repeat irrigation and debridement of his hand as well as attempted wound closure.  FINDING: No gross purulence was identified within his wound.  Excisional debridement was performed of some further necrotic tissue within the wound though.  Soft tissues were manipulated to allow for a complex wound closure following irrigation debridement.  DESCRIPTION OF PROCEDURE: Patient was identified in the preop holding area where the risk benefits and alternatives of procedure were once again discussed with the patient.  These risks include but are not limited to infection, bleeding, damage to surrounding structures including blood vessels and nerves, pain, stiffness, need for additional procedures.  Informed consent was obtained that time the patient's right hand was marked with a surgical marking pen.  He was then brought back to the operative suite where timeout was performed identifying the correct patient operative site.  He was positioned supine on the operative table with his hand outstretched on a hand table.  He was induced under general anesthesia.  A tourniquet was placed on the upper arm.  He was already receiving IV antibiotics per infectious disease recommendations so no new antibiotics  were administered.  The previous wound VAC was removed and the right hand was prepped using a Betadine paint and scrub and draped in usual sterile fashion.  The limb was then exsanguinated and the tourniquet was inflated.  There was a 3 x 3 cm wound in the hand from his previous small finger MP joint disarticulation.  No gross purulence was appreciated within the wound.  There was some necrotic tissue though within the wound bed which was debrided excisionally using a rondure until all healthy tissue was visualized within the wound bed.  At this point the wound was then copiously irrigated with normal saline.  3L were utilized via cystoscopy tubing.  At this point wound closure began.  Multiple interrupted 4-0 Prolene sutures were utilized to approximate the wound edges.  15 blade was used to make back cuts in the skin proximally to allow for mobilization and skin coverage over the wound.  Dogears were removed sharply as well.  At the completion, multiple interrupted 4-0 Prolene sutures were used and there is a smooth wound closure over the previous wound bed which was a tension-free manner.  At this point Xeroform, 4 x 4's and a well-padded soft dressing were then applied.  The tourniquet was released and patient had return of brisk capillary refill to his remaining digits.  He was awoken from his anesthesia and extubated in the operating without a complications.  He was taken the PACU in stable condition.  ESTIMATED BLOOD LOSS: 15 mL  TOURNIQUET TIME: Less than 30 minutes  SPECIMENS: None  POSTOPERATIVE PLAN: Patient will be transferred back to floor under the hospitalist service.  Once definitive antibiotic plan has been established  and outpatient IV antibiotics can be continued, he is okay for discharge from hand surgery standpoint.  I will plan for close follow-up with him next week for wound check.  We will keep him in his surgical dressings until his follow-up appointment unless he is having  problems or issues going forward.  IMPLANTS: None

## 2020-02-01 NOTE — Progress Notes (Signed)
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  Patient was noted to have pus draining from the wound with necrotic tissue present.  Dr. Roney Mans request direct admission for surgical intervention.   Recently admitted from 01/18/2020 for right fifth digit laceration and found to have an open right fifth digit PIP dislocation with volar fracture of the middle phalanx with secondary infection.  Patient underwent irrigation debridement by Dr. Roney Mans and treated with IV Vanco/Flagyl and discharged on Augmentin.   Assessment & Plan:   Principal Problem:   Wound infection Active Problems:   Essential hypertension   Alcohol abuse   Type 2 diabetes mellitus (HCC)   Right hand abscess, recurrent Patient recently hospitalized following fall with laceration of the right fifth digit with open right fifth digit PIP dislocation with volar fracture and secondary infection.  Underwent irrigation debridement and discharged home on oral antibiotics.  Now representing with purulent drainage from wound with necrotic tissue.  X-ray right hand with no acute findings, remote distal radius fracture/boxer's fracture.  Underwent wound and debridement with amputation right small finger and wound VAC placement by Dr. Roney Mans on 01/30/2020. --Dr. Roney Mans plans repeat irrigation and debridement with possible wound closure today --Infectious disease following, appreciate assistance --Operative culture 12/8: Few E. coli, abundant Enterococcus faecalis; await  finalized culture with susceptibilities --PICC line placed today for outpatient IV antibiotics --Zosyn --Social work aware of the need for likely charity IV antibiotics at time of discharge --N.p.o. for planned surgical intervention this afternoon  Type 2 diabetes mellitus Hemoglobin A1c 11.8 on 01/19/2020, poorly controlled.  Home regimen includes Metformin 1000 mg p.o. twice daily, NPH 70/30 14 units twice daily. --Restart NPH 70/30 10 units BID postoperatively this afternoon --CBGs every 4 hours  EtOH use disorder Discussed with patient need for complete EtOH cessation. --CIWAA protocol with symptom triggered Ativan --Folate, thiamine, multivitamin  Essential hypertension On lisinopril 20 mg p.o. daily outpatient. --Restart lisinopril 20 mg p.o. daily --Hydralazine 25 mg p.o. every 6 hours as needed for SBP >170 or DBP >110 --Continue to hold home antihypertensives for now --Continue monitor BP closely   DVT prophylaxis: SCDs Code Status: Full code Family Communication: No family present at bedside this morning  Disposition Plan:  Status is: Inpatient  Remains inpatient appropriate because:Ongoing active pain requiring inpatient pain management, Ongoing diagnostic testing needed not appropriate for outpatient work up, Unsafe d/c plan, IV treatments appropriate due to intensity of illness or inability to take PO and Inpatient level of care appropriate due to severity of illness   Dispo: The patient is from: Home              Anticipated d/c is to: Home              Anticipated d/c date is: 3 days              Patient currently is not medically stable to d/c.    Consultants:   Orthopedic hand surgery, Dr. Roney Mans  Infectious disease, Dr. Daiva Eves  Procedures:   None  Antimicrobials:   Vancomycin 12/7 -  12/9  Cefepime 12/9 -12/9  Ertapenem 12/9 - 12/9  Zosyn 12/9>>   Subjective: Patient seen and examined bedside, resting comfortably.  Assisted with  Spanish video interpreter, Ryan Mcbride.  Pain fairly well controlled with wound VAC in place.  Orthopedics plans for further irrigation debridement and possible wound closure today.  Patient with no other complaints or concerns at this time. Denies headache, no chest pain, no palpitations, no shortness of breath, no abdominal pain, no fever/chills/night sweats, no nausea/vomiting/diarrhea.  No acute events overnight per nursing staff.  Objective: Vitals:   01/31/20 1205 01/31/20 1623 02/01/20 0009 02/01/20 0418  BP: (!) 152/90 (!) 150/91 (!) 152/83 130/89  Pulse: 71 74 73 67  Resp: 18 18 18 18   Temp: 97.7 F (36.5 C) 98.2 F (36.8 C) 99 F (37.2 C) 98 F (36.7 C)  TempSrc: Oral Oral Oral Oral  SpO2: 96% 97% 95% 97%  Weight:      Height:        Intake/Output Summary (Last 24 hours) at 02/01/2020 1116 Last data filed at 02/01/2020 0300 Gross per 24 hour  Intake 44.44 ml  Output 900 ml  Net -855.56 ml   Filed Weights   01/29/20 2120 01/30/20 1849  Weight: 68 kg 68 kg    Examination:  General exam: Appears calm and comfortable  Respiratory system: Clear to auscultation. Respiratory effort normal. Cardiovascular system: S1 & S2 heard, RRR. No JVD, murmurs, rubs, gallops or clicks. No pedal edema. Gastrointestinal system: Abdomen is nondistended, soft and nontender. No organomegaly or masses felt. Normal bowel sounds heard. Central nervous system: Alert and oriented. No focal neurological deficits. Extremities: Symmetric 5 x 5 power.  Noted left BKA Skin: Right hand with dressing/wound VAC in place, draining serosanguineous fluid Psychiatry: Judgement and insight appear normal. Mood & affect appropriate.     Data Reviewed: I have personally reviewed following labs and imaging studies  CBC: Recent Labs  Lab 01/30/20 0349 01/31/20 0246 02/01/20 0253  WBC 7.4 8.8 6.6  HGB 13.6 13.1 13.2  HCT 38.4* 38.8* 36.4*  MCV 87.3 89.2 86.1  PLT 345 349 372   Basic Metabolic  Panel: Recent Labs  Lab 01/30/20 0349 01/31/20 0246 02/01/20 0253  NA 133* 134* 133*  K 4.9 4.6 3.6  CL 100 100 100  CO2 24 23 25   GLUCOSE 191* 238* 165*  BUN 10 9 8   CREATININE 0.81 0.98 0.91  CALCIUM 9.1 8.6* 8.3*  MG 1.8 1.6*  --   PHOS 3.6  --   --    GFR: Estimated Creatinine Clearance: 71.3 mL/min (by C-G formula based on SCr of 0.91 mg/dL). Liver Function Tests: Recent Labs  Lab 01/30/20 0349  AST 19  ALT 17  ALKPHOS 69  BILITOT 0.7  PROT 6.6  ALBUMIN 3.0*   No results for input(s): LIPASE, AMYLASE in the last 168 hours. No results for input(s): AMMONIA in the last 168 hours. Coagulation Profile: No results for input(s): INR, PROTIME in the last 168 hours. Cardiac Enzymes: No results for input(s): CKTOTAL, CKMB, CKMBINDEX, TROPONINI in the last 168 hours. BNP (last 3 results) No results for input(s): PROBNP in the last 8760 hours. HbA1C: No results for input(s): HGBA1C in the last 72 hours. CBG: Recent Labs  Lab 01/31/20 1601 01/31/20 1959 02/01/20 0001 02/01/20 0415 02/01/20 0738  GLUCAP 154* 173* 238* 128* 156*   Lipid Profile: No results for input(s): CHOL, HDL, LDLCALC, TRIG, CHOLHDL, LDLDIRECT in the last 72 hours. Thyroid Function Tests: No  results for input(s): TSH, T4TOTAL, FREET4, T3FREE, THYROIDAB in the last 72 hours. Anemia Panel: No results for input(s): VITAMINB12, FOLATE, FERRITIN, TIBC, IRON, RETICCTPCT in the last 72 hours. Sepsis Labs: Recent Labs  Lab 01/30/20 0456  LATICACIDVEN 1.3    Recent Results (from the past 240 hour(s))  MRSA PCR Screening     Status: None   Collection Time: 01/30/20 12:51 AM  Result Value Ref Range Status   MRSA by PCR NEGATIVE NEGATIVE Final    Comment:        The GeneXpert MRSA Assay (FDA approved for NASAL specimens only), is one component of a comprehensive MRSA colonization surveillance program. It is not intended to diagnose MRSA infection nor to guide or monitor treatment for MRSA  infections. Performed at [facility], [address], [place], [place] [address]   Aerobic/Anaerobic Culture (surgical/deep wound)     Status: None (Preliminary result)   Collection Time: 01/30/20  8:45 PM   Specimen: Soft Tissue, Other  Result Value Ref Range Status   Specimen Description WOUND  Final   Special Requests RIGHT 5TH AND PINKY FINGER  Final   Gram Stain   Final    RARE WBC PRESENT, PREDOMINANTLY MONONUCLEAR MODERATE GRAM POSITIVE COCCI IN PAIRS IN CHAINS ABUNDANT GRAM NEGATIVE RODS    Culture   Final    FEW ESCHERICHIA COLI ABUNDANT ENTEROCOCCUS FAECALIS SUSCEPTIBILITIES TO FOLLOW Performed at [facility], [address], [place], [place] [address]    Report Status PENDING  Incomplete         Radiology Studies: [place] EKG SITE RITE  Result Date: 01/31/2020 If Site Rite image not attached, placement could not be confirmed due to current cardiac rhythm.       Scheduled Meds: . Chlorhexidine Gluconate Cloth  6 each Topical Daily  . folic acid  1 mg Oral Daily  . insulin aspart  0-9 Units Subcutaneous Q4H  . lisinopril  20 mg Oral Daily  . multivitamin with minerals  1 tablet Oral Daily  . povidone-iodine  2 application Topical Once  . thiamine  100 mg Oral Daily   Continuous Infusions: . piperacillin-tazobactam (ZOSYN)  IV 3.375 g (02/01/20 0514)     LOS: 3 days    Time spent: 37 minutes spent on chart review, discussion with nursing staff, consultants, updating family and interview/physical exam; more than 50% of that time was spent in counseling and/or coordination of care.    Alvira Philips [place], DO [facility] Available via Epic secure chat 7am-7pm After these hours, please refer to coverage provider listed on [email or web address] 02/01/2020, 11:16 AM

## 2020-02-01 NOTE — Progress Notes (Addendum)
Subjective: Patient was having PICC line placed   Antibiotics:  Anti-infectives (From admission, onward)   Start     Dose/Rate Route Frequency Ordered Stop   02/01/20 0000  piperacillin-tazobactam (ZOSYN) IVPB  Status:  Discontinued        13.5 g Intravenous Every 24 hours 02/01/20 1306 02/01/20    02/01/20 0000  piperacillin-tazobactam (ZOSYN) IVPB        3.375 g Intravenous Every 8 hours 02/01/20 1334 03/14/20 2359   01/31/20 2200  piperacillin-tazobactam (ZOSYN) IVPB 3.375 g        3.375 g 12.5 mL/hr over 240 Minutes Intravenous Every 8 hours 01/31/20 1608     01/31/20 1115  ertapenem (INVANZ) 1,000 mg in sodium chloride 0.9 % 100 mL IVPB  Status:  Discontinued        1 g 200 mL/hr over 30 Minutes Intravenous Every 24 hours 01/31/20 1029 01/31/20 1608   01/31/20 0930  ceFEPIme (MAXIPIME) 2 g in sodium chloride 0.9 % 100 mL IVPB  Status:  Discontinued        2 g 200 mL/hr over 30 Minutes Intravenous Every 8 hours 01/31/20 0844 01/31/20 1029   01/31/20 0800  vancomycin (VANCOREADY) IVPB 750 mg/150 mL  Status:  Discontinued        750 mg 150 mL/hr over 60 Minutes Intravenous Every 12 hours 01/30/20 2050 01/31/20 1029      Medications: Scheduled Meds: . Chlorhexidine Gluconate Cloth  6 each Topical Daily  . folic acid  1 mg Oral Daily  . insulin aspart  0-9 Units Subcutaneous Q4H  . lisinopril  20 mg Oral Daily  . multivitamin with minerals  1 tablet Oral Daily  . povidone-iodine  2 application Topical Once  . thiamine  100 mg Oral Daily   Continuous Infusions: . piperacillin-tazobactam (ZOSYN)  IV 3.375 g (02/01/20 1412)   PRN Meds:.hydrALAZINE, HYDROcodone-acetaminophen, LORazepam **OR** LORazepam, morphine injection, sodium chloride flush    Objective: Weight change:   Intake/Output Summary (Last 24 hours) at 02/01/2020 1444 Last data filed at 02/01/2020 1200 Gross per 24 hour  Intake 44.44 ml  Output 900 ml  Net -855.56 ml   Blood pressure 130/89,  pulse 67, temperature 98 F (36.7 C), temperature source Oral, resp. rate 18, height _0  (1.651 m), weight 68 kg, SpO2 97 %. Temp:  [98 F (36.7 C)-99 F (37.2 C)] 98 F (36.7 C) (12/10 0418) Pulse Rate:  [67-74] 67 (12/10 0418) Resp:  [18] 18 (12/10 0418) BP: (130-152)/(83-91) 130/89 (12/10 0418) SpO2:  [95 %-97 %] 97 % (12/10 0418)  Physical Exam: Physical Exam Cardiovascular:     Rate and Rhythm: Normal rate.  Pulmonary:     Effort: Pulmonary effort is normal. No respiratory distress.     Breath sounds: No wheezing.  Neurological:     General: No focal deficit present.     Mental Status: He is alert.   Patient having PICC line placed  CBC:    BMET Recent Labs    01/31/20 0246 02/01/20 0253  NA 134* 133*  K 4.6 3.6  CL 100 100  CO2 23 25  GLUCOSE 238* 165*  BUN 9 8  CREATININE 0.98 0.91  CALCIUM 8.6* 8.3*     Liver Panel  Recent Labs    01/30/20 0349  PROT 6.6  ALBUMIN 3.0*  AST 19  ALT 17  ALKPHOS 69  BILITOT 0.7       Sedimentation Rate No results  for input(s): ESRSEDRATE in the last 72 hours. C-Reactive Protein No results for input(s): CRP in the last 72 hours.  Micro Results: Recent Results (from the past 720 hour(s))  Resp Panel by RT-PCR (Flu A&B, Covid) Nasopharyngeal Swab     Status: None   Collection Time: 01/18/20  6:55 PM   Specimen: Nasopharyngeal Swab; Nasopharyngeal(NP) swabs in vial transport medium  Result Value Ref Range Status   SARS Coronavirus 2 by RT PCR NEGATIVE NEGATIVE Final    Comment: (NOTE) SARS-CoV-2 target nucleic acids are NOT DETECTED.  The SARS-CoV-2 RNA is generally detectable in upper respiratory specimens during the acute phase of infection. The lowest concentration of SARS-CoV-2 viral copies this assay can detect is 138 copies/mL. A negative result does not preclude SARS-Cov-2 infection and should not be used as the sole basis for treatment or other patient management decisions. A negative result  may occur with  improper specimen collection/handling, submission of specimen other than nasopharyngeal swab, presence of viral mutation(s) within the areas targeted by this assay, and inadequate number of viral copies(<138 copies/mL). A negative result must be combined with clinical observations, patient history, and epidemiological information. The expected result is Negative.  Fact Sheet for Patients:  EntrepreneurPulse.com.au  Fact Sheet for Healthcare Providers:  IncredibleEmployment.be  This test is no t yet approved or cleared by the [place] FDA and  has been authorized for detection and/or diagnosis of SARS-CoV-2 by FDA under an Emergency Use Authorization (EUA). This EUA will remain  in effect (meaning this test can be used) for the duration of the COVID-19 declaration under Section 564(b)(1) of the Act, 21 U.S.C.section 360bbb-3(b)(1), unless the authorization is terminated  or revoked sooner.       Influenza A by PCR NEGATIVE NEGATIVE Final   Influenza B by PCR NEGATIVE NEGATIVE Final    Comment: (NOTE) The Xpert Xpress SARS-CoV-2/FLU/RSV plus assay is intended as an aid in the diagnosis of influenza from Nasopharyngeal swab specimens and should not be used as a sole basis for treatment. Nasal washings and aspirates are unacceptable for Xpert Xpress SARS-CoV-2/FLU/RSV testing.  Fact Sheet for Patients: EntrepreneurPulse.com.au  Fact Sheet for Healthcare Providers: IncredibleEmployment.be  This test is not yet approved or cleared by the [place] FDA and has been authorized for detection and/or diagnosis of SARS-CoV-2 by FDA under an Emergency Use Authorization (EUA). This EUA will remain in effect (meaning this test can be used) for the duration of the COVID-19 declaration under Section 564(b)(1) of the Act, 21 U.S.C. section 360bbb-3(b)(1), unless the authorization is terminated  or revoked.  Performed at [facility], [place] [address], [place], [place] [address]   Aerobic/Anaerobic Culture (surgical/deep wound)     Status: None   Collection Time: 01/18/20  9:40 PM   Specimen: Wound  Result Value Ref Range Status   Specimen Description WOUND  Final   Special Requests RIGHT SMALL FINGER  Final   Gram Stain   Final    FEW WBC PRESENT,BOTH PMN AND MONONUCLEAR ABUNDANT GRAM POSITIVE COCCI FEW GRAM NEGATIVE RODS    Culture   Final    ABUNDANT STAPHYLOCOCCUS AUREUS ABUNDANT GROUP B STREP(S.AGALACTIAE)ISOLATED TESTING AGAINST S. AGALACTIAE NOT ROUTINELY PERFORMED DUE TO PREDICTABILITY OF AMP/PEN/VAN SUSCEPTIBILITY. FEW PREVOTELLA MELANINOGENICA BETA LACTAMASE POSITIVE Performed at [facility], [place] [address], [place], [place] [address]    Report Status 01/22/2020 FINAL  Final   Organism ID, Bacteria STAPHYLOCOCCUS AUREUS  Final      Susceptibility  Staphylococcus aureus - MIC*    CIPROFLOXACIN <=0.5 SENSITIVE Sensitive     ERYTHROMYCIN >=8 RESISTANT Resistant     GENTAMICIN <=0.5 SENSITIVE Sensitive     OXACILLIN 0.5 SENSITIVE Sensitive     TETRACYCLINE >=16 RESISTANT Resistant     VANCOMYCIN <=0.5 SENSITIVE Sensitive     TRIMETH/SULFA <=10 SENSITIVE Sensitive     CLINDAMYCIN <=0.25 SENSITIVE Sensitive     RIFAMPIN <=0.5 SENSITIVE Sensitive     Inducible Clindamycin NEGATIVE Sensitive     * ABUNDANT STAPHYLOCOCCUS AUREUS  MRSA PCR Screening     Status: None   Collection Time: 01/30/20 12:51 AM  Result Value Ref Range Status   MRSA by PCR NEGATIVE NEGATIVE Final    Comment:        The GeneXpert MRSA Assay (FDA approved for NASAL specimens only), is one component of a comprehensive MRSA colonization surveillance program. It is not intended to diagnose MRSA infection nor to guide or monitor treatment for MRSA infections. Performed at [facility], [place] [address], [place], [place] [address]   Aerobic/Anaerobic Culture  (surgical/deep wound)     Status: None (Preliminary result)   Collection Time: 01/30/20  8:45 PM   Specimen: Soft Tissue, Other  Result Value Ref Range Status   Specimen Description WOUND  Final   Special Requests RIGHT 5TH AND PINKY FINGER  Final   Gram Stain   Final    RARE WBC PRESENT, PREDOMINANTLY MONONUCLEAR MODERATE GRAM POSITIVE COCCI IN PAIRS IN CHAINS ABUNDANT GRAM NEGATIVE RODS    Culture   Final    FEW ESCHERICHIA COLI ABUNDANT ENTEROCOCCUS FAECALIS CULTURE REINCUBATED FOR BETTER GROWTH HOLDING FOR POSSIBLE ANAEROBE Performed at [facility], [place] [address] [place], [place] [address]    Report Status PENDING  Incomplete    Studies/Results: [place] EKG SITE RITE  Result Date: 01/31/2020 If Site Rite image not attached, placement could not be confirmed due to current cardiac rhythm.     Assessment/Plan:  INTERVAL HISTORY: Cultures now with Enterococcus and E. coli growing   Principal Problem:   Wound infection Active Problems:   Essential hypertension   Alcohol abuse   Type 2 diabetes mellitus ([place])    Ryan Mcbride is a 64 y.o. male with poorly controlled diabetes mellitus and alcoholism who is had multiple amputations who fell while drunk and walking about on his prosthetic leg.  He injured his hand and ultimately underwent reduction of PIP joint and treatment of a fracture with I&D in November 2021.  Cultures at that time it yielded methicillin sensitive staph coccus aureus group A streptococcus and Prevotella.  He had been treated with IV vancomycin and metronidazole and discharged on Augmentin.  He was then seen in follow-up with worsening purulence and necrosis from the wound.  Subsequent undergone amputation of the right small finger through the middle phalangeal joint with irrigation and debridement extending of the distal palm.  Cultures are now showing E. coli and Enterococcus with susceptibilities pending.  We have switched him over to Zosyn  which will be easier to give his a continuous infusion if he is discharged to home.  Plan for now is for him to go home and complete 4 weeks of IV Zosyn with follow-up with [place] in the clinic.  We will monitor his cultures over the weekend and adjust antibiotics as needed.  Diagnosis: Still myelitis Culture Result: Recent culture with E. coli and Enterococcus prior culture with MSSA, beta strep and Prevotella  No  Known Allergies  OPAT Orders Discharge antibiotics:  Zosyn  Duration:  4  Weeks  End Date:    [facility] Per Protocol:    Labs  weekly while on IV antibiotics: _x_ CBC with differential x__ BMP w GFR/CMP x__ CRP x__ ESR    _x_ Please pull PIC at completion of IV antibiotics __ Please leave PIC in place until doctor has seen patient or been notified  Fax weekly labs to [phone]  Clinic Follow Up Appt:   Ryan Mcbride has an appointment on 02/27/2020 at 4pm with Dr. [name]  [facility] for Infectious Disease is located in the [facility]  [place] in [place].  [address], which is located to the left of the elevators.  Phone: [phone]  He should arrive 15 minutes prior to his appt.  Fax: [phone]  [email or web address]     LOS: 3 days   Alcide Evener 02/01/2020, 2:44 PM

## 2020-02-01 NOTE — Progress Notes (Signed)
   Ortho Hand Progress Note  Subjective: No acute events last night. Pain in right hand controlled. Has been NPO   Objective: Vital signs in last 24 hours: Temp:  [98 F (36.7 C)-99 F (37.2 C)] 98.3 F (36.8 C) (12/10 1200) Pulse Rate:  [67-73] 72 (12/10 1200) Resp:  [18] 18 (12/10 1200) BP: (130-177)/(79-89) 177/79 (12/10 1200) SpO2:  [95 %-97 %] 97 % (12/10 0418)  Intake/Output from previous day: 12/09 0701 - 12/10 0700 In: 44.4 [IV Piggyback:44.4] Out: 1225 [Urine:1200; Drains:25] Intake/Output this shift: No intake/output data recorded.  Recent Labs    01/30/20 0349 01/31/20 0246 02/01/20 0253  HGB 13.6 13.1 13.2   Recent Labs    01/31/20 0246 02/01/20 0253  WBC 8.8 6.6  RBC 4.35 4.23  HCT 38.8* 36.4*  PLT 349 372   Recent Labs    01/31/20 0246 02/01/20 0253  NA 134* 133*  K 4.6 3.6  CL 100 100  CO2 23 25  BUN 9 8  CREATININE 0.98 0.91  GLUCOSE 238* 165*  CALCIUM 8.6* 8.3*   No results for input(s): LABPT, INR in the last 72 hours.  Aaox3 nad Resp nonlabored RRR RUE: Dressings in place. Wound vac with serosang output. Good seal. Intact motor and sensation to the remaining digits. Fingers all wwp with bcr.   Assessment/Plan: Necrotic and severely infected right small finger s/p MP joint disarticulation and I&D, wound vac placement. DOS 12/8  - Medicine primary. Appreciate management - ID following. Appreciate abx management - Continue wound vac and current dressings - Plan for repeat I&D with attempted wound closure today - r/b/a discussed with the patient once again today. Risks include continued infection, bleeding, damage to surrounding structures including blood vessels and nerves, pain, stiffness and need for additional procedures.  - NPO until post op - Remainder per primary   Cain Saupe III 02/01/2020, 4:35 PM  [phone]

## 2020-02-01 NOTE — TOC Initial Note (Addendum)
Transition of Care [facility]) - Initial/Assessment Note    Patient Details  Name: Ryan Mcbride MRN: [id number] Date of Birth: [birth year]  [facility] CM/SW Contact:    Kingsley Plan, RN Phone Number: 02/01/2020, 11:19 AM  Clinical Narrative:                  Used interpreter Arlen [phone]. Patient from home with daughter Essie Gehret [phone].   NCM explained via interpreter plan is to discharge to home with PICC and IV ABX. Patient will have HHRN however, prior to discharge he and daughter will be taught PICC line care and giving IV ABX. HHRN will not be present each time a dose is due.   Patient has a follow up appointment with Gwinda Passe for Monday. Discussed with Attending. Patient may DC tomorrow, NCM will leave appointment . If patient still in hospital Monday will change appointment.   Patient voiced understanding to all of above. NCM called Yoshi and explained all of above, she voiced understanding.  Possible discharge tomorrow.  Expected Discharge Plan: Home w Home Health Services Barriers to Discharge: Continued Medical Work up   Patient Goals and CMS Choice Patient states their goals for this hospitalization and ongoing recovery are:: to return to home CMS Medicare.gov Compare Post Acute Care list provided to:: Patient Choice offered to / list presented to : Patient,Adult Children  Expected Discharge Plan and Services Expected Discharge Plan: Home w Home Health Services   Discharge Planning Services: CM Consult Post Acute Care Choice: Home Health Living arrangements for the past 2 months: Apartment                 DME Arranged: N/A         HH Arranged: RN HH AgencyHotel manager Home Health Care Date [facility] Agency Contacted: 02/01/20 Time HH Agency Contacted: 1117 Representative spoke with at [facility] Agency: Kandee Keen  Prior Living Arrangements/Services Living arrangements for the past 2 months: Apartment Lives with:: Adult Children Patient language and  need for interpreter reviewed:: Yes Do you feel safe going back to the place where you live?: Yes      Need for Family Participation in Patient Care: Yes (Comment) Care giver support system in place?: Yes (comment)   Criminal Activity/Legal Involvement Pertinent to Current Situation/Hospitalization: No - Comment as needed  Activities of Daily Living Home Assistive Devices/Equipment: Prosthesis ADL Screening (condition at time of admission) Patient's cognitive ability adequate to safely complete daily activities?: Yes Is the patient deaf or have difficulty hearing?: No Does the patient have difficulty seeing, even when wearing glasses/contacts?: No Does the patient have difficulty concentrating, remembering, or making decisions?: No Patient able to express need for assistance with ADLs?: Yes Does the patient have difficulty dressing or bathing?: No Independently performs ADLs?: Yes (appropriate for developmental age) Does the patient have difficulty walking or climbing stairs?: Yes Weakness of Legs: Left Weakness of Arms/Hands: None  Permission Sought/Granted   Permission granted to share information with : Yes, Verbal Permission Granted  Share Information with NAME: Dawsyn Ramsaran [phone]     Permission granted to share info w Relationship: daughter     Emotional Assessment Appearance:: Appears stated age Attitude/Demeanor/Rapport: Engaged Affect (typically observed): Accepting Orientation: : Oriented to Self,Oriented to Place,Oriented to Situation,Oriented to  Time   Psych Involvement: No (comment)  Admission diagnosis:  Wound infection [T14.8XXA, L08.9] Patient Active Problem List   Diagnosis Date Noted  . Wound infection 01/30/2020  .  Type 2 diabetes mellitus (HCC) 01/30/2020  . Cellulitis of finger of right hand 01/18/2020  . Closed fracture dislocation of proximal interphalangeal (PIP) joint of finger 01/18/2020  . Uncontrolled type 2 diabetes mellitus with  hyperglycemia, without long-term current use of insulin (HCC) 01/18/2020  . Essential hypertension 01/18/2020  . Alcohol abuse 01/18/2020  . Seizure [facility])    PCP:  Patient, [name] Pharmacy:   [facility] [place], [place] - [address] [place] [address] Phone: [phone] Fax: [phone]     Social Determinants of Health (SDOH) Interventions    Readmission Risk Interventions Readmission Risk Prevention Plan 01/22/2020  Post Dischage Appt Complete  Medication Screening Complete  Transportation Screening Complete  Some recent data might be hidden

## 2020-02-01 NOTE — Progress Notes (Signed)
Peripherally Inserted Central Catheter Placement  The IV Nurse has discussed with the patient and/or persons authorized to consent for the patient, the purpose of this procedure and the potential benefits and risks involved with this procedure.  The benefits include less needle sticks, lab draws from the catheter, and the patient may be discharged home with the catheter. Risks include, but not limited to, infection, bleeding, blood clot (thrombus formation), and puncture of an artery; nerve damage and irregular heartbeat and possibility to perform a PICC exchange if needed/ordered by physician.  Alternatives to this procedure were also discussed.  Bard Power PICC patient education guide, fact sheet on infection prevention and patient information card has been provided to patient /or left at bedside.    PICC Placement Documentation  PICC Single Lumen 02/01/20 PICC Left Basilic 41 cm 0 cm (Active)  Indication for Insertion or Continuance of Line Home intravenous therapies (PICC only) 02/01/20 0938  Exposed Catheter (cm) 0 cm 02/01/20 2620  Site Assessment Clean;Dry;Intact 02/01/20 0938  Line Status Blood return noted;Flushed;Saline locked 02/01/20 3559  Dressing Type Transparent;Securing device 02/01/20 7416  Dressing Status Clean;Dry;Intact 02/01/20 0938  Antimicrobial disc in place? Yes 02/01/20 0938  Safety Lock Not Applicable 02/01/20 0938  Dressing Change Due 02/08/20 02/01/20 0938       Romie Jumper 02/01/2020, 9:41 AM

## 2020-02-01 NOTE — Transfer of Care (Signed)
Immediate Anesthesia Transfer of Care Note  Patient: Ryan Mcbride  Procedure(s) Performed: Right small finger repeat irrigation and debridement (Right Finger)  Patient Location: PACU  Anesthesia Type:General  Level of Consciousness: drowsy  Airway & Oxygen Therapy: Patient Spontanous Breathing and Patient connected to face mask oxygen  Post-op Assessment: Report given to RN and Post -op Vital signs reviewed and stable  Post vital signs: Reviewed and stable  Last Vitals:  Vitals Value Taken Time  BP 149/69 02/01/20 1824  Temp    Pulse 85 02/01/20 1826  Resp 18 02/01/20 1826  SpO2 100 % 02/01/20 1826  Vitals shown include unvalidated device data.  Last Pain:  Vitals:   02/01/20 1200  TempSrc: Oral  PainSc:       Patients Stated Pain Goal: 0 (01/30/20 2337)  Complications: No complications documented.

## 2020-02-02 LAB — GLUCOSE, CAPILLARY
Glucose-Capillary: 254 mg/dL — ABNORMAL HIGH (ref 70–99)
Glucose-Capillary: 263 mg/dL — ABNORMAL HIGH (ref 70–99)
Glucose-Capillary: 272 mg/dL — ABNORMAL HIGH (ref 70–99)
Glucose-Capillary: 305 mg/dL — ABNORMAL HIGH (ref 70–99)
Glucose-Capillary: 381 mg/dL — ABNORMAL HIGH (ref 70–99)

## 2020-02-02 LAB — BASIC METABOLIC PANEL
Anion gap: 12 (ref 5–15)
BUN: 8 mg/dL (ref 8–23)
CO2: 21 mmol/L — ABNORMAL LOW (ref 22–32)
Calcium: 8.4 mg/dL — ABNORMAL LOW (ref 8.9–10.3)
Chloride: 100 mmol/L (ref 98–111)
Creatinine, Ser: 1.04 mg/dL (ref 0.61–1.24)
GFR, Estimated: >60 mL/min (ref >60)
Glucose, Bld: 285 mg/dL — ABNORMAL HIGH (ref 70–99)
Potassium: 4.2 mmol/L (ref 3.5–5.1)
Sodium: 133 mmol/L — ABNORMAL LOW (ref 135–145)

## 2020-02-02 LAB — CBC
HCT: 36.6 % — ABNORMAL LOW (ref 39.0–52.0)
Hemoglobin: 13.1 g/dL (ref 13.0–17.0)
MCH: 31 pg (ref 26.0–34.0)
MCHC: 35.8 g/dL (ref 30.0–36.0)
MCV: 86.5 fL (ref 80.0–100.0)
Platelets: 362 10*3/uL (ref 150–400)
RBC: 4.23 MIL/uL (ref 4.22–5.81)
RDW: 11.5 % (ref 11.5–15.5)
WBC: 7.9 10*3/uL (ref 4.0–10.5)
nRBC: 0 % (ref 0.0–0.2)

## 2020-02-02 LAB — HEPATITIS B SURFACE ANTIBODY, QUANTITATIVE: Hep B S AB Quant (Post): <3.1 m[IU]/mL — ABNORMAL LOW (ref >9.9)

## 2020-02-02 MED ORDER — HYDROCODONE-ACETAMINOPHEN 5-325 MG PO TABS: 1.0000 | ORAL_TABLET | Freq: Four times a day (QID) | ORAL | 0 refills | Status: AC | PRN

## 2020-02-02 NOTE — Progress Notes (Signed)
Nsg Discharge Note  Patient discharging home with PICC line. Instructions reviewed with patient and his daughter.  Admit Date:  01/29/2020 Discharge date: 02/02/2020   Ryan Mcbride to be D/C'd Home per MD order.  AVS completed.  Copy for chart, and copy for patient signed, and dated. Patient/caregiver able to verbalize understanding.  Discharge Medication: Allergies as of 02/02/2020   No Known Allergies     Medication List    STOP taking these medications   amoxicillin-clavulanate 875-125 MG tablet Commonly known as: AUGMENTIN   oxyCODONE-acetaminophen 5-325 MG tablet Commonly known as: PERCOCET/ROXICET     TAKE these medications   folic acid 1 MG tablet Commonly known as: FOLVITE Take 1 tablet (1 mg total) by mouth daily.   HYDROcodone-acetaminophen 5-325 MG tablet Commonly known as: NORCO/VICODIN Take 1 tablet by mouth every 6 (six) hours as needed for up to 8 days for moderate pain.   insulin aspart protamine- aspart (70-30) 100 UNIT/ML injection Commonly known as: NOVOLOG MIX 70/30 Inject 0.14 mLs (14 Units total) into the skin 2 (two) times daily with a meal.   Insulin Syringe 27G X 1/2" 1 ML Misc 1 application by Does not apply route 2 (two) times daily.   lisinopril 20 MG tablet Commonly known as: ZESTRIL Take 1 tablet (20 mg total) by mouth daily.   metFORMIN 1000 MG tablet Commonly known as: GLUCOPHAGE Take 1 tablet (1,000 mg total) by mouth 2 (two) times daily with a meal.   multivitamin with minerals Tabs tablet Take 1 tablet by mouth daily.   piperacillin-tazobactam  IVPB Commonly known as: ZOSYN Inject 3.375 g into the vein every 8 (eight) hours for 28 days. As a continuous infusion. Indication:  Osteomyelitis of right hand  First Dose: Yes Last Day of Therapy:  02/29/2020 Labs - Once weekly:  CBC/D and BMP, Labs - Every other week:  ESR and CRP Method of administration: Elastomeric (Continuous infusion) Method of administration may be changed at  the discretion of home infusion pharmacist based upon assessment of the patient and/or caregiver's ability to self-administer the medication ordered.   polyethylene glycol 17 g packet Commonly known as: MIRALAX / GLYCOLAX Take 17 g by mouth daily as needed for mild constipation.   thiamine 100 MG tablet Take 1 tablet (100 mg total) by mouth daily.            Discharge Care Instructions  (From admission, onward)         Start     Ordered   02/02/20 0000  Leave dressing on - Keep it clean, dry, and intact until clinic visit        02/02/20 1019   02/01/20 0000  Change dressing on IV access line weekly and PRN  (Home infusion instructions - [facility] )        02/01/20 1306          Discharge Assessment: Vitals:   02/02/20 0815 02/02/20 1212  BP: (!) 141/86 (!) 149/86  Pulse: 75 83  Resp:  18  Temp:  98.2 F (36.8 C)  SpO2:  92%   Skin clean, dry and intact without evidence of skin break down, no evidence of skin tears noted. IV catheter discontinued intact. Site without signs and symptoms of complications - no redness or edema noted at insertion site, patient denies c/o pain - only slight tenderness at site.  Dressing with slight pressure applied.  D/c Instructions-Education: Discharge instructions given to patient/family with verbalized understanding. D/c education  completed with patient/family including follow up instructions, medication list, d/c activities limitations if indicated, with other d/c instructions as indicated by MD - patient able to verbalize understanding, all questions fully answered. Patient instructed to return to ED, call 911, or call MD for any changes in condition.  Patient escorted via [place], and D/C home via private auto.  Erasmo Leventhal, RN 02/02/2020 4:02 PM

## 2020-02-02 NOTE — Discharge Summary (Signed)
Physician Discharge Summary  Ryan Mcbride ZOX:[id number] DOB: 02-25-1955 DOA: 01/29/2020  PCP: Patient, [name]  Admit date: 01/29/2020 Discharge date: 02/02/2020  Admitted From: Home Disposition: Home  Recommendations for Outpatient Follow-up:  1. Follow up with PCP, Juluis Mire scheduled on 02/04/2020 2. Follow-up with orthopedics, Dr. Jeannie Fend 3. Follow-up with infectious disease, Dr. Drucilla Schmidt scheduled on 02/27/2020 at 4 PM 4. Continue IV Zosyn for 4 weeks per ID; PICC line in place 5. 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  Patient was noted to have pus draining from the wound with necrotic tissue present.  Dr. Jeannie Fend request direct admission for surgical intervention.   Recently admitted from 01/18/2020 for right fifth digit laceration and found to have an open right fifth digit PIP dislocation with volar fracture of the middle phalanx with secondary infection.  Patient underwent irrigation debridement by Dr. Jeannie Fend and treated with IV Vanco/Flagyl and discharged on Augmentin.  Hospital course:  Right hand abscess, recurrent Patient recently hospitalized following fall with laceration of the right fifth digit with open right fifth digit PIP dislocation with volar fracture and secondary infection.  Underwent irrigation debridement and discharged home on  oral antibiotics.  Now representing with purulent drainage from wound with necrotic tissue.  X-ray right hand with no acute findings, remote distal radius fracture/boxer's fracture.  Underwent wound and debridement with amputation right small finger and wound VAC placement by Dr. Jeannie Fend on 01/30/2020.  Patient underwent further irrigation and excisional debridement of right hand open wound with complex wound closure on 02/01/2020.  Operative culture 01/30/2020 with few E. coli, abundant Enterococcus faecalis.  ID was consulted and followed during hospital course.  Plan four week course of IV antibiotics outpatient.  PICC line placed.  Patient will need close follow-up with orthopedics within 1 week.  Also has scheduled follow-up with ID, Dr. Drucilla Schmidt on 02/27/2020 at 4 PM.  Continue dressing in place until follows up with orthopedics.  Type 2 diabetes mellitus Hemoglobin A1c 11.8 on 01/19/2020, poorly controlled.  Home regimen includes Metformin 1000 mg p.o. twice daily, NPH 70/30 14 units twice daily.  Outpatient follow-up with PCP scheduled on 02/04/2020, likely will need further titration.  EtOH use disorder Discussed with patient need for complete EtOH cessation.  Essential hypertension Lisinopril 20 mg p.o. daily outpatient.  Discharge Diagnoses:  Principal Problem:   Wound infection Active Problems:   Essential hypertension   Alcohol abuse   Type 2 diabetes mellitus ([place])   Osteomyelitis of finger [facility])    Discharge Instructions  Discharge Instructions    [facility] pharmacist to adjust dose for Vancomycin, Aminoglycosides and other anti-infective therapies as requested by physician.   Complete by: As directed    [facility] to provide Cath Flo $Remove'2mg'ThoufLM$    Complete by: As directed    Administer for PICC line occlusion and as ordered by physician for other access device issues.   Anaphylaxis Kit: Provided to treat any anaphylactic reaction to  the medication being  provided to the patient if First Dose or when requested by physician   Complete by: As directed    Epinephrine 1mg /ml vial / amp: Administer 0.3mg  (0.78ml) subcutaneously once for moderate to severe anaphylaxis, nurse to call physician and pharmacy when reaction occurs and call 911 if needed for immediate care   Diphenhydramine 50mg /ml IV vial: Administer 25-50mg  IV/IM PRN for first dose reaction, rash, itching, mild reaction, nurse to call physician and pharmacy when reaction occurs   Sodium Chloride 0.9% NS 589ml IV: Administer if needed for hypovolemic blood pressure drop or as ordered by physician after call to physician with anaphylactic reaction   Call MD for:  difficulty breathing, headache or visual disturbances   Complete by: As directed    Call MD for:  extreme fatigue   Complete by: As directed    Call MD for:  persistant dizziness or light-headedness   Complete by: As directed    Call MD for:  persistant nausea and vomiting   Complete by: As directed    Call MD for:  severe uncontrolled pain   Complete by: As directed    Call MD for:  temperature >100.4   Complete by: As directed    Change dressing on IV access line weekly and PRN   Complete by: As directed    Diet - low sodium heart healthy   Complete by: As directed    Flush IV access with Sodium Chloride 0.9% and Heparin 10 units/ml or 100 units/ml   Complete by: As directed    Home infusion instructions - [facility]   Complete by: As directed    Instructions: Flush IV access with Sodium Chloride 0.9% and Heparin 10units/ml or 100units/ml   Change dressing on IV access line: Weekly and PRN   Instructions Cath Flo 2mg : Administer for PICC Line occlusion and as ordered by physician for other access device   [facility] pharmacist to adjust dose for: Vancomycin, Aminoglycosides and other anti-infective therapies as requested by physician   Increase activity slowly   Complete by: As directed    Leave  dressing on - Keep it clean, dry, and intact until clinic visit   Complete by: As directed    Method of administration may be changed at the discretion of home infusion pharmacist based upon assessment of the patient and/or caregiver's ability to self-administer the medication ordered   Complete by: As directed      Allergies as of 02/02/2020   No Known Allergies     Medication List    STOP taking these medications   amoxicillin-clavulanate 875-125 MG tablet Commonly known as: AUGMENTIN   oxyCODONE-acetaminophen 5-325 MG tablet Commonly known as: PERCOCET/ROXICET     TAKE these medications   folic acid 1 MG tablet Commonly known as: FOLVITE Take 1 tablet (1 mg total) by mouth daily.   HYDROcodone-acetaminophen 5-325 MG tablet Commonly known as: NORCO/VICODIN Take 1 tablet by mouth every 6 (six) hours as needed for up to 8 days for moderate pain.   insulin aspart protamine- aspart (70-30) 100 UNIT/ML injection Commonly known as: NOVOLOG MIX 70/30 Inject 0.14 mLs (14 Units total) into the skin 2 (two) times daily with a meal.   Insulin Syringe 27G X 1/2" 1 ML Misc 1 application by Does not apply route 2 (two) times daily.   lisinopril 20 MG tablet Commonly known as: ZESTRIL Take 1 tablet (20 mg total) by mouth daily.   metFORMIN 1000 MG tablet  Commonly known as: GLUCOPHAGE Take 1 tablet (1,000 mg total) by mouth 2 (two) times daily with a meal.   multivitamin with minerals Tabs tablet Take 1 tablet by mouth daily.   piperacillin-tazobactam  IVPB Commonly known as: ZOSYN Inject 3.375 g into the vein every 8 (eight) hours for 28 days. As a continuous infusion. Indication:  Osteomyelitis of right hand  First Dose: Yes Last Day of Therapy:  02/29/2020 Labs - Once weekly:  CBC/D and BMP, Labs - Every other week:  ESR and CRP Method of administration: Elastomeric (Continuous infusion) Method of administration may be changed at the discretion of home infusion pharmacist  based upon assessment of the patient and/or caregiver's ability to self-administer the medication ordered.   polyethylene glycol 17 g packet Commonly known as: MIRALAX / GLYCOLAX Take 17 g by mouth daily as needed for mild constipation.   thiamine 100 MG tablet Take 1 tablet (100 mg total) by mouth daily.            Discharge Care Instructions  (From admission, onward)         Start     Ordered   02/02/20 0000  Leave dressing on - Keep it clean, dry, and intact until clinic visit        02/02/20 1019   02/01/20 0000  Change dressing on IV access line weekly and PRN  (Home infusion instructions - [facility] )        02/01/20 1306          Follow-up Information    Kerin Perna, NP Follow up.   Specialty: Internal Medicine Why: February 04, 2020 at 2:30  Contact information: [place] [address] [phone]        [name], [name], MD. Go on 02/27/2020.   Specialty: Infectious Diseases Contact information: [address] [place] [address] [phone]              No Known Allergies  Consultations:  Orthopedic hand surgery, Dr. Jeannie Fend  Infectious disease, Dr. [name]   Procedures/Studies: CT Head Wo Contrast  Result Date: 01/18/2020 CLINICAL DATA:  64 year old male with facial trauma. EXAM: CT HEAD WITHOUT CONTRAST CT MAXILLOFACIAL WITHOUT CONTRAST CT CERVICAL SPINE WITHOUT CONTRAST TECHNIQUE: Multidetector CT imaging of the head, cervical spine, and maxillofacial structures were performed using the standard protocol without intravenous contrast. Multiplanar CT image reconstructions of the cervical spine and maxillofacial structures were also generated. COMPARISON:  Brain MRI dated 06/20/2017. FINDINGS: CT HEAD FINDINGS Brain: The ventricles and sulci appropriate size for patient's age. 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 There is no acute intracranial  hemorrhage. No mass effect midline shift no extra-axial fluid collection. Vascular: No hyperdense vessel or unexpected calcification. Skull: Normal. Negative for fracture or focal lesion. Other: None CT MAXILLOFACIAL FINDINGS Osseous: There is a fracture of the right nasal bone. No other acute fracture. No mandibular subluxation. Orbits: The globes and retro-orbital fat are preserved. Sinuses: Mild mucoperiosteal thickening of paranasal sinuses. No air-fluid level. The mastoid air cells are clear. Soft tissues: Mild soft tissue swelling over the nose. CT CERVICAL SPINE FINDINGS Alignment: No acute subluxation. Skull base and vertebrae: No acute fracture. Soft tissues and spinal canal: No prevertebral fluid or swelling. No visible canal hematoma. Disc levels: Degenerative changes with anterior osteophyte primarily at C3-C4 and C4-C5. Upper chest: Negative. Other: None IMPRESSION: 1. No acute intracranial pathology. 2. No acute/traumatic cervical spine pathology. 3. Right  nasal bone fracture. Electronically Signed   By: Anner Crete M.D.   On: 01/18/2020 19:50   CT Cervical Spine Wo Contrast  Result Date: 01/18/2020 CLINICAL DATA:  64 year old male with facial trauma. EXAM: CT HEAD WITHOUT CONTRAST CT MAXILLOFACIAL WITHOUT CONTRAST CT CERVICAL SPINE WITHOUT CONTRAST TECHNIQUE: Multidetector CT imaging of the head, cervical spine, and maxillofacial structures were performed using the standard protocol without intravenous contrast. Multiplanar CT image reconstructions of the cervical spine and maxillofacial structures were also generated. COMPARISON:  Brain MRI dated 06/20/2017. FINDINGS: CT HEAD FINDINGS Brain: The ventricles and sulci appropriate size for patient's age. 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 There is no acute intracranial hemorrhage. No mass effect midline shift no extra-axial fluid collection. Vascular: No hyperdense vessel or unexpected calcification.  Skull: Normal. Negative for fracture or focal lesion. Other: None CT MAXILLOFACIAL FINDINGS Osseous: There is a fracture of the right nasal bone. No other acute fracture. No mandibular subluxation. Orbits: The globes and retro-orbital fat are preserved. Sinuses: Mild mucoperiosteal thickening of paranasal sinuses. No air-fluid level. The mastoid air cells are clear. Soft tissues: Mild soft tissue swelling over the nose. CT CERVICAL SPINE FINDINGS Alignment: No acute subluxation. Skull base and vertebrae: No acute fracture. Soft tissues and spinal canal: No prevertebral fluid or swelling. No visible canal hematoma. Disc levels: Degenerative changes with anterior osteophyte primarily at C3-C4 and C4-C5. Upper chest: Negative. Other: None IMPRESSION: 1. No acute intracranial pathology. 2. No acute/traumatic cervical spine pathology. 3. Right nasal bone fracture. Electronically Signed   By: Anner Crete M.D.   On: 01/18/2020 19:50   DG Hand Complete Right  Result Date: 01/30/2020 CLINICAL DATA:  Wound infection. EXAM: RIGHT HAND - COMPLETE 3+ VIEW COMPARISON:  01/18/2020 FINDINGS: The fifth digit PIP joint remains located. No acute fracture. Remote distal radius fracture with settling and ulnar positive variance. Remote boxer's fracture with healed angulation. Interphalangeal and MCP osteoarthritis. Extensive arterial calcification. No evidence of osteomyelitis. IMPRESSION: 1. No acute finding. 2. Chronic findings are described above. Electronically Signed   By: Monte Fantasia M.D.   On: 01/30/2020 06:35   DG Finger Little Right  Result Date: 01/18/2020 CLINICAL DATA:  Fall with swelling EXAM: RIGHT LITTLE FINGER 2+V COMPARISON:  None. FINDINGS: Dorsal and radial dislocation of the fifth middle phalanx with respect to the proximal phalanx. Suspected small fracture fragments adjacent to the head of the proximal phalanx. Vascular calcifications. IMPRESSION: Dorsal and radial dislocation of the fifth middle  phalanx with respect to the proximal phalanx with suspected small fracture fragments adjacent to the head of the proximal phalanx Electronically Signed   By: Donavan Foil M.D.   On: 01/18/2020 17:55   DG MINI C-ARM IMAGE ONLY  Result Date: 01/18/2020 There is no interpretation for this exam.  This order is for images obtained during a surgical procedure.  Please See "Surgeries" Tab for more information regarding the procedure.   [place] EKG SITE RITE  Result Date: 01/31/2020 If Site Rite image not attached, placement could not be confirmed due to current cardiac rhythm.  CT Maxillofacial Wo Contrast  Result Date: 01/18/2020 CLINICAL DATA:  64 year old male with facial trauma. EXAM: CT HEAD WITHOUT CONTRAST CT MAXILLOFACIAL WITHOUT CONTRAST CT CERVICAL SPINE WITHOUT CONTRAST TECHNIQUE: Multidetector CT imaging of the head, cervical spine, and maxillofacial structures were performed using the standard protocol without intravenous contrast. Multiplanar CT image reconstructions of the cervical spine and maxillofacial structures were also generated. COMPARISON:  Brain MRI  dated 06/20/2017. FINDINGS: CT HEAD FINDINGS Brain: The ventricles and sulci appropriate size for patient's age. 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 There is no acute intracranial hemorrhage. No mass effect midline shift no extra-axial fluid collection. Vascular: No hyperdense vessel or unexpected calcification. Skull: Normal. Negative for fracture or focal lesion. Other: None CT MAXILLOFACIAL FINDINGS Osseous: There is a fracture of the right nasal bone. No other acute fracture. No mandibular subluxation. Orbits: The globes and retro-orbital fat are preserved. Sinuses: Mild mucoperiosteal thickening of paranasal sinuses. No air-fluid level. The mastoid air cells are clear. Soft tissues: Mild soft tissue swelling over the nose. CT CERVICAL SPINE FINDINGS Alignment: No acute subluxation. Skull base and  vertebrae: No acute fracture. Soft tissues and spinal canal: No prevertebral fluid or swelling. No visible canal hematoma. Disc levels: Degenerative changes with anterior osteophyte primarily at C3-C4 and C4-C5. Upper chest: Negative. Other: None IMPRESSION: 1. No acute intracranial pathology. 2. No acute/traumatic cervical spine pathology. 3. Right nasal bone fracture. Electronically Signed   By: Anner Crete M.D.   On: 01/18/2020 19:50      Subjective: Patient seen and examined at bedside, resting comfortably.  Assisted with interpretation via video interpreter.  No complaints this morning.  Pain controlled.  Ready for discharge home.  Denies headache, no fever/chills/night sweats, no nausea/vomiting/diarrhea, no chest pain, no palpitations, no shortness of breath, no abdominal pain, no weakness.  No acute events overnight per nursing staff.  Discharge Exam: Vitals:   02/02/20 0448 02/02/20 0815  BP: 138/82 (!) 141/86  Pulse: 72 75  Resp: 18   Temp: 98 F (36.7 C)   SpO2: 93%    Vitals:   02/01/20 2028 02/02/20 0017 02/02/20 0448 02/02/20 0815  BP: (!) 161/91 132/80 138/82 (!) 141/86  Pulse: 76 76 72 75  Resp: $Remo'19 18 18   'YNNxx$ Temp: 97.8 F (36.6 C) 98.4 F (36.9 C) 98 F (36.7 C)   TempSrc: Oral Oral Oral   SpO2: 97% 94% 93%   Weight:      Height:        General: Pt is alert, awake, not in acute distress Cardiovascular: RRR, S1/S2 +, no rubs, no gallops Respiratory: CTA bilaterally, no wheezing, no rhonchi, on room air Abdominal: Soft, NT, ND, bowel sounds + Extremities: Left hand with splint/dressing in place, C/D/I, neurovascular intact; noted left BKA.    The results of significant diagnostics from this hospitalization (including imaging, microbiology, ancillary and laboratory) are listed below for reference.     Microbiology: Recent Results (from the past 240 hour(s))  MRSA PCR Screening     Status: None   Collection Time: 01/30/20 12:51 AM  Result Value Ref Range  Status   MRSA by PCR NEGATIVE NEGATIVE Final    Comment:        The GeneXpert MRSA Assay (FDA approved for NASAL specimens only), is one component of a comprehensive MRSA colonization surveillance program. It is not intended to diagnose MRSA infection nor to guide or monitor treatment for MRSA infections. Performed at [facility], [place] [address], [place], [place] [address]   Aerobic/Anaerobic Culture (surgical/deep wound)     Status: None (Preliminary result)   Collection Time: 01/30/20  8:45 PM   Specimen: Soft Tissue, Other  Result Value Ref Range Status   Specimen Description WOUND  Final   Special Requests RIGHT 5TH AND PINKY FINGER  Final   Gram Stain   Final    RARE WBC  PRESENT, PREDOMINANTLY MONONUCLEAR MODERATE GRAM POSITIVE COCCI IN PAIRS IN CHAINS ABUNDANT GRAM NEGATIVE RODS    Culture   Final    FEW ESCHERICHIA COLI ABUNDANT ENTEROCOCCUS FAECALIS CULTURE REINCUBATED FOR BETTER GROWTH HOLDING FOR POSSIBLE ANAEROBE Performed at [facility], [place] [address] [place], [place] [address]    Report Status PENDING  Incomplete  SARS Coronavirus 2 by RT PCR (hospital order, performed in [facility] hospital lab) Nasopharyngeal Nasopharyngeal Swab     Status: None   Collection Time: 02/01/20  4:44 PM   Specimen: Nasopharyngeal Swab  Result Value Ref Range Status   SARS Coronavirus 2 NEGATIVE NEGATIVE Final    Comment: (NOTE) SARS-CoV-2 target nucleic acids are NOT DETECTED.  The SARS-CoV-2 RNA is generally detectable in upper and lower respiratory specimens during the acute phase of infection. The lowest concentration of SARS-CoV-2 viral copies this assay can detect is 250 copies / mL. A negative result does not preclude SARS-CoV-2 infection and should not be used as the sole basis for treatment or other patient management decisions.  A negative result may occur with improper specimen collection / handling, submission of specimen other than nasopharyngeal  swab, presence of viral mutation(s) within the areas targeted by this assay, and inadequate number of viral copies (<250 copies / mL). A negative result must be combined with clinical observations, patient history, and epidemiological information.  Fact Sheet for Patients:   StrictlyIdeas.no  Fact Sheet for Healthcare Providers: BankingDealers.co.za  This test is not yet approved or  cleared by the [place] FDA and has been authorized for detection and/or diagnosis of SARS-CoV-2 by FDA under an Emergency Use Authorization (EUA).  This EUA will remain in effect (meaning this test can be used) for the duration of the COVID-19 declaration under Section 564(b)(1) of the Act, 21 U.S.C. section 360bbb-3(b)(1), unless the authorization is terminated or revoked sooner.  Performed at [facility], [place] [address], [place], [place] [address]      Labs: BNP (last 3 results) No results for input(s): BNP in the last 8760 hours. Basic Metabolic Panel: Recent Labs  Lab 01/30/20 0349 01/31/20 0246 02/01/20 0253 02/02/20 0215  NA 133* 134* 133* 133*  K 4.9 4.6 3.6 4.2  CL 100 100 100 100  CO2 $Re'24 23 25 'Ykt$ 21*  GLUCOSE 191* 238* 165* 285*  BUN $Re'10 9 8 8  'hak$ CREATININE 0.81 0.98 0.91 1.04  CALCIUM 9.1 8.6* 8.3* 8.4*  MG 1.8 1.6*  --   --   PHOS 3.6  --   --   --    Liver Function Tests: Recent Labs  Lab 01/30/20 0349  AST 19  ALT 17  ALKPHOS 69  BILITOT 0.7  PROT 6.6  ALBUMIN 3.0*   No results for input(s): LIPASE, AMYLASE in the last 168 hours. No results for input(s): AMMONIA in the last 168 hours. CBC: Recent Labs  Lab 01/30/20 0349 01/31/20 0246 02/01/20 0253 02/02/20 0215  WBC 7.4 8.8 6.6 7.9  HGB 13.6 13.1 13.2 13.1  HCT 38.4* 38.8* 36.4* 36.6*  MCV 87.3 89.2 86.1 86.5  PLT 345 349 372 362   Cardiac Enzymes: No results for input(s): CKTOTAL, CKMB, CKMBINDEX, TROPONINI in the last 168 hours. BNP: Invalid  input(s): POCBNP CBG: Recent Labs  Lab 02/01/20 1837 02/01/20 2027 02/02/20 0022 02/02/20 0421 02/02/20 0806  GLUCAP 90 160* 263* 272* 381*   D-Dimer No results for input(s): DDIMER in the last 72 hours. Hgb A1c No results for  input(s): HGBA1C in the last 72 hours. Lipid Profile No results for input(s): CHOL, HDL, LDLCALC, TRIG, CHOLHDL, LDLDIRECT in the last 72 hours. Thyroid function studies No results for input(s): TSH, T4TOTAL, T3FREE, THYROIDAB in the last 72 hours.  Invalid input(s): FREET3 Anemia work up No results for input(s): VITAMINB12, FOLATE, FERRITIN, TIBC, IRON, RETICCTPCT in the last 72 hours. Urinalysis    Component Value Date/Time   COLORURINE STRAW (A) 06/18/2017 2333   APPEARANCEUR CLEAR 06/18/2017 2333   LABSPEC 1.026 06/18/2017 2333   PHURINE 6.0 06/18/2017 2333   GLUCOSEU >=500 (A) 06/18/2017 2333   HGBUR NEGATIVE 06/18/2017 2333   BILIRUBINUR NEGATIVE 06/18/2017 2333   KETONESUR 5 (A) 06/18/2017 2333   PROTEINUR NEGATIVE 06/18/2017 2333   NITRITE NEGATIVE 06/18/2017 2333   LEUKOCYTESUR NEGATIVE 06/18/2017 2333   Sepsis Labs Invalid input(s): PROCALCITONIN,  WBC,  LACTICIDVEN Microbiology Recent Results (from the past 240 hour(s))  MRSA PCR Screening     Status: None   Collection Time: 01/30/20 12:51 AM  Result Value Ref Range Status   MRSA by PCR NEGATIVE NEGATIVE Final    Comment:        The GeneXpert MRSA Assay (FDA approved for NASAL specimens only), is one component of a comprehensive MRSA colonization surveillance program. It is not intended to diagnose MRSA infection nor to guide or monitor treatment for MRSA infections. Performed at [facility], [place] [address], [place], [place] [address]   Aerobic/Anaerobic Culture (surgical/deep wound)     Status: None (Preliminary result)   Collection Time: 01/30/20  8:45 PM   Specimen: Soft Tissue, Other  Result Value Ref Range Status   Specimen Description WOUND  Final   Special  Requests RIGHT 5TH AND PINKY FINGER  Final   Gram Stain   Final    RARE WBC PRESENT, PREDOMINANTLY MONONUCLEAR MODERATE GRAM POSITIVE COCCI IN PAIRS IN CHAINS ABUNDANT GRAM NEGATIVE RODS    Culture   Final    FEW ESCHERICHIA COLI ABUNDANT ENTEROCOCCUS FAECALIS CULTURE REINCUBATED FOR BETTER GROWTH HOLDING FOR POSSIBLE ANAEROBE Performed at [facility], [place] [address] [place], [place] [address]    Report Status PENDING  Incomplete  SARS Coronavirus 2 by RT PCR (hospital order, performed in [facility] hospital lab) Nasopharyngeal Nasopharyngeal Swab     Status: None   Collection Time: 02/01/20  4:44 PM   Specimen: Nasopharyngeal Swab  Result Value Ref Range Status   SARS Coronavirus 2 NEGATIVE NEGATIVE Final    Comment: (NOTE) SARS-CoV-2 target nucleic acids are NOT DETECTED.  The SARS-CoV-2 RNA is generally detectable in upper and lower respiratory specimens during the acute phase of infection. The lowest concentration of SARS-CoV-2 viral copies this assay can detect is 250 copies / mL. A negative result does not preclude SARS-CoV-2 infection and should not be used as the sole basis for treatment or other patient management decisions.  A negative result may occur with improper specimen collection / handling, submission of specimen other than nasopharyngeal swab, presence of viral mutation(s) within the areas targeted by this assay, and inadequate number of viral copies (<250 copies / mL). A negative result must be combined with clinical observations, patient history, and epidemiological information.  Fact Sheet for Patients:   StrictlyIdeas.no  Fact Sheet for Healthcare Providers: BankingDealers.co.za  This test is not yet approved or  cleared by the [place] FDA and has been authorized for detection and/or diagnosis of SARS-CoV-2 by FDA under an Emergency Use Authorization (EUA).  This EUA will remain in effect  (meaning this test can be used) for the duration of the COVID-19 declaration under Section 564(b)(1) of the Act, 21 U.S.C. section 360bbb-3(b)(1), unless the authorization is terminated or revoked sooner.  Performed at [facility], [place] [address], [place], [place] [address]      Time coordinating discharge: Over 30 minutes  SIGNED:   Eidan Muellner J [place] DO  [facility] 02/02/2020, 10:20 AM

## 2020-02-03 LAB — AEROBIC/ANAEROBIC CULTURE W GRAM STAIN (SURGICAL/DEEP WOUND)

## 2020-02-04 ENCOUNTER — Encounter (HOSPITAL_COMMUNITY): Payer: Self-pay | Admitting: Orthopaedic Surgery

## 2020-02-04 ENCOUNTER — Inpatient Hospital Stay (INDEPENDENT_AMBULATORY_CARE_PROVIDER_SITE_OTHER): Payer: Self-pay | Admitting: Primary Care

## 2020-02-04 NOTE — Anesthesia Postprocedure Evaluation (Signed)
Anesthesia Post Note  Patient: Ryan Mcbride  Procedure(s) Performed: Right small finger repeat irrigation and debridement (Right Finger)     Anesthesia Post Evaluation No complications documented.  Last Vitals:  Vitals:   02/02/20 1212 02/02/20 1712  BP: (!) 149/86 (!) 155/94  Pulse: 83 70  Resp: 18 18  Temp: 36.8 C 36.9 C  SpO2: 92% 96%    Last Pain:  Vitals:   02/02/20 1712  TempSrc: Oral  PainSc:                  Ryan Mcbride

## 2020-02-21 ENCOUNTER — Inpatient Hospital Stay (HOSPITAL_COMMUNITY)
Admission: EM | Admit: 2020-02-21 | Discharge: 2020-02-23 | DRG: 871 | Disposition: A | Discharge status: Home Health | Payer: Self-pay | Attending: Family Medicine | Admitting: Family Medicine

## 2020-02-21 ENCOUNTER — Encounter (HOSPITAL_COMMUNITY): Payer: Self-pay

## 2020-02-21 ENCOUNTER — Other Ambulatory Visit: Payer: Self-pay

## 2020-02-21 ENCOUNTER — Emergency Department (HOSPITAL_COMMUNITY): Payer: Self-pay

## 2020-02-21 DIAGNOSIS — Z20822 Contact with and (suspected) exposure to covid-19: Secondary | ICD-10-CM | POA: Diagnosis present

## 2020-02-21 DIAGNOSIS — D72819 Decreased white blood cell count, unspecified: Secondary | ICD-10-CM | POA: Diagnosis present

## 2020-02-21 DIAGNOSIS — Z7289 Other problems related to lifestyle: Secondary | ICD-10-CM

## 2020-02-21 DIAGNOSIS — R6520 Severe sepsis without septic shock: Secondary | ICD-10-CM | POA: Diagnosis present

## 2020-02-21 DIAGNOSIS — E872 Acidosis, unspecified: Secondary | ICD-10-CM

## 2020-02-21 DIAGNOSIS — M868X4 Other osteomyelitis, hand: Secondary | ICD-10-CM | POA: Diagnosis present

## 2020-02-21 DIAGNOSIS — Z794 Long term (current) use of insulin: Secondary | ICD-10-CM

## 2020-02-21 DIAGNOSIS — E876 Hypokalemia: Secondary | ICD-10-CM

## 2020-02-21 DIAGNOSIS — Z89512 Acquired absence of left leg below knee: Secondary | ICD-10-CM

## 2020-02-21 DIAGNOSIS — T360X6A Underdosing of penicillins, initial encounter: Secondary | ICD-10-CM | POA: Diagnosis present

## 2020-02-21 DIAGNOSIS — A4181 Sepsis due to Enterococcus: Principal | ICD-10-CM | POA: Diagnosis present

## 2020-02-21 DIAGNOSIS — Y92009 Unspecified place in unspecified non-institutional (private) residence as the place of occurrence of the external cause: Secondary | ICD-10-CM

## 2020-02-21 DIAGNOSIS — E1165 Type 2 diabetes mellitus with hyperglycemia: Secondary | ICD-10-CM | POA: Diagnosis present

## 2020-02-21 DIAGNOSIS — R400 Somnolence: Secondary | ICD-10-CM

## 2020-02-21 DIAGNOSIS — R4182 Altered mental status, unspecified: Secondary | ICD-10-CM

## 2020-02-21 DIAGNOSIS — Z79899 Other long term (current) drug therapy: Secondary | ICD-10-CM

## 2020-02-21 DIAGNOSIS — I10 Essential (primary) hypertension: Secondary | ICD-10-CM | POA: Diagnosis present

## 2020-02-21 DIAGNOSIS — R197 Diarrhea, unspecified: Secondary | ICD-10-CM

## 2020-02-21 DIAGNOSIS — Z91128 Patient's intentional underdosing of medication regimen for other reason: Secondary | ICD-10-CM

## 2020-02-21 DIAGNOSIS — G9341 Metabolic encephalopathy: Secondary | ICD-10-CM | POA: Diagnosis present

## 2020-02-21 DIAGNOSIS — E119 Type 2 diabetes mellitus without complications: Secondary | ICD-10-CM

## 2020-02-21 DIAGNOSIS — R932 Abnormal findings on diagnostic imaging of liver and biliary tract: Secondary | ICD-10-CM

## 2020-02-21 DIAGNOSIS — E1169 Type 2 diabetes mellitus with other specified complication: Secondary | ICD-10-CM | POA: Diagnosis present

## 2020-02-21 DIAGNOSIS — Z7984 Long term (current) use of oral hypoglycemic drugs: Secondary | ICD-10-CM

## 2020-02-21 DIAGNOSIS — R1110 Vomiting, unspecified: Secondary | ICD-10-CM

## 2020-02-21 DIAGNOSIS — Z89021 Acquired absence of right finger(s): Secondary | ICD-10-CM

## 2020-02-21 DIAGNOSIS — K2100 Gastro-esophageal reflux disease with esophagitis, without bleeding: Secondary | ICD-10-CM | POA: Diagnosis present

## 2020-02-21 DIAGNOSIS — A4151 Sepsis due to Escherichia coli [E. coli]: Secondary | ICD-10-CM | POA: Diagnosis present

## 2020-02-21 DIAGNOSIS — A419 Sepsis, unspecified organism: Principal | ICD-10-CM

## 2020-02-21 LAB — RESP PANEL BY RT-PCR (FLU A&B, COVID) ARPGX2
Influenza A by PCR: NEGATIVE
Influenza B by PCR: NEGATIVE
SARS Coronavirus 2 by RT PCR: NEGATIVE

## 2020-02-21 LAB — URINALYSIS, ROUTINE W REFLEX MICROSCOPIC
Bacteria, UA: NONE SEEN
Bilirubin Urine: NEGATIVE
Glucose, UA: >=500 mg/dL — AB
Ketones, ur: NEGATIVE mg/dL
Leukocytes,Ua: NEGATIVE
Nitrite: NEGATIVE
Protein, ur: 30 mg/dL — AB
Specific Gravity, Urine: 1.006 (ref 1.005–1.030)
pH: 5 (ref 5.0–8.0)

## 2020-02-21 LAB — COMPREHENSIVE METABOLIC PANEL
ALT: 15 U/L (ref 0–44)
AST: 23 U/L (ref 15–41)
Albumin: 3.6 g/dL (ref 3.5–5.0)
Alkaline Phosphatase: 94 U/L (ref 38–126)
Anion gap: 14 (ref 5–15)
BUN: 13 mg/dL (ref 8–23)
CO2: 15 mmol/L — ABNORMAL LOW (ref 22–32)
Calcium: 8.4 mg/dL — ABNORMAL LOW (ref 8.9–10.3)
Chloride: 107 mmol/L (ref 98–111)
Creatinine, Ser: 1.09 mg/dL (ref 0.61–1.24)
GFR, Estimated: >60 mL/min (ref >60)
Glucose, Bld: 262 mg/dL — ABNORMAL HIGH (ref 70–99)
Potassium: 3.2 mmol/L — ABNORMAL LOW (ref 3.5–5.1)
Sodium: 136 mmol/L (ref 135–145)
Total Bilirubin: 1.2 mg/dL (ref 0.3–1.2)
Total Protein: 6.9 g/dL (ref 6.5–8.1)

## 2020-02-21 LAB — LACTIC ACID, PLASMA
Lactic Acid, Venous: 4.1 mmol/L (ref 0.5–1.9)
Lactic Acid, Venous: 5.6 mmol/L (ref 0.5–1.9)
Lactic Acid, Venous: 2.5 mmol/L (ref 0.5–1.9)
Lactic Acid, Venous: 2.2 mmol/L (ref 0.5–1.9)

## 2020-02-21 LAB — BLOOD GAS, VENOUS
Acid-base deficit: 1.9 mmol/L (ref 0.0–2.0)
Bicarbonate: 23.8 mmol/L (ref 20.0–28.0)
FIO2: 97
O2 Saturation: 96.1 %
Patient temperature: 38.8
pCO2, Ven: 29.4 mmHg — ABNORMAL LOW (ref 44.0–60.0)
pH, Ven: 7.474 — ABNORMAL HIGH (ref 7.250–7.430)
pO2, Ven: 86 mmHg — ABNORMAL HIGH (ref 32.0–45.0)

## 2020-02-21 LAB — PROTIME-INR
INR: 1.3 — ABNORMAL HIGH (ref 0.8–1.2)
Prothrombin Time: 15.6 seconds — ABNORMAL HIGH (ref 11.4–15.2)

## 2020-02-21 LAB — TROPONIN I (HIGH SENSITIVITY)
Troponin I (High Sensitivity): 5 ng/L (ref <18)
Troponin I (High Sensitivity): 33 ng/L — ABNORMAL HIGH (ref <18)

## 2020-02-21 LAB — CBC WITH DIFFERENTIAL/PLATELET
Abs Immature Granulocytes: 0 10*3/uL (ref 0.00–0.07)
Basophils Absolute: 0 10*3/uL (ref 0.0–0.1)
Basophils Relative: 0 %
Eosinophils Absolute: 0 10*3/uL (ref 0.0–0.5)
Eosinophils Relative: 0 %
HCT: 43.4 % (ref 39.0–52.0)
Hemoglobin: 14.5 g/dL (ref 13.0–17.0)
Immature Granulocytes: 0 %
Lymphocytes Relative: 17 %
Lymphs Abs: 0.4 10*3/uL — ABNORMAL LOW (ref 0.7–4.0)
MCH: 29.7 pg (ref 26.0–34.0)
MCHC: 33.4 g/dL (ref 30.0–36.0)
MCV: 88.8 fL (ref 80.0–100.0)
Monocytes Absolute: 0 10*3/uL — ABNORMAL LOW (ref 0.1–1.0)
Monocytes Relative: 0 %
Neutro Abs: 1.9 10*3/uL (ref 1.7–7.7)
Neutrophils Relative %: 83 %
Platelets: 159 10*3/uL (ref 150–400)
RBC: 4.89 MIL/uL (ref 4.22–5.81)
RDW: 11.8 % (ref 11.5–15.5)
WBC: 2.4 10*3/uL — ABNORMAL LOW (ref 4.0–10.5)
nRBC: 0 % (ref 0.0–0.2)

## 2020-02-21 LAB — BETA-HYDROXYBUTYRIC ACID: Beta-Hydroxybutyric Acid: 1.78 mmol/L — ABNORMAL HIGH (ref 0.05–0.27)

## 2020-02-21 LAB — C DIFFICILE QUICK SCREEN W PCR REFLEX
C Diff antigen: NEGATIVE
C Diff interpretation: NOT DETECTED
C Diff toxin: NEGATIVE

## 2020-02-21 LAB — APTT: aPTT: 29 seconds (ref 24–36)

## 2020-02-21 LAB — CBG MONITORING, ED: Glucose-Capillary: 217 mg/dL — ABNORMAL HIGH (ref 70–99)

## 2020-02-21 LAB — MAGNESIUM: Magnesium: 1.1 mg/dL — ABNORMAL LOW (ref 1.7–2.4)

## 2020-02-21 LAB — ETHANOL: Alcohol, Ethyl (B): <10 mg/dL (ref <10)

## 2020-02-21 MED ORDER — LISINOPRIL 10 MG PO TABS
20.0000 mg | ORAL_TABLET | Freq: Every day | ORAL | Status: DC
  Administered 2020-02-22 – 2020-02-23 (×2): 20 mg via ORAL
  Filled 2020-02-21 (×2): qty 2

## 2020-02-21 MED ORDER — LACTATED RINGERS IV BOLUS (SEPSIS)
1000.0000 mL | Freq: Once | INTRAVENOUS | Status: AC
  Administered 2020-02-21: 16:00:00 1000 mL via INTRAVENOUS

## 2020-02-21 MED ORDER — PIPERACILLIN-TAZOBACTAM 3.375 G IVPB 30 MIN: 3.3750 g | Freq: Three times a day (TID) | INTRAVENOUS | Status: DC

## 2020-02-21 MED ORDER — ACETAMINOPHEN 650 MG RE SUPP: 650.0000 mg | Freq: Four times a day (QID) | RECTAL | Status: DC | PRN

## 2020-02-21 MED ORDER — PANTOPRAZOLE SODIUM 40 MG IV SOLR
40.0000 mg | Freq: Once | INTRAVENOUS | Status: AC
  Administered 2020-02-21: 15:00:00 40 mg via INTRAVENOUS
  Filled 2020-02-21: qty 40

## 2020-02-21 MED ORDER — ACETAMINOPHEN 325 MG PO TABS: 650.0000 mg | ORAL_TABLET | Freq: Four times a day (QID) | ORAL | Status: DC | PRN

## 2020-02-21 MED ORDER — LACTATED RINGERS IV BOLUS (SEPSIS)
250.0000 mL | Freq: Once | INTRAVENOUS | Status: AC
  Administered 2020-02-21: 16:00:00 250 mL via INTRAVENOUS

## 2020-02-21 MED ORDER — PIPERACILLIN-TAZOBACTAM 3.375 G IVPB 30 MIN: 3.3750 g | Freq: Once | INTRAVENOUS | Status: DC

## 2020-02-21 MED ORDER — LACTATED RINGERS IV SOLN: INTRAVENOUS | Status: AC

## 2020-02-21 MED ORDER — MAGNESIUM SULFATE 2 GM/50ML IV SOLN
2.0000 g | Freq: Once | INTRAVENOUS | Status: AC
  Administered 2020-02-21: 17:00:00 2 g via INTRAVENOUS
  Filled 2020-02-21: qty 50

## 2020-02-21 MED ORDER — IOHEXOL 350 MG/ML SOLN
75.0000 mL | Freq: Once | INTRAVENOUS | Status: AC | PRN
  Administered 2020-02-21: 75 mL via INTRAVENOUS

## 2020-02-21 MED ORDER — METRONIDAZOLE IN NACL 5-0.79 MG/ML-% IV SOLN
500.0000 mg | Freq: Once | INTRAVENOUS | Status: AC
  Administered 2020-02-21: 16:00:00 500 mg via INTRAVENOUS
  Filled 2020-02-21: qty 100

## 2020-02-21 MED ORDER — ACETAMINOPHEN 325 MG PO TABS
650.0000 mg | ORAL_TABLET | Freq: Once | ORAL | Status: AC
  Administered 2020-02-21: 16:00:00 650 mg via ORAL
  Filled 2020-02-21: qty 2

## 2020-02-21 MED ORDER — ENOXAPARIN SODIUM 40 MG/0.4ML ~~LOC~~ SOLN
40.0000 mg | SUBCUTANEOUS | Status: DC
  Administered 2020-02-21 – 2020-02-22 (×2): 40 mg via SUBCUTANEOUS
  Filled 2020-02-21 (×2): qty 0.4

## 2020-02-21 MED ORDER — POTASSIUM CHLORIDE CRYS ER 20 MEQ PO TBCR: 40.0000 meq | EXTENDED_RELEASE_TABLET | Freq: Once | ORAL | Status: DC

## 2020-02-21 MED ORDER — PIPERACILLIN-TAZOBACTAM 3.375 G IVPB
3.3750 g | Freq: Three times a day (TID) | INTRAVENOUS | Status: DC
  Administered 2020-02-22: 3.375 g via INTRAVENOUS
  Filled 2020-02-21: qty 50

## 2020-02-21 MED ORDER — POTASSIUM CHLORIDE 10 MEQ/100ML IV SOLN
10.0000 meq | INTRAVENOUS | Status: AC
Start: 2020-02-21 — End: 2020-02-22
  Administered 2020-02-22 (×2): 10 meq via INTRAVENOUS
  Filled 2020-02-21 (×2): qty 100

## 2020-02-21 MED ORDER — PROCHLORPERAZINE MALEATE 5 MG PO TABS: 10.0000 mg | ORAL_TABLET | Freq: Four times a day (QID) | ORAL | Status: DC | PRN

## 2020-02-21 MED ORDER — SODIUM CHLORIDE 0.9 % IV SOLN
2.0000 g | Freq: Once | INTRAVENOUS | Status: AC
  Administered 2020-02-21: 16:00:00 2 g via INTRAVENOUS
  Filled 2020-02-21: qty 2

## 2020-02-21 MED ORDER — VANCOMYCIN HCL 1750 MG/350ML IV SOLN: 1750.0000 mg | Freq: Once | INTRAVENOUS | Status: DC

## 2020-02-21 MED ORDER — ACETAMINOPHEN 650 MG RE SUPP
650.0000 mg | Freq: Once | RECTAL | Status: AC
  Administered 2020-02-21: 17:00:00 650 mg via RECTAL
  Filled 2020-02-21: qty 1

## 2020-02-21 NOTE — Progress Notes (Signed)
Current Year and date Birthdate Type of building City and state   All answered correctly on arrival.

## 2020-02-21 NOTE — H&P (Signed)
History and Physical  Mohd Clemons DPO:[id number] DOB: [birth year] DOA: 02/21/2020  Referring physician: Gari Crown PCP: Patient, [name]  Patient coming from: Home  Chief Complaint: Fever  HPI: Ritchard Paragas is a 64 y.o. 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  Patient complained of an episode of diarrhea last night and complained of subjective fever and shaking earlier today.  He denies any chest pain at bedside, though he complained of a resolved chest pain on arrival to the ED per ED medical record.  Rest of the history was obtained from ED physician and ED medical record.  Per report, patient has been doing well after his finger amputation and is receiving IV Zosyn through a PICC line multiple times per week (patient has missed the last 2 days of his antibiotics due to being in the process of moving).  Patient was reported to have an episode of shaking as well as vomiting (nonbloody).  Patient denies chest pain, shortness of breath, headache, abdominal pain or any discomfort at bedside.  ED Course:  In the emergency department, Patient was febrile with a temp of 103.64F, he was tachypneic and tachycardic.  Work-up in ED showed leukopenia, hypokalemia, hyperglycemia.  Lactic acid 4.1 >5.6 > 2.5.  Troponin 5 > 33, magnesium 1.1.  Urinalysis was negative, SARS coronavirus 2 was negative. CT angiography of chest with contrast showed no PE CT of head without contrast showed no acute intracranial abnormality Chest x-ray showed no acute airspace disease Right hand x-ray showed amputation of the fifth finger with no residual recurrent  osteomyelitis. He was treated with Tylenol, patient was empirically treated with IV cefepime and metronidazole.  Protonix was given and patient was prior with IV hydration per sepsis protocol.  Hospitalist was asked to admit patient for further evaluation and management.  Review of Systems: Constitutional: Positive for chills and fever.  HENT: Negative for ear pain and sore throat.   Eyes: Negative for pain and visual disturbance.  Respiratory: Negative for cough, chest tightness and shortness of breath.   Cardiovascular: Negative for chest pain and palpitations.  Gastrointestinal: Negative for abdominal pain and vomiting.  Endocrine: Negative for polyphagia and polyuria.  Genitourinary: Negative for decreased urine volume, dysuria, enuresis Musculoskeletal: Negative for arthralgias and back pain.  Skin: Negative for color change and rash.  Allergic/Immunologic: Negative for immunocompromised state.  Neurological: Negative for tremors, syncope, speech difficulty, weakness, light-headedness and headaches.  Hematological: Does not bruise/bleed easily.  All other systems reviewed and are negative  Past Medical History:  Diagnosis Date  . Alcohol abuse   . Diabetes mellitus without complication ([place])   . Hypertension   . Osteomyelitis [facility])    Past Surgical History:  Procedure Laterality Date  . amputation left leg    . I & D EXTREMITY Right 01/18/2020   Procedure: IRRIGATION AND DEBRIDEMENT SMALL FINGER  AND OPEN FRACTURE,,OPEN REDUCTION OF RIGHT PIP JOINT;  Surgeon: Verner Mould, MD;  Location: [place];  Service: Orthopedics;  Laterality: Right;  . INCISION AND DRAINAGE OF WOUND Right 01/30/2020   Procedure: Right small finger revision amputation, irrigation and debridement with wound vac placement;  Surgeon: Avanell Shackleton III,  MD;  Location: [facility] OR;  Service: Orthopedics;  Laterality: Right;   . INCISION AND DRAINAGE OF WOUND Right 02/01/2020   Procedure: Right small  finger repeat irrigation and debridement;  Surgeon: Ernest Mallick, MD;  Location: [facility] OR;  Service: Orthopedics;  Laterality: Right;     Social History:  reports that he has never smoked. He has never used smokeless tobacco. He reports current alcohol use. He reports that he does not use drugs.   No Known Allergies  No family history on file.    Prior to Admission medications   Medication Sig Start Date End Date Taking? Authorizing Provider  folic acid (FOLVITE) 1 MG tablet Take 1 tablet (1 mg total) by mouth daily. 01/22/20  Yes Regalado, Belkys A, MD  insulin aspart protamine- aspart (NOVOLOG MIX 70/30) (70-30) 100 UNIT/ML injection Inject 0.14 mLs (14 Units total) into the skin 2 (two) times daily with a meal. 01/22/20  Yes Regalado, Belkys A, MD  Insulin Syringe 27G X 1/2" 1 ML MISC 1 application by Does not apply route 2 (two) times daily. 01/22/20  Yes Regalado, Belkys A, MD  lisinopril (ZESTRIL) 20 MG tablet Take 1 tablet (20 mg total) by mouth daily. 01/22/20  Yes Regalado, Belkys A, MD  metFORMIN (GLUCOPHAGE) 1000 MG tablet Take 1 tablet (1,000 mg total) by mouth 2 (two) times daily with a meal. 01/22/20  Yes Regalado, Belkys A, MD  Multiple Vitamin (MULTIVITAMIN WITH MINERALS) TABS tablet Take 1 tablet by mouth daily. 01/22/20  Yes Regalado, Belkys A, MD  piperacillin-tazobactam (ZOSYN) IVPB Inject 3.375 g into the vein every 8 (eight) hours for 28 days. As a continuous infusion. Indication:  Osteomyelitis of right hand  First Dose: Yes Last Day of Therapy:  02/29/2020 Labs - Once weekly:  CBC/D and BMP, Labs - Every other week:  ESR and CRP Method of administration: Elastomeric (Continuous infusion) Method of administration may be changed at the discretion of home infusion pharmacist based upon assessment of the patient and/or caregiver's ability to self-administer the medication ordered. 02/01/20 02/29/20 Yes [place], Eric J, DO  polyethylene glycol (MIRALAX /  GLYCOLAX) 17 g packet Take 17 g by mouth daily as needed for mild constipation. 01/22/20  Yes Regalado, Belkys A, MD  thiamine 100 MG tablet Take 1 tablet (100 mg total) by mouth daily. 01/23/20  Yes Regalado, Prentiss Bells, MD    Physical Exam: BP 113/75   Pulse 89   Temp (!) 101.3 F (38.5 C) (Rectal)   Resp (!) 26   Ht 5\' 5"  (1.651 m)   Wt 72.6 kg   SpO2 97%   BMI 26.63 kg/m   . General: 64 y.o. year-old male well developed well nourished in no acute distress.  Alert and oriented x3. 77 HEENT: NCAT, EOMI . Neck: Supple, trachea medial . Cardiovascular: Tachycardia.  Regular rate and rhythm with no rubs or gallops.  No thyromegaly or JVD noted.   Marland Kitchen Respiratory: Tachypnea.  Clear to auscultation with no wheezes or rales. Good inspiratory effort. . Abdomen: Soft nontender nondistended with normal bowel sounds x4 quadrants. . Muskuloskeletal: Left BKA.  Right fifth finger amputation.  . Neuro: CN II-XII intact, strength, sensation, reflexes . Skin: No ulcerative lesions noted or rashes . Psychiatry: Judgement and insight appear normal. Mood is appropriate for condition and setting          Labs on Admission:  Basic Metabolic Panel: Recent Labs  Lab 02/21/20 1535 02/21/20 1537  NA  136  --   K 3.2*  --   CL 107  --   CO2 15*  --   GLUCOSE 262*  --   BUN 13  --   CREATININE 1.09  --   CALCIUM 8.4*  --   MG  --  1.1*   Liver Function Tests: Recent Labs  Lab 02/21/20 1535  AST 23  ALT 15  ALKPHOS 94  BILITOT 1.2  PROT 6.9  ALBUMIN 3.6   No results for input(s): LIPASE, AMYLASE in the last 168 hours. No results for input(s): AMMONIA in the last 168 hours. CBC: Recent Labs  Lab 02/21/20 1535  WBC 2.4*  NEUTROABS 1.9  HGB 14.5  HCT 43.4  MCV 88.8  PLT 159   Cardiac Enzymes: No results for input(s): CKTOTAL, CKMB, CKMBINDEX, TROPONINI in the last 168 hours.  BNP (last 3 results) No results for input(s): BNP in the last 8760 hours.  ProBNP (last 3 results) No  results for input(s): PROBNP in the last 8760 hours.  CBG: Recent Labs  Lab 02/21/20 1423  GLUCAP 217*    Radiological Exams on Admission: DG Chest 2 View  Result Date: 02/21/2020 CLINICAL DATA:  Fever, history of hypertension EXAM: CHEST - 2 VIEW COMPARISON:  06/20/2017 FINDINGS: Frontal and lateral views of the chest demonstrates stable enlargement of the cardiac silhouette. No airspace disease, effusion, or pneumothorax. No acute bony abnormalities. IMPRESSION: 1. Stable enlarged cardiac silhouette. 2. No acute airspace disease. Electronically Signed   By: Randa Ngo M.D.   On: 02/21/2020 17:00   CT Head Wo Contrast  Result Date: 02/21/2020 CLINICAL DATA:  Altered mental status. EXAM: CT HEAD WITHOUT CONTRAST TECHNIQUE: Contiguous axial images were obtained from the base of the skull through the vertex without intravenous contrast. COMPARISON:  CT head dated January 18, 2020. FINDINGS: Brain: No evidence of acute infarction, hemorrhage, hydrocephalus, extra-axial collection or mass lesion/mass effect. Stable mild atrophy. 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 Vascular: Atherosclerotic vascular calcification of the carotid siphons. No hyperdense vessel. Skull: Normal. Negative for fracture or focal lesion. Sinuses/Orbits: No acute finding. Other: None. IMPRESSION: 1. No acute intracranial abnormality. Electronically Signed   By: Titus Dubin M.D.   On: 02/21/2020 17:03   CT Angio Chest PE W and/or Wo Contrast  Result Date: 02/21/2020 CLINICAL DATA:  PE suspected, high prob Fever. EXAM: CT ANGIOGRAPHY CHEST WITH CONTRAST TECHNIQUE: Multidetector CT imaging of the chest was performed using the standard protocol during bolus administration of intravenous contrast. Multiplanar CT image reconstructions and MIPs were obtained to evaluate the vascular anatomy. CONTRAST:  50mL OMNIPAQUE IOHEXOL 350 MG/ML SOLN COMPARISON:  Radiograph earlier today. FINDINGS: Cardiovascular: There are  no filling defects within the pulmonary arteries to suggest pulmonary embolus. Thoracic aorta is normal in caliber. No dissection or acute aortic findings. Upper normal heart size. There are coronary artery calcifications. No pericardial effusion. There is a left upper extremity PICC with tip obscured by dense IV contrast in the IVC. Mediastinum/Nodes: Small mediastinal and hilar lymph nodes are not enlarged by size criteria. Patulous esophagus with occasional areas of wall thickening. No visualized thyroid nodule. Lungs/Pleura: Hypoventilatory changes in the dependent lungs. No confluent consolidation or focal airspace disease. No findings of pulmonary edema. Trachea and central bronchi are patent. No pleural fluid. No pulmonary mass. Upper Abdomen: Motion artifact limits assessment. Equivocal wall thickening and fat stranding about the gallbladder versus motion. Fluid-filled stomach. Musculoskeletal: There are no acute or suspicious osseous abnormalities. Mild  thoracic spondylosis. Review of the MIP images confirms the above findings. IMPRESSION: 1. No pulmonary embolus. 2. Hypoventilatory change in the dependent lungs without focal airspace disease. 3. Patulous esophagus with occasional areas of wall thickening, can be seen with reflux or esophagitis. 4. Equivocal wall thickening and fat stranding about the gallbladder in the upper abdomen versus motion artifact. Recommend correlation with clinical symptoms. 5. Coronary artery calcifications. Aortic Atherosclerosis (ICD10-I70.0). Electronically Signed   By: Keith Rake M.D.   On: 02/21/2020 18:33   DG Hand Complete Right  Result Date: 02/21/2020 CLINICAL DATA:  History of osteomyelitis EXAM: RIGHT HAND - COMPLETE 3+ VIEW COMPARISON:  01/30/2020 FINDINGS: Interval amputation of the fifth finger at the MCP level. No evidence of residual osteomyelitis. There is deformity of the proximal fifth metacarpal which is unchanged and appears to be due to healed  fracture. Mild degenerative change in the first MCP. Chronic irregularity of the ulnar styloid may be posttraumatic. No fracture or osteomyelitis. Extensive arterial calcification. IMPRESSION: Amputation of the fifth finger. No residual or recurrent osteomyelitis. Electronically Signed   By: Franchot Gallo M.D.   On: 02/21/2020 16:59    EKG: I independently viewed the EKG done and my findings are as followed: Sinus tachycardia with rate of 110 bpm  Assessment/Plan Present on Admission: . Severe sepsis ([place]) . Essential hypertension  Principal Problem:   Severe sepsis (HCC) Active Problems:   Hyperglycemia due to diabetes mellitus (H[facility])   Essential hypertension   Type 2 diabetes mellitus ([facility])   Vomiting   Diarrhea   Lactic acidosis   Reflux esophagitis   Leukopenia   Hypokalemia  Altered mental status secondary to severe sepsis with unknown source at this time Lactic acidosis in the setting of above Altered mental status have resolved since arrival to the ED Patient met SIRS criteria due to tachypnea, tachycardia, fever, leukopenia (meets SIRS criteria), lactic acid (4.1 > 5.6).  PICC line does not appear to be infected at this time He was treated with IV cefepime and Flagyl, we shall continue with IV Zosyn Patient was already on IV Zosyn 3.375 g every 8 hours due to recurrent right hand osteomyelitis s/p amputation of right fifth finger (does not appear to be infected at this time) Continue Tylenol 650 mg every 6 hours as needed for fever Continue IV hydration Blood culture pending Operative culture on 01/30/2020 showed  E. coli and abundant Enterococcus faecalis  Leukopenia WBC 2.4; continue treatment as per above  Vomiting and diarrhea Continue IV Compazine 10 mg every 6 hours as needed C. diff and GI panel will be checked and patient will be treated accordingly  Hypokalemia and hypomagnesemia K+ 3.2, Mg 1.1; these may be due to loss of electrolytes secondary to  diarrhea K+ and magnesium will be replenished  Hyperglycemia secondary to type 2 diabetes mellitus Continue insulin sliding scale and hypoglycemia protocol Metformin will be held at this time  Reflux esophagitis Continue Protonix  Essential hypertension Continue lisinopril as tolerated by BP  DVT prophylaxis: Lovenox  Code Status: Full code  Family Communication: None at bedside  Disposition Plan:  Patient is from:                        home Anticipated DC to:                   home Anticipated DC date:  2-3 days Anticipated DC barriers:         Patient is unstable to be discharged at this time due to sepsis that require inpatient management   Consults called: None  Admission status: Inpatient   Bernadette Hoit MD [facility]  02/21/2020, 10:12 PM

## 2020-02-21 NOTE — Progress Notes (Signed)
Elink following Code Sepsis. 

## 2020-02-21 NOTE — ED Triage Notes (Signed)
Pt brought to ED via [facility] for fever, Pt states "it wasn't that high." Pt on IV antibiotics at home, Zosyn 3.375 mg IV for right pinky amputation. Pt received 500 ml NS PTA.

## 2020-02-21 NOTE — ED Provider Notes (Signed)
[facility] EMERGENCY DEPARTMENT Provider Note   CSN: [id number] Arrival date & time: 02/21/20  1415     History Chief Complaint  Patient presents with  . Fever    Ryan Mcbride is a 64 y.o. male history of alcohol abuse, diabetes, osteomyelitis, recent amputation of the right fifth finger, hypertension.  History obtained from patient with use of video interpreter: Patient reports that he was shaking and had a fever earlier today he does not know how high his temperature was.  He reported around the same time he had a chest pain which he is unable to describe, he reports he is now chest pain-free does not know how long his chest pain lasted and does not know if he had similar pain before.  He denies any drug or alcohol use reports he is otherwise feeling well.  He reports that he is currently receiving p.o. vancomycin but is not sure why.  Patient reports that the year is 62, he does not know what city he is in but knows that he is in the hospital.  During interview with video interpreter there were multiple occasions where the interpreter did not understand what the patient was saying or that patient was answering questions completely unrelated to what the interpreter was asking.  Patient with copious diarrhea in pants.  Level 5 caveat altered mental status. ----------------- History obtained from patient's daughter Ryan Mcbride: Yes she reports that patient has been doing well after his finger amputation and is currently receiving IV Zosyn through a PICC line multiple times a week.  She reports that he has missed the last 2 days of his medication as they are currently in the process of moving.  She reports that the patient had an episode of shaking and one episode of emesis just before she called EMS and this was new.  She reports the patient was not complaining of any pain during that time.  She reports the patient has not been drinking any alcohol and mental status has been baseline.  She denies  that he has been confused with her.  I asked Ryan Mcbride if patient has been experiencing diarrhea she was not aware of this.  Reports patient normally fully alert and oriented and helps take care of himself.  No history recent fall/injury no fever prior to today, no history of chest pain, abdominal pain or any additional concerns  HPI     Past Medical History:  Diagnosis Date  . Alcohol abuse   . Diabetes mellitus without complication ([place])   . Hypertension   . Osteomyelitis [facility])     Patient Active Problem List   Diagnosis Date Noted  . Osteomyelitis of finger ([place])   . Wound infection 01/30/2020  . Type 2 diabetes mellitus ([place]) 01/30/2020  . Cellulitis of finger of right hand 01/18/2020  . Closed fracture dislocation of proximal interphalangeal (PIP) joint of finger 01/18/2020  . Uncontrolled type 2 diabetes mellitus with hyperglycemia, without long-term current use of insulin ([place]) 01/18/2020  . Essential hypertension 01/18/2020  . Alcohol abuse 01/18/2020  . Seizure [facility])     Past Surgical History:  Procedure Laterality Date  . amputation left leg    . I & D EXTREMITY Right 01/18/2020   Procedure: IRRIGATION AND DEBRIDEMENT SMALL FINGER  AND OPEN FRACTURE,,OPEN REDUCTION OF RIGHT PIP JOINT;  Surgeon: Verner Mould, MD;  Location: [place];  Service: Orthopedics;  Laterality: Right;  . INCISION AND DRAINAGE OF WOUND Right 01/30/2020   Procedure: Right  small finger revision amputation, irrigation and debridement with wound vac placement;  Surgeon: Verner Mould, MD;  Location: [place];  Service: Orthopedics;  Laterality: Right;  7mn  . INCISION AND DRAINAGE OF WOUND Right 02/01/2020   Procedure: Right small finger repeat irrigation and debridement;  Surgeon: CVerner Mould MD;  [place]  Service: Orthopedics;  Laterality: Right;  667m       No family history on file.  Social History   Tobacco Use  . Smoking status: Never Smoker  .  Smokeless tobacco: Never Used  Substance Use Topics  . Alcohol use: Yes  . Drug use: Never    Home Medications Prior to Admission medications   Medication Sig Start Date End Date Taking? Authorizing Provider  folic acid (FOLVITE) 1 MG tablet Take 1 tablet (1 mg total) by mouth daily. 01/22/20   Regalado, Belkys A, MD  insulin aspart protamine- aspart (NOVOLOG MIX 70/30) (70-30) 100 UNIT/ML injection Inject 0.14 mLs (14 Units total) into the skin 2 (two) times daily with a meal. 01/22/20   Regalado, Belkys A, MD  Insulin Syringe 27G X 1/2" 1 ML MISC 1 application by Does not apply route 2 (two) times daily. 01/22/20   Regalado, Belkys A, MD  lisinopril (ZESTRIL) 20 MG tablet Take 1 tablet (20 mg total) by mouth daily. 01/22/20   Regalado, Belkys A, MD  metFORMIN (GLUCOPHAGE) 1000 MG tablet Take 1 tablet (1,000 mg total) by mouth 2 (two) times daily with a meal. 01/22/20   Regalado, Belkys A, MD  Multiple Vitamin (MULTIVITAMIN WITH MINERALS) TABS tablet Take 1 tablet by mouth daily. 01/22/20   Regalado, Belkys A, MD  piperacillin-tazobactam (ZOSYN) IVPB Inject 3.375 g into the vein every 8 (eight) hours for 28 days. As a continuous infusion. Indication:  Osteomyelitis of right hand  First Dose: Yes Last Day of Therapy:  02/29/2020 Labs - Once weekly:  CBC/D and BMP, Labs - Every other week:  ESR and CRP Method of administration: Elastomeric (Continuous infusion) Method of administration may be changed at the discretion of home infusion pharmacist based upon assessment of the patient and/or caregiver's ability to self-administer the medication ordered. 02/01/20 02/29/20  [place]ErDonnamarie PoagDO  polyethylene glycol (MIRALAX / GLYCOLAX) 17 g packet Take 17 g by mouth daily as needed for mild constipation. 01/22/20   Regalado, Belkys A, MD  thiamine 100 MG tablet Take 1 tablet (100 mg total) by mouth daily. 01/23/20   Regalado, BeCassie FreerMD    Allergies    Patient has no known allergies.  Review of  Systems   Review of Systems  Unable to perform ROS: Mental status change    Physical Exam Updated Vital Signs BP 124/79   Pulse 98   Temp (!) 101.3 F (38.5 C) (Rectal)   Resp (!) 31   Ht _0  (1.651 m)   Wt 72.6 kg   SpO2 97%   BMI 26.63 kg/m   Physical Exam Constitutional:      General: He is not in acute distress.    Appearance: Normal appearance. He is well-developed. He is not ill-appearing or diaphoretic.  HENT:     Head: Normocephalic and atraumatic.  Eyes:     General: Vision grossly intact. Gaze aligned appropriately.     Pupils: Pupils are equal, round, and reactive to light.  Neck:     Trachea: Trachea and phonation normal.  Cardiovascular:     Rate and Rhythm: Regular rhythm.  Tachycardia present.  Pulmonary:     Effort: Pulmonary effort is normal. No respiratory distress.  Abdominal:     General: There is no distension.     Palpations: Abdomen is soft.     Tenderness: There is no abdominal tenderness. There is no guarding or rebound.  Musculoskeletal:        General: Normal range of motion.     Cervical back: Normal range of motion.  Skin:    General: Skin is warm and dry.  Neurological:     Mental Status: He is alert.     GCS: GCS eye subscore is 4. GCS verbal subscore is 4. GCS motor subscore is 6.     Comments: Speech is clear, confused, follows commands Major Cranial nerves without deficit, no facial droop Moves extremities without ataxia, coordination intact  Psychiatric:        Behavior: Behavior normal.     ED Results / Procedures / Treatments   Labs (all labs ordered are listed, but only abnormal results are displayed) Labs Reviewed  LACTIC ACID, PLASMA - Abnormal; Notable for the following components:      Result Value   Lactic Acid, Venous 4.1 (*)    All other components within normal limits  LACTIC ACID, PLASMA - Abnormal; Notable for the following components:   Lactic Acid, Venous 5.6 (*)    All other components within normal  limits  COMPREHENSIVE METABOLIC PANEL - Abnormal; Notable for the following components:   Potassium 3.2 (*)    CO2 15 (*)    Glucose, Bld 262 (*)    Calcium 8.4 (*)    All other components within normal limits  CBC WITH DIFFERENTIAL/PLATELET - Abnormal; Notable for the following components:   WBC 2.4 (*)    Lymphs Abs 0.4 (*)    Monocytes Absolute 0.0 (*)    All other components within normal limits  URINALYSIS, ROUTINE W REFLEX MICROSCOPIC - Abnormal; Notable for the following components:   Glucose, UA >=500 (*)    Hgb urine dipstick SMALL (*)    Protein, ur 30 (*)    All other components within normal limits  PROTIME-INR - Abnormal; Notable for the following components:   Prothrombin Time 15.6 (*)    INR 1.3 (*)    All other components within normal limits  MAGNESIUM - Abnormal; Notable for the following components:   Magnesium 1.1 (*)    All other components within normal limits  CBG MONITORING, ED - Abnormal; Notable for the following components:   Glucose-Capillary 217 (*)    All other components within normal limits  RESP PANEL BY RT-PCR (FLU A&B, COVID) ARPGX2  CULTURE, BLOOD (ROUTINE X 2)  C DIFFICILE QUICK SCREEN W PCR REFLEX  CULTURE, BLOOD (ROUTINE X 2)  GASTROINTESTINAL PANEL BY PCR, STOOL (REPLACES STOOL CULTURE)  URINE CULTURE  ETHANOL  APTT  BETA-HYDROXYBUTYRIC ACID  BLOOD GAS, VENOUS  TROPONIN I (HIGH SENSITIVITY)  TROPONIN I (HIGH SENSITIVITY)    EKG EKG Interpretation  Date/Time:  Thursday February 21 2020 14:23:39 EST Ventricular Rate:  110 PR Interval:    QRS Duration: 116 QT Interval:  367 QTC Calculation: 497 R Axis:   108 Text Interpretation: Sinus tachycardia Nonspecific intraventricular conduction delay Low voltage with right axis deviation Borderline ST depression, diffuse leads Since last tracing rate faster Otherwise no significant change Confirmed by Daleen Bo [phone]) on 02/21/2020 3:33:01 PM   Radiology DG Chest 2  View  Result Date: 02/21/2020 CLINICAL DATA:  Fever,  history of hypertension EXAM: CHEST - 2 VIEW COMPARISON:  06/20/2017 FINDINGS: Frontal and lateral views of the chest demonstrates stable enlargement of the cardiac silhouette. No airspace disease, effusion, or pneumothorax. No acute bony abnormalities. IMPRESSION: 1. Stable enlarged cardiac silhouette. 2. No acute airspace disease. Electronically Signed   By: Randa Ngo M.D.   On: 02/21/2020 17:00   CT Head Wo Contrast  Result Date: 02/21/2020 CLINICAL DATA:  Altered mental status. EXAM: CT HEAD WITHOUT CONTRAST TECHNIQUE: Contiguous axial images were obtained from the base of the skull through the vertex without intravenous contrast. COMPARISON:  CT head dated January 18, 2020. FINDINGS: Brain: No evidence of acute infarction, hemorrhage, hydrocephalus, extra-axial collection or mass lesion/mass effect. Stable mild atrophy. 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 Vascular: Atherosclerotic vascular calcification of the carotid siphons. No hyperdense vessel. Skull: Normal. Negative for fracture or focal lesion. Sinuses/Orbits: No acute finding. Other: None. IMPRESSION: 1. No acute intracranial abnormality. Electronically Signed   By: Titus Dubin M.D.   On: 02/21/2020 17:03   CT Angio Chest PE W and/or Wo Contrast  Result Date: 02/21/2020 CLINICAL DATA:  PE suspected, high prob Fever. EXAM: CT ANGIOGRAPHY CHEST WITH CONTRAST TECHNIQUE: Multidetector CT imaging of the chest was performed using the standard protocol during bolus administration of intravenous contrast. Multiplanar CT image reconstructions and MIPs were obtained to evaluate the vascular anatomy. CONTRAST:  35m OMNIPAQUE IOHEXOL 350 MG/ML SOLN COMPARISON:  Radiograph earlier today. FINDINGS: Cardiovascular: There are no filling defects within the pulmonary arteries to suggest pulmonary embolus. Thoracic aorta is normal in caliber. No dissection or acute aortic findings.  Upper normal heart size. There are coronary artery calcifications. No pericardial effusion. There is a left upper extremity PICC with tip obscured by dense IV contrast in the IVC. Mediastinum/Nodes: Small mediastinal and hilar lymph nodes are not enlarged by size criteria. Patulous esophagus with occasional areas of wall thickening. No visualized thyroid nodule. Lungs/Pleura: Hypoventilatory changes in the dependent lungs. No confluent consolidation or focal airspace disease. No findings of pulmonary edema. Trachea and central bronchi are patent. No pleural fluid. No pulmonary mass. Upper Abdomen: Motion artifact limits assessment. Equivocal wall thickening and fat stranding about the gallbladder versus motion. Fluid-filled stomach. Musculoskeletal: There are no acute or suspicious osseous abnormalities. Mild thoracic spondylosis. Review of the MIP images confirms the above findings. IMPRESSION: 1. No pulmonary embolus. 2. Hypoventilatory change in the dependent lungs without focal airspace disease. 3. Patulous esophagus with occasional areas of wall thickening, can be seen with reflux or esophagitis. 4. Equivocal wall thickening and fat stranding about the gallbladder in the upper abdomen versus motion artifact. Recommend correlation with clinical symptoms. 5. Coronary artery calcifications. Aortic Atherosclerosis (ICD10-I70.0). Electronically Signed   By: MKeith RakeM.D.   On: 02/21/2020 18:33   DG Hand Complete Right  Result Date: 02/21/2020 CLINICAL DATA:  History of osteomyelitis EXAM: RIGHT HAND - COMPLETE 3+ VIEW COMPARISON:  01/30/2020 FINDINGS: Interval amputation of the fifth finger at the MCP level. No evidence of residual osteomyelitis. There is deformity of the proximal fifth metacarpal which is unchanged and appears to be due to healed fracture. Mild degenerative change in the first MCP. Chronic irregularity of the ulnar styloid may be posttraumatic. No fracture or osteomyelitis. Extensive  arterial calcification. IMPRESSION: Amputation of the fifth finger. No residual or recurrent osteomyelitis. Electronically Signed   By: CFranchot GalloM.D.   On: 02/21/2020 16:59    Procedures .Critical Care Performed by: MDeliah Boston  PA-C Authorized by: Deliah Boston, PA-C   Critical care provider statement:    Critical care time (minutes):  50   Critical care was necessary to treat or prevent imminent or life-threatening deterioration of the following conditions:  Sepsis   Critical care was time spent personally by me on the following activities:  Discussions with consultants, evaluation of patient's response to treatment, examination of patient, ordering and performing treatments and interventions, ordering and review of laboratory studies, ordering and review of radiographic studies, pulse oximetry, re-evaluation of patient's condition, obtaining history from patient or surrogate, review of old charts and development of treatment plan with patient or surrogate   (including critical care time)  Medications Ordered in ED Medications  lactated ringers infusion ( Intravenous New Bag/Given 02/21/20 1532)  pantoprazole (PROTONIX) injection 40 mg (40 mg Intravenous Given 02/21/20 1516)  lactated ringers bolus 1,000 mL (0 mLs Intravenous Stopped 02/21/20 1718)    And  lactated ringers bolus 1,000 mL (0 mLs Intravenous Stopped 02/21/20 1833)    And  lactated ringers bolus 250 mL (0 mLs Intravenous Stopped 02/21/20 1718)  acetaminophen (TYLENOL) tablet 650 mg (650 mg Oral Given 02/21/20 1538)  ceFEPIme (MAXIPIME) 2 g in sodium chloride 0.9 % 100 mL IVPB (0 g Intravenous Stopped 02/21/20 1612)  metroNIDAZOLE (FLAGYL) IVPB 500 mg (0 mg Intravenous Stopped 02/21/20 1719)  acetaminophen (TYLENOL) suppository 650 mg (650 mg Rectal Given 02/21/20 1659)  magnesium sulfate IVPB 2 g 50 mL (0 g Intravenous Stopped 02/21/20 1833)  iohexol (OMNIPAQUE) 350 MG/ML injection 75 mL (75 mLs  Intravenous Contrast Given 02/21/20 1814)    ED Course  I have reviewed the triage vital signs and the nursing notes.  Pertinent labs & imaging results that were available during my care of the patient were reviewed by me and considered in my medical decision making (see chart for details).  Clinical Course as of 02/21/20 1922  Thu Feb 21, 2020  1446 Ryan Mcbride [BM]    Clinical Course User Index [BM] Gari Crown    MDM Rules/Calculators/A&P                         Additional history obtained from: 1. Nursing notes from this visit. 2. Patient's family member. 3. Review electronic medical records. -------------------------- On initial evaluation 64 year old male resting comfortably in bed no acute distress.  He is covered in stool, mildly tachycardic afebrile blood pressure stable.  He seems confused answering questions inappropriately via Patent attorney.  He has no cranial nerve deficit moves bilateral extremities with equal strength.  Cardiopulmonary exam unremarkable aside from tachycardia.  Abdomen soft nontender.  He has a vague complaint of questionable chest pain earlier which has resolved.  Patient does not meet SIRS criteria at this time will obtain screening labs.  He has a history of alcohol abuse will give Protonix for possible GERD as cause of earlier chest pain?  We will continue to monitor.  History obtained from patient's daughter is a consistent with that provided by patient today apparently had some shaking earlier and one episode of vomiting.  Will obtain CT head for evaluation of altered mental status along with labs, chest x-ray.  Will also obtain x-ray of the right hand given recent osteomyelitis. COVID test ordered. - Rectal temp shows fever 102.2 F patient now meets SIRS criteria, sepsis order set utilized code sepsis called.  30 cc/kg fluid bolus ordered.  Chart review shows patient recently on Zosyn  but given copious amounts of diarrhea concern for  possible C. difficile.  Discussed case with Dr. Kathrynn Humble, will start patient on cefepime/Flagyl.  EKG: Sinus tachycardia Nonspecific intraventricular conduction delay Low voltage with right axis deviation Borderline ST depression, diffuse leads Since last tracing rate faster Otherwise no significant change Confirmed by Daleen Bo [phone]) on 02/21/2020 3:33:01 PM - I have reviewed and interpreted the following labs:  CBC shows leukopenia of 2.4, no anemia. Ethanol negative, patient does not appear to be in acute withdrawal at this time. CMP shows mild hypokalemia, no emergent electrolyte derangement, no LFT elevations or gap.  Decreased bicarb 15 possibly secondary to dehydration/vomiting. High-sensitivity troponin within normal limits. Magnesium low 1.1, will replete. Lactic acid elevated 4.1. C. difficile negative Covid/influenza panel negative Urinalysis shows no evidence of infection, no ketones present.  DG Right Hand: IMPRESSION:  Amputation of the fifth finger. No residual or recurrent  osteomyelitis.   CXR:   IMPRESSION:  1. Stable enlarged cardiac silhouette.  2. No acute airspace disease.   CT Head:  IMPRESSION:  1. No acute intracranial abnormality.  - Patient seen and evaluated by Dr. Kathrynn Humble, case were discussed.  Other differentials of chest pain tachycardia altered mental status considered.  Will CT angio chest to evaluate for PE or other intrathoracic pathology.  CTA Chest:  IMPRESSION:  1. No pulmonary embolus.  2. Hypoventilatory change in the dependent lungs without focal  airspace disease.  3. Patulous esophagus with occasional areas of wall thickening, can  be seen with reflux or esophagitis.  4. Equivocal wall thickening and fat stranding about the gallbladder  in the upper abdomen versus motion artifact. Recommend correlation  with clinical symptoms.  5. Coronary artery calcifications.    Aortic Atherosclerosis (ICD10-I70.0).   Patient was  initially given p.o. Tylenol shortly after he vomited the pill back up.  Temperature increased.  Given rectal Tylenol with improvement of fever. Patient reevaluated he is resting comfortably in bed vital signs improving following fluid bolus and antibiotics.  He reports that he is feeling well, he gives a thumbs up.  He has no abdominal pain on examination specifically no right upper quadrant tenderness.  Will obtain screening ultrasound.  Consult placed to hospitalist for admission.  Patient has received Protonix for possible esophagitis. ----- Care handoff given to Dr. Kathrynn Humble at shift change.  Awaiting hospitalist callback.  Plan of care is admission for sepsis unclear source at this time possible bacteremia given recent osteomyelitis.  Ryan Mcbride was evaluated in Emergency Department on 02/21/2020 for the symptoms described in the history of present illness. He was evaluated in the context of the global COVID-19 pandemic, which necessitated consideration that the patient might be at risk for infection with the SARS-CoV-2 virus that causes COVID-19. Institutional protocols and algorithms that pertain to the evaluation of patients at risk for COVID-19 are in a state of rapid change based on information released by regulatory bodies including the CDC and federal and state organizations. These policies and algorithms were followed during the patient's care in the ED.   Note: Portions of this report may have been transcribed using voice recognition software. Every effort was made to ensure accuracy; however, inadvertent computerized transcription errors may still be present. Final Clinical Impression(s) / ED Diagnoses Final diagnoses:  Abnormal CT scan, gallbladder  Sepsis without acute organ dysfunction, due to unspecified organism [facility])    Rx / DC Orders ED Discharge Orders    None       Athena Masse,  Camillia Herter, PA-C 02/21/20 Gladis Riffle, MD 02/22/20 1544

## 2020-02-21 NOTE — ED Notes (Signed)
Date and time results received: 02/21/20 1637 (use smartphrase ".now" to insert current time)  Test: Lactic Acid Critical Value: 4.1  Name of Provider Notified: Rhunette Croft, MD  Orders Received? Or Actions Taken?:

## 2020-02-22 ENCOUNTER — Inpatient Hospital Stay (HOSPITAL_COMMUNITY): Payer: Self-pay

## 2020-02-22 DIAGNOSIS — A419 Sepsis, unspecified organism: Secondary | ICD-10-CM

## 2020-02-22 DIAGNOSIS — R6520 Severe sepsis without septic shock: Secondary | ICD-10-CM

## 2020-02-22 LAB — CBC
HCT: 35.5 % — ABNORMAL LOW (ref 39.0–52.0)
Hemoglobin: 11.9 g/dL — ABNORMAL LOW (ref 13.0–17.0)
MCH: 29.5 pg (ref 26.0–34.0)
MCHC: 33.5 g/dL (ref 30.0–36.0)
MCV: 87.9 fL (ref 80.0–100.0)
Platelets: 142 10*3/uL — ABNORMAL LOW (ref 150–400)
RBC: 4.04 MIL/uL — ABNORMAL LOW (ref 4.22–5.81)
RDW: 12 % (ref 11.5–15.5)
WBC: 12.1 10*3/uL — ABNORMAL HIGH (ref 4.0–10.5)
nRBC: 0 % (ref 0.0–0.2)

## 2020-02-22 LAB — COMPREHENSIVE METABOLIC PANEL
ALT: 21 U/L (ref 0–44)
AST: 25 U/L (ref 15–41)
Albumin: 2.9 g/dL — ABNORMAL LOW (ref 3.5–5.0)
Alkaline Phosphatase: 63 U/L (ref 38–126)
Anion gap: 8 (ref 5–15)
BUN: 10 mg/dL (ref 8–23)
CO2: 24 mmol/L (ref 22–32)
Calcium: 8 mg/dL — ABNORMAL LOW (ref 8.9–10.3)
Chloride: 103 mmol/L (ref 98–111)
Creatinine, Ser: 0.98 mg/dL (ref 0.61–1.24)
GFR, Estimated: >60 mL/min (ref >60)
Glucose, Bld: 250 mg/dL — ABNORMAL HIGH (ref 70–99)
Potassium: 3.3 mmol/L — ABNORMAL LOW (ref 3.5–5.1)
Sodium: 135 mmol/L (ref 135–145)
Total Bilirubin: 0.7 mg/dL (ref 0.3–1.2)
Total Protein: 5.8 g/dL — ABNORMAL LOW (ref 6.5–8.1)

## 2020-02-22 LAB — HEMOGLOBIN A1C
Hgb A1c MFr Bld: 9.5 % — ABNORMAL HIGH (ref 4.8–5.6)
Mean Plasma Glucose: 225.95 mg/dL

## 2020-02-22 LAB — GASTROINTESTINAL PANEL BY PCR, STOOL (REPLACES STOOL CULTURE)

## 2020-02-22 LAB — GLUCOSE, CAPILLARY
Glucose-Capillary: 276 mg/dL — ABNORMAL HIGH (ref 70–99)
Glucose-Capillary: 189 mg/dL — ABNORMAL HIGH (ref 70–99)
Glucose-Capillary: 171 mg/dL — ABNORMAL HIGH (ref 70–99)

## 2020-02-22 LAB — MRSA PCR SCREENING: MRSA by PCR: NEGATIVE

## 2020-02-22 LAB — MAGNESIUM: Magnesium: 1.4 mg/dL — ABNORMAL LOW (ref 1.7–2.4)

## 2020-02-22 LAB — PROTIME-INR
INR: 1.4 — ABNORMAL HIGH (ref 0.8–1.2)
Prothrombin Time: 16.5 seconds — ABNORMAL HIGH (ref 11.4–15.2)

## 2020-02-22 LAB — PHOSPHORUS: Phosphorus: 3.4 mg/dL (ref 2.5–4.6)

## 2020-02-22 LAB — APTT: aPTT: 40 seconds — ABNORMAL HIGH (ref 24–36)

## 2020-02-22 MED ORDER — POTASSIUM CHLORIDE CRYS ER 20 MEQ PO TBCR
40.0000 meq | EXTENDED_RELEASE_TABLET | Freq: Once | ORAL | Status: AC
  Administered 2020-02-22: 40 meq via ORAL
  Filled 2020-02-22: qty 2

## 2020-02-22 MED ORDER — METRONIDAZOLE IN NACL 5-0.79 MG/ML-% IV SOLN
500.0000 mg | Freq: Three times a day (TID) | INTRAVENOUS | Status: DC
  Administered 2020-02-22 (×2): 500 mg via INTRAVENOUS
  Filled 2020-02-22 (×2): qty 100

## 2020-02-22 MED ORDER — VANCOMYCIN HCL IN DEXTROSE 1-5 GM/200ML-% IV SOLN: 1000.0000 mg | Freq: Two times a day (BID) | INTRAVENOUS | Status: DC

## 2020-02-22 MED ORDER — INSULIN ASPART 100 UNIT/ML ~~LOC~~ SOLN
0.0000 [IU] | Freq: Three times a day (TID) | SUBCUTANEOUS | Status: DC
  Administered 2020-02-22: 3 [IU] via SUBCUTANEOUS
  Administered 2020-02-22: 8 [IU] via SUBCUTANEOUS
  Administered 2020-02-23: 5 [IU] via SUBCUTANEOUS
  Administered 2020-02-23: 2 [IU] via SUBCUTANEOUS

## 2020-02-22 MED ORDER — INSULIN ASPART 100 UNIT/ML ~~LOC~~ SOLN: 0.0000 [IU] | Freq: Every day | SUBCUTANEOUS | Status: DC

## 2020-02-22 MED ORDER — VANCOMYCIN HCL 1750 MG/350ML IV SOLN
1750.0000 mg | Freq: Once | INTRAVENOUS | Status: AC
  Administered 2020-02-22: 1750 mg via INTRAVENOUS
  Filled 2020-02-22 (×2): qty 350

## 2020-02-22 MED ORDER — SODIUM CHLORIDE 0.9 % IV SOLN
2.0000 g | Freq: Three times a day (TID) | INTRAVENOUS | Status: DC
  Administered 2020-02-22: 2 g via INTRAVENOUS
  Filled 2020-02-22: qty 2

## 2020-02-22 MED ORDER — INSULIN GLARGINE 100 UNIT/ML ~~LOC~~ SOLN
15.0000 [IU] | Freq: Every day | SUBCUTANEOUS | Status: DC
  Administered 2020-02-22 – 2020-02-23 (×2): 15 [IU] via SUBCUTANEOUS
  Filled 2020-02-22 (×5): qty 0.15

## 2020-02-22 MED ORDER — CHLORHEXIDINE GLUCONATE CLOTH 2 % EX PADS
6.0000 | MEDICATED_PAD | Freq: Every day | CUTANEOUS | Status: DC
  Administered 2020-02-22: 6 via TOPICAL

## 2020-02-22 MED ORDER — MAGNESIUM SULFATE 2 GM/50ML IV SOLN
2.0000 g | Freq: Once | INTRAVENOUS | Status: AC
  Administered 2020-02-22: 2 g via INTRAVENOUS
  Filled 2020-02-22: qty 50

## 2020-02-22 NOTE — Progress Notes (Signed)
[facility] PROGRESS NOTE    Coty Larsh  HKV:[id number] DOB: Jun 24, 1955 DOA: 02/21/2020 PCP: Patient, [name]      Brief Narrative:  Mr. Alvira Monday is a 64 y.o. M with alcohol use, DM, Hx BKA, hx recent finger amputation with osteomyelitis on Zosyn through PICC line, who presented with confusion, fever and chills.  Had an intiial hand lac 2 months ago, debrided and treated with AUgmentin, returned within 2 weeks with purulent drainage, underwent second debridement, this time growing Klebsiella, enterococcus and E coli and so was started on IV Zosyn for 4 weeks.    In the last few days, family is in the process of moving from one apartment to the other and so he has not gotten Zosyn for 2 days.  After about 2 days, daughter noticed he started to feel ill, and then he had rigors and shakes and so she brought him to the ER.  In the ER, temp 1.41F, lactic acid 4.1.  Started on empiric cefepime and Flagyl.       Assessment & Plan:  Severe sepsis from hand due to lapsed IV antibiotics Osteomyelitis of the hand Patient prsented with confusion, lactic acid >4, fever and leukopenia.  CXR clear.  COvid and flu negative.  Cdiff negative. Urine clear.   Daughter reports that he recently lapsed his Zosyn due to family circumstances -Resume Zosyn -FOllow blood cultures, if negative at 48 hours and no further fever, back home with [facility] antibiotics   Acute metabolic encephalopathy Patient is normally oriented to person, place, and situation.  He is currently somewhat confused to situation.,  Likely due to sepsis, which is resolving.  HTN -Continue lisinopril  Diabetes -Start insulin corrections -Start Lantus  Hypokalemia -Supplement K, mag  Alcohol use No signs of withdrawal at this time.       Disposition: Status is: Inpatient  Remains inpatient appropriate because:IV treatments appropriate due to intensity of illness or inability to take PO   Dispo: The  patient is from: Home              Anticipated d/c is to: Home              Anticipated d/c date is: 1 day              Patient currently is not medically stable to d/c.              MDM: The below labs and imaging reports were reviewed and summarized above.  Medication management as above.    DVT prophylaxis: enoxaparin (LOVENOX) injection 40 mg Start: 02/21/20 2030 SCDs Start: 02/21/20 1947  Code Status: FULL Family Communication: daughter by phone               Subjective: Patient is feeling better.  He has no focal cellulitis or increasing pain or drainage from his hand.  He had no cough, sputum.  He has no abdominal pain.  No dysuria.  Objective: Vitals:   02/22/20 0000 02/22/20 0100 02/22/20 0200 02/22/20 0300  BP: 111/60 100/61 (!) 144/81 (!) 164/101  Pulse: 85 80 81 86  Resp: (!) 22 16 (!) 27 (!) 25  Temp:      TempSrc:      SpO2: 96% 96% 98% 99%  Weight:      Height:        Intake/Output Summary (Last 24 hours) at 02/22/2020 0746 Last data filed at 02/22/2020 0322 Gross per 24 hour  Intake 2562.56  ml  Output 300 ml  Net 2262.56 ml   Filed Weights   02/21/20 1423  Weight: 72.6 kg    Examination: General appearance:  adult male, alert and in no obvious distress.  Lying in bed.  All history collected through telephonic interpreter. HEENT: Anicteric, conjunctiva pink, lids and lashes normal. No nasal deformity, discharge, epistaxis.  Lips moist, partially edentulous, oropharynx moist, no oral lesions.   Skin: Warm and dry.  No jaundice.  No suspicious rashes or lesions.  No tenderness around his right hand amputation wound which is a little bit red but, but no edema or induration and seems to be healing well. Cardiac: RRR, nl S1-S2, no murmurs appreciated.  Capillary refill is brisk.  JVP normal.  No LE edema.  Radial  pulses 2+ and symmetric. Respiratory: Normal respiratory rate and rhythm.  CTAB without rales or wheezes. Abdomen:  Abdomen soft.  No TTP or guarding. No ascites, distension, hepatosplenomegaly.   Neuro: Awake and alert.  EOMI, moves all extremities. Speech fluent.    Psych: Sensorium intact and responding to questions, attention normal. Affect normal.  Judgment and insight appear moderately impaired.    Data Reviewed: I have personally reviewed following labs and imaging studies:  CBC: Recent Labs  Lab 02/21/20 1535 02/22/20 0630  WBC 2.4* 12.1*  NEUTROABS 1.9  --   HGB 14.5 11.9*  HCT 43.4 35.5*  MCV 88.8 87.9  PLT 159 142*   Basic Metabolic Panel: Recent Labs  Lab 02/21/20 1535 02/21/20 1537 02/22/20 0630  NA 136  --  135  K 3.2*  --  3.3*  CL 107  --  103  CO2 15*  --  24  GLUCOSE 262*  --  250*  BUN 13  --  10  CREATININE 1.09  --  0.98  CALCIUM 8.4*  --  8.0*  MG  --  1.1* 1.4*  PHOS  --   --  3.4   GFR: Estimated Creatinine Clearance: 66.2 mL/min (by C-G formula based on SCr of 0.98 mg/dL). Liver Function Tests: Recent Labs  Lab 02/21/20 1535 02/22/20 0630  AST 23 25  ALT 15 21  ALKPHOS 94 63  BILITOT 1.2 0.7  PROT 6.9 5.8*  ALBUMIN 3.6 2.9*   No results for input(s): LIPASE, AMYLASE in the last 168 hours. No results for input(s): AMMONIA in the last 168 hours. Coagulation Profile: Recent Labs  Lab 02/21/20 1742 02/22/20 0630  INR 1.3* 1.4*   Cardiac Enzymes: No results for input(s): CKTOTAL, CKMB, CKMBINDEX, TROPONINI in the last 168 hours. BNP (last 3 results) No results for input(s): PROBNP in the last 8760 hours. HbA1C: No results for input(s): HGBA1C in the last 72 hours. CBG: Recent Labs  Lab 02/21/20 1423  GLUCAP 217*   Lipid Profile: No results for input(s): CHOL, HDL, LDLCALC, TRIG, CHOLHDL, LDLDIRECT in the last 72 hours. Thyroid Function Tests: No results for input(s): TSH, T4TOTAL, FREET4, T3FREE, THYROIDAB in the last 72 hours. Anemia Panel: No results for input(s): VITAMINB12, FOLATE, FERRITIN, TIBC, IRON, RETICCTPCT in the last 72  hours. Urine analysis:    Component Value Date/Time   COLORURINE YELLOW 02/21/2020 1426   APPEARANCEUR CLEAR 02/21/2020 1426   LABSPEC 1.006 02/21/2020 1426   PHURINE 5.0 02/21/2020 1426   GLUCOSEU >=500 (A) 02/21/2020 1426   HGBUR SMALL (A) 02/21/2020 1426   BILIRUBINUR NEGATIVE 02/21/2020 1426   KETONESUR NEGATIVE 02/21/2020 1426   PROTEINUR 30 (A) 02/21/2020 1426   NITRITE NEGATIVE 02/21/2020 1426  LEUKOCYTESUR NEGATIVE 02/21/2020 1426   Sepsis Labs: @LABRCNTIP (procalcitonin:4,lacticacidven:4)  ) Recent Results (from the past 240 hour(s))  Resp Panel by RT-PCR (Flu A&B, Covid) Nasopharyngeal Swab     Status: None   Collection Time: 02/21/20  2:37 PM   Specimen: Nasopharyngeal Swab; Nasopharyngeal(NP) swabs in vial transport medium  Result Value Ref Range Status   SARS Coronavirus 2 by RT PCR NEGATIVE NEGATIVE Final    Comment: (NOTE) SARS-CoV-2 target nucleic acids are NOT DETECTED.  The SARS-CoV-2 RNA is generally detectable in upper respiratory specimens during the acute phase of infection. The lowest concentration of SARS-CoV-2 viral copies this assay can detect is 138 copies/mL. A negative result does not preclude SARS-Cov-2 infection and should not be used as the sole basis for treatment or other patient management decisions. A negative result may occur with  improper specimen collection/handling, submission of specimen other than nasopharyngeal swab, presence of viral mutation(s) within the areas targeted by this assay, and inadequate number of viral copies(<138 copies/mL). A negative result must be combined with clinical observations, patient history, and epidemiological information. The expected result is Negative.  Fact Sheet for Patients:  02/23/20  Fact Sheet for Healthcare Providers:  BloggerCourse.com  This test is no t yet approved or cleared by the [employer]ious[employer].[place] FDA and  has been authorized  for detection and/or diagnosis of SARS-CoV-2 by FDA under an Emergency Use Authorization (EUA). This EUA will remain  in effect (meaning this test can be used) for the duration of the COVID-19 declaration under Section 564(b)(1) of the Act, 21 U.S.C.section 360bbb-3(b)(1), unless the authorization is terminated  or revoked sooner.       Influenza A by PCR NEGATIVE NEGATIVE Final   Influenza B by PCR NEGATIVE NEGATIVE Final    Comment: (NOTE) The Xpert Xpress SARS-CoV-2/FLU/RSV plus assay is intended as an aid in the diagnosis of influenza from Nasopharyngeal swab specimens and should not be used as a sole basis for treatment. Nasal washings and aspirates are unacceptable for Xpert Xpress SARS-CoV-2/FLU/RSV testing.  Fact Sheet for Patients: [place]  Fact Sheet for Healthcare Providers: BloggerCourse.com  This test is not yet approved or cleared by the [employer]ious[employer]ker.it FDA and has been authorized for detection and/or diagnosis of SARS-CoV-2 by FDA under an Emergency Use Authorization (EUA). This EUA will remain in effect (meaning this test can be used) for the duration of the COVID-19 declaration under Section 564(b)(1) of the Act, 21 U.S.C. section 360bbb-3(b)(1), unless the authorization is terminated or revoked.  Performed at [facility], [address] [place], [place]   Blood culture (routine x 2)     Status: None (Preliminary result)   Collection Time: 02/21/20  3:22 PM   Specimen: BLOOD RIGHT HAND  Result Value Ref Range Status   Specimen Description BLOOD RIGHT HAND  Final   Special Requests   Final    BOTTLES DRAWN AEROBIC ONLY Blood Culture results may not be optimal due to an inadequate volume of blood received in culture bottles   Culture   Final    NO GROWTH < 24 HOURS Performed at [facility], [address], [place], [place]    Report Status PENDING  Incomplete  C Difficile Quick  Screen w PCR reflex     Status: None   Collection Time: 02/21/20  3:25 PM  Result Value Ref Range Status   C Diff antigen NEGATIVE NEGATIVE Final   C Diff toxin NEGATIVE NEGATIVE Final   C Diff interpretation  No C. difficile detected.  Final    Comment: Performed at [facility], [address], [place], [place] [address]  Blood culture (routine x 2)     Status: None (Preliminary result)   Collection Time: 02/21/20  3:36 PM   Specimen: BLOOD  Result Value Ref Range Status   Specimen Description BLOOD  Final   Special Requests NONE  Final   Culture   Final    NO GROWTH < 24 HOURS Performed at [facility], [address] [place], [place] [address]    Report Status PENDING  Incomplete  MRSA PCR Screening     Status: None   Collection Time: 02/21/20  9:50 PM   Specimen: Nasal Mucosa; Nasopharyngeal  Result Value Ref Range Status   MRSA by PCR NEGATIVE NEGATIVE Final    Comment:        The GeneXpert MRSA Assay (FDA approved for NASAL specimens only), is one component of a comprehensive MRSA colonization surveillance program. It is not intended to diagnose MRSA infection nor to guide or monitor treatment for MRSA infections. Performed at [facility], [address], [place], [place] [address]          Radiology Studies: DG Chest 2 View  Result Date: 02/21/2020 CLINICAL DATA:  Fever, history of hypertension EXAM: CHEST - 2 VIEW COMPARISON:  06/20/2017 FINDINGS: Frontal and lateral views of the chest demonstrates stable enlargement of the cardiac silhouette. No airspace disease, effusion, or pneumothorax. No acute bony abnormalities. IMPRESSION: 1. Stable enlarged cardiac silhouette. 2. No acute airspace disease. Electronically Signed   By: Sharlet Salina M.D.   On: 02/21/2020 17:00   CT Head Wo Contrast  Result Date: 02/21/2020 CLINICAL DATA:  Altered mental status. EXAM: CT HEAD WITHOUT CONTRAST TECHNIQUE: Contiguous axial images were obtained from the base of the skull  through the vertex without intravenous contrast. COMPARISON:  CT head dated January 18, 2020. FINDINGS: Brain: No evidence of acute infarction, hemorrhage, hydrocephalus, extra-axial collection or mass lesion/mass effect. Stable mild atrophy. 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 Vascular: Atherosclerotic vascular calcification of the carotid siphons. No hyperdense vessel. Skull: Normal. Negative for fracture or focal lesion. Sinuses/Orbits: No acute finding. Other: None. IMPRESSION: 1. No acute intracranial abnormality. Electronically Signed   By: Obie Dredge M.D.   On: 02/21/2020 17:03   CT Angio Chest PE W and/or Wo Contrast  Result Date: 02/21/2020 CLINICAL DATA:  PE suspected, high prob Fever. EXAM: CT ANGIOGRAPHY CHEST WITH CONTRAST TECHNIQUE: Multidetector CT imaging of the chest was performed using the standard protocol during bolus administration of intravenous contrast. Multiplanar CT image reconstructions and MIPs were obtained to evaluate the vascular anatomy. CONTRAST:  75mL OMNIPAQUE IOHEXOL 350 MG/ML SOLN COMPARISON:  Radiograph earlier today. FINDINGS: Cardiovascular: There are no filling defects within the pulmonary arteries to suggest pulmonary embolus. Thoracic aorta is normal in caliber. No dissection or acute aortic findings. Upper normal heart size. There are coronary artery calcifications. No pericardial effusion. There is a left upper extremity PICC with tip obscured by dense IV contrast in the IVC. Mediastinum/Nodes: Small mediastinal and hilar lymph nodes are not enlarged by size criteria. Patulous esophagus with occasional areas of wall thickening. No visualized thyroid nodule. Lungs/Pleura: Hypoventilatory changes in the dependent lungs. No confluent consolidation or focal airspace disease. No findings of pulmonary edema. Trachea and central bronchi are patent. No pleural fluid. No pulmonary mass. Upper Abdomen: Motion artifact limits assessment. Equivocal wall  thickening and fat stranding about the gallbladder  versus motion. Fluid-filled stomach. Musculoskeletal: There are no acute or suspicious osseous abnormalities. Mild thoracic spondylosis. Review of the MIP images confirms the above findings. IMPRESSION: 1. No pulmonary embolus. 2. Hypoventilatory change in the dependent lungs without focal airspace disease. 3. Patulous esophagus with occasional areas of wall thickening, can be seen with reflux or esophagitis. 4. Equivocal wall thickening and fat stranding about the gallbladder in the upper abdomen versus motion artifact. Recommend correlation with clinical symptoms. 5. Coronary artery calcifications. Aortic Atherosclerosis (ICD10-I70.0). Electronically Signed   By: Narda RutherfordMelanie  Sanford M.D.   On: 02/21/2020 18:33   DG Hand Complete Right  Result Date: 02/21/2020 CLINICAL DATA:  History of osteomyelitis EXAM: RIGHT HAND - COMPLETE 3+ VIEW COMPARISON:  01/30/2020 FINDINGS: Interval amputation of the fifth finger at the MCP level. No evidence of residual osteomyelitis. There is deformity of the proximal fifth metacarpal which is unchanged and appears to be due to healed fracture. Mild degenerative change in the first MCP. Chronic irregularity of the ulnar styloid may be posttraumatic. No fracture or osteomyelitis. Extensive arterial calcification. IMPRESSION: Amputation of the fifth finger. No residual or recurrent osteomyelitis. Electronically Signed   By: Marlan Palauharles  Clark M.D.   On: 02/21/2020 16:59        Scheduled Meds: . enoxaparin (LOVENOX) injection  40 mg Subcutaneous Q24H  . lisinopril  20 mg Oral Daily   Continuous Infusions: . lactated ringers Stopped (02/22/20 0240)  . piperacillin-tazobactam (ZOSYN)  IV 3.375 g (02/22/20 0525)     LOS: 1 day    Time spent: 35 miunutes    Alberteen Samhristopher P Nurah Petrides, MD [facility] 02/22/2020, 7:46 AM     Please page though AMION or Epic secure chat:  For [employer] Holdings Corporation[employer]ion password, Higher education careers advisercontact charge nurse

## 2020-02-22 NOTE — Progress Notes (Signed)
PICC Line dressing removed (dirty and not adhered on arrival to ICU) area cleansed with CHG scrubber and allowed to dry for the indicated time. A new biopatch was applied and new occlusive dressing was applied using all kit pieces. Line , lock, and valve were all cleaned with CHG scrubber and allowed to dry for the indicated time.  Blood return is noted and line was flushed with normal Saline flush and is currently in use.

## 2020-02-22 NOTE — Progress Notes (Addendum)
Pharmacy Antibiotic Note  Ryan Mcbride is a 64 y.o. male admitted on 02/21/2020 with sepsis.  Pharmacy was  consulted for vancomycin and cefepime  dosing, but these meds have been discontinued so that Zosyn therapy can be started.   Plan: Start Zosyn 3.375g IV q8h (4-hr infusion) Pharmacy will continue to monitor renal function, cultures and patient progress.    Height: 5\' 5"  (165.1 cm) Weight: 72.6 kg (160 lb) IBW/kg (Calculated) : 61.5  Temp (24hrs), Avg:101.3 F (38.5 C), Min:97.4 F (36.3 C), Max:103.8 F (39.9 C)  Recent Labs  Lab 02/21/20 1522 02/21/20 1535 02/21/20 1742 02/21/20 2041 02/21/20 2243 02/22/20 0630  WBC  --  2.4*  --   --   --  12.1*  CREATININE  --  1.09  --   --   --  0.98  LATICACIDVEN 4.1*  --  5.6* 2.5* 2.2*  --     Estimated Creatinine Clearance: 66.2 mL/min (by C-G formula based on SCr of 0.98 mg/dL).    No Known Allergies  Antimicrobials this admission: metronidazole 12/30 >> 12/31 cefepime 12/30> 12/31 Zosyn 12/30>>re-started 12/31>> vancomycin 12/31>>12/31   Microbiology results: 12/30 BC x2: NG <24h 12/30  UCx:   12/30 Resp PCR: SARS CoV-2 negative; Flu A/B negative 12/31 MRSA PCR: negative 12/30 GI PCR: neg  Thank you for allowing pharmacy to be a part of this patient's care.  1/31 02/22/2020 9:20 AM

## 2020-02-23 LAB — GLUCOSE, CAPILLARY
Glucose-Capillary: 139 mg/dL — ABNORMAL HIGH (ref 70–99)
Glucose-Capillary: 236 mg/dL — ABNORMAL HIGH (ref 70–99)

## 2020-02-23 LAB — URINE CULTURE

## 2020-02-23 LAB — CULTURE, BLOOD (ROUTINE X 2)

## 2020-02-23 LAB — MAGNESIUM: Magnesium: 1.8 mg/dL (ref 1.7–2.4)

## 2020-02-23 MED ORDER — PIPERACILLIN-TAZOBACTAM 3.375 G IVPB
3.3750 g | Freq: Three times a day (TID) | INTRAVENOUS | Status: DC
  Administered 2020-02-23: 3.375 g via INTRAVENOUS
  Filled 2020-02-23: qty 50

## 2020-02-23 NOTE — TOC Transition Note (Addendum)
Transition of Care [facility]) - CM/SW Discharge Note   Patient Details  Name: Ryan Mcbride MRN: [id number] Date of Birth: January 01, 1956  [facility] CM/SW Contact:  Barry Brunner, LCSW Phone Number: 02/23/2020, 12:16 PM   Clinical Narrative:    CSW spoke with patient's daughter to inquire about ability to assist patient with med management and families agreeableness to the integrated health program. Patient's daughter reported that she was agreeable to referral and also stated, she has been having difficulty taking care of her father due to his declining mental status. CSW notified patient's daughter of needed for signature for referral. Patient's daughter did not sign form upon arriving at hospital. Nurse contacted patient's daughter and LVM to notify her of needed signature. Patient requested additional help for her father while she is at school. CSW inquired if patient had been referred to [facility]. Patient's daughter reported that Frances Furbish was suppose to refer her father, but she never heard back from DSS or [facility] for referral. CSW inquired about agreeableness to refer her to the financial counselor for medicaid and CAP aid. Patient's daughter agreeable. CSW referred patient for medicaid and CAP aid. TOC signing off.    Final next level of care: Home/Self Care Barriers to Discharge: Barriers Resolved   Patient Goals and CMS Choice Patient states their goals for this hospitalization and ongoing recovery are:: Return home with integreated health CMS Medicare.gov Compare Post Acute Care list provided to:: Patient Choice offered to / list presented to : Patient  Discharge Placement                    Patient and family notified of of transfer: 02/23/20  Discharge Plan and Services                DME Arranged: N/A DME Agency: NA       HH Arranged: NA HH Agency: NA        Social Determinants of Health (SDOH) Interventions     Readmission Risk  Interventions Readmission Risk Prevention Plan 01/22/2020  Post Dischage Appt Complete  Medication Screening Complete  Transportation Screening Complete  Some recent data might be hidden

## 2020-02-23 NOTE — Progress Notes (Signed)
Being discharged to home. PICC left in for further IV antibotics. Other IV access removed. Patients cloths were soiled so paper pants given to travel home. Rolled to car and was driven home by daughter. All paperwork given to patient was in Spanish due to native language.

## 2020-02-23 NOTE — Discharge Summary (Signed)
Physician Discharge Summary  Dayle Mcnerney SWF:[id number] DOB: 1955/06/28 DOA: 02/21/2020  PCP: Patient, [name]  Admit date: 02/21/2020 Discharge date: 02/23/2020  Admitted From: Home with [place] Disposition:  Home with [facility]   Recommendations for Outpatient Follow-up:  1. Follow up with Dr. Oletta Lamas on Jan. 4  2. Follow up with ID Dr. [name] on Jan. [place]: Infusion therapy    Discharge Condition: Fair  CODE STATUS: FULL Diet recommendation: Diabetic  Brief/Interim Summary: Mr. Beatris Ship is a 65 y.o. M with alcohol use, DM, Hx BKA, hx recent finger amputation with osteomyelitis on Zosyn through PICC line, who presented with confusion, fever and chills.  Had an intiial hand lac 2 months ago, debrided and treated with Augmentin, returned within 2 weeks with purulent drainage, underwent second debridement, this time intra-op cultures growing Klebsiella, enterococcus and E coli and so was started on IV Zosyn for 4 weeks.    On day of admission, patient spiked new rigors and malaise, so family brought to ER.  In the ER, temp 102F, lactic acid 4.1.  Started on empiric cefepime and Flagyl.      PRINCIPAL HOSPITAL DIAGNOSIS: Sepsis due osteomyelitis with Klebsiella, Enterococcus and E coli due to lapsed antibiotics     Discharge Diagnoses:   Severe sepsis from hand due to lapsed IV antibiotics Osteomyelitis of the hand After admission, family shared that in the last few days, they were all in the process of moving from one apartment to the other and patient had not been given any Zosyn for at least 48 hours.    In this context, after about 2 days, daughter noticed he appeared ill, slightly confused, and had rigors and shakes and so she brought him to the ER.  CXR clear.  Covid and flu negative.  Cdiff negative. Urinalysis unremarkable.   He was resumed on Zosyn.  His blooed cultures were negative at 48 hours.  He had no focal symptoms of pain in the hand, swelling, new  skin lesions, nor persistent confusion, neck pain or headache, nor cough/sputum/dyspnea, nor urinary irritative symptoms.  Follow up with ID, attention to lapsed antibiotics week of Dec 27 with development of new sepsis.     Acute metabolic encephalopathy Mild.  Resolved with resumption of Zosyn.     HTN Continue lisinopril  Diabetes Continue 70/30  Hypokalemia Resolved  Alcohol use No signs of withdrawal at this time.  Asymptomatic bacteriuria Do not suspect UTI in absence of symptoms.         Discharge Instructions  Discharge Instructions    Diet - low sodium heart healthy   Complete by: As directed    Increase activity slowly   Complete by: As directed    No dressing needed   Complete by: As directed      Allergies as of 02/23/2020   No Known Allergies     Medication List    TAKE these medications   folic acid 1 MG tablet Commonly known as: FOLVITE Take 1 tablet (1 mg total) by mouth daily.   insulin aspart protamine- aspart (70-30) 100 UNIT/ML injection Commonly known as: NOVOLOG MIX 70/30 Inject 0.14 mLs (14 Units total) into the skin 2 (two) times daily with a meal.   Insulin Syringe 27G X 1/2" 1 ML Misc 1 application by Does not apply route 2 (two) times daily.   lisinopril 20 MG tablet Commonly known as: ZESTRIL Take 1 tablet (20 mg total) by mouth daily.  metFORMIN 1000 MG tablet Commonly known as: GLUCOPHAGE Take 1 tablet (1,000 mg total) by mouth 2 (two) times daily with a meal.   multivitamin with minerals Tabs tablet Take 1 tablet by mouth daily.   piperacillin-tazobactam  IVPB Commonly known as: ZOSYN Inject 3.375 g into the vein every 8 (eight) hours for 28 days. As a continuous infusion. Indication:  Osteomyelitis of right hand  First Dose: Yes Last Day of Therapy:  02/29/2020 Labs - Once weekly:  CBC/D and BMP, Labs - Every other week:  ESR and CRP Method of administration: Elastomeric (Continuous infusion) Method  of administration may be changed at the discretion of home infusion pharmacist based upon assessment of the patient and/or caregiver's ability to self-administer the medication ordered.   polyethylene glycol 17 g packet Commonly known as: MIRALAX / GLYCOLAX Take 17 g by mouth daily as needed for mild constipation.   thiamine 100 MG tablet Take 1 tablet (100 mg total) by mouth daily.            Discharge Care Instructions  (From admission, onward)         Start     Ordered   02/23/20 0000  No dressing needed        02/23/20 1004          Follow-up Information    [name], [name], MD. Go to.   Specialty: Infectious Diseases Why: jan 5 at 4pm Contact information: [address] [address] [phone]        Kerin Perna, NP. Go to.   Specialty: Internal Medicine Why: jan 4 at 9:30 am Contact information: [id number] [place] [address] [phone]              No Known Allergies     Procedures/Studies: DG Chest 2 View  Result Date: 02/21/2020 CLINICAL DATA:  Fever, history of hypertension EXAM: CHEST - 2 VIEW COMPARISON:  06/20/2017 FINDINGS: Frontal and lateral views of the chest demonstrates stable enlargement of the cardiac silhouette. No airspace disease, effusion, or pneumothorax. No acute bony abnormalities. IMPRESSION: 1. Stable enlarged cardiac silhouette. 2. No acute airspace disease. Electronically Signed   By: Randa Ngo M.D.   On: 02/21/2020 17:00   CT Head Wo Contrast  Result Date: 02/21/2020 CLINICAL DATA:  Altered mental status. EXAM: CT HEAD WITHOUT CONTRAST TECHNIQUE: Contiguous axial images were obtained from the base of the skull through the vertex without intravenous contrast. COMPARISON:  CT head dated January 18, 2020. FINDINGS: Brain: No evidence of acute infarction, hemorrhage, hydrocephalus, extra-axial collection or mass lesion/mass effect. Stable mild atrophy. 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 Vascular: Atherosclerotic vascular calcification of the carotid siphons. No hyperdense vessel. Skull: Normal. Negative for fracture or focal lesion. Sinuses/Orbits: No acute finding. Other: None. IMPRESSION: 1. No acute intracranial abnormality. Electronically Signed   By: Titus Dubin M.D.   On: 02/21/2020 17:03   CT Angio Chest PE W and/or Wo Contrast  Result Date: 02/21/2020 CLINICAL DATA:  PE suspected, high prob Fever. EXAM: CT ANGIOGRAPHY CHEST WITH CONTRAST TECHNIQUE: Multidetector CT imaging of the chest was performed using the standard protocol during bolus administration of intravenous contrast. Multiplanar CT image reconstructions and MIPs were obtained to evaluate the vascular anatomy. CONTRAST:  59m OMNIPAQUE IOHEXOL 350 MG/ML SOLN COMPARISON:  Radiograph earlier today. FINDINGS: Cardiovascular: There are no filling defects within the pulmonary arteries to suggest pulmonary embolus. Thoracic aorta is normal in caliber. No dissection or  acute aortic findings. Upper normal heart size. There are coronary artery calcifications. No pericardial effusion. There is a left upper extremity PICC with tip obscured by dense IV contrast in the IVC. Mediastinum/Nodes: Small mediastinal and hilar lymph nodes are not enlarged by size criteria. Patulous esophagus with occasional areas of wall thickening. No visualized thyroid nodule. Lungs/Pleura: Hypoventilatory changes in the dependent lungs. No confluent consolidation or focal airspace disease. No findings of pulmonary edema. Trachea and central bronchi are patent. No pleural fluid. No pulmonary mass. Upper Abdomen: Motion artifact limits assessment. Equivocal wall thickening and fat stranding about the gallbladder versus motion. Fluid-filled stomach. Musculoskeletal: There are no acute or suspicious osseous abnormalities. Mild thoracic spondylosis. Review of the MIP images confirms the above findings. IMPRESSION: 1. No pulmonary  embolus. 2. Hypoventilatory change in the dependent lungs without focal airspace disease. 3. Patulous esophagus with occasional areas of wall thickening, can be seen with reflux or esophagitis. 4. Equivocal wall thickening and fat stranding about the gallbladder in the upper abdomen versus motion artifact. Recommend correlation with clinical symptoms. 5. Coronary artery calcifications. Aortic Atherosclerosis (ICD10-I70.0). Electronically Signed   By: Keith Rake M.D.   On: 02/21/2020 18:33   DG Hand Complete Right  Result Date: 02/21/2020 CLINICAL DATA:  History of osteomyelitis EXAM: RIGHT HAND - COMPLETE 3+ VIEW COMPARISON:  01/30/2020 FINDINGS: Interval amputation of the fifth finger at the MCP level. No evidence of residual osteomyelitis. There is deformity of the proximal fifth metacarpal which is unchanged and appears to be due to healed fracture. Mild degenerative change in the first MCP. Chronic irregularity of the ulnar styloid may be posttraumatic. No fracture or osteomyelitis. Extensive arterial calcification. IMPRESSION: Amputation of the fifth finger. No residual or recurrent osteomyelitis. Electronically Signed   By: Franchot Gallo M.D.   On: 02/21/2020 16:59   DG Hand Complete Right  Result Date: 01/30/2020 CLINICAL DATA:  Wound infection. EXAM: RIGHT HAND - COMPLETE 3+ VIEW COMPARISON:  01/18/2020 FINDINGS: The fifth digit PIP joint remains located. No acute fracture. Remote distal radius fracture with settling and ulnar positive variance. Remote boxer's fracture with healed angulation. Interphalangeal and MCP osteoarthritis. Extensive arterial calcification. No evidence of osteomyelitis. IMPRESSION: 1. No acute finding. 2. Chronic findings are described above. Electronically Signed   By: Monte Fantasia M.D.   On: 01/30/2020 06:35   [place] EKG SITE RITE  Result Date: 01/31/2020 If Site Rite image not attached, placement could not be confirmed due to current cardiac rhythm.  US  Abdomen Limited RUQ (LIVER/GB)  Result Date: 02/22/2020 CLINICAL DATA:  Abdominal pain. History of alcohol abuse. Follow-up to CT scan dated 02/21/2020 EXAM: ULTRASOUND ABDOMEN LIMITED RIGHT UPPER QUADRANT COMPARISON:  CT, 02/21/2020. FINDINGS: Gallbladder: Gallbladder mild-to-moderately distended. Wall is thickened with a trace amount of pericholecystic fluid. Wall measures 5 mm. No gallstones. No sonographic Murphy's sign. Common bile duct: Diameter: 3 mm Liver: Borderline mild increase in parenchymal echogenicity. No liver mass or focal lesion. Portal vein is patent on color Doppler imaging with normal direction of blood flow towards the liver. Other: None. IMPRESSION: 1. Gallbladder wall is thickened to 5 mm with a trace amount of pericholecystic fluid, but no visualized gallstone or sonographic Murphy's sign. Findings are equivocal may be reactive due to adjacent liver pathology. 2. Borderline increased liver parenchymal echogenicity. Consider hepatic steatosis. Electronically Signed   By: Lajean Manes M.D.   On: 02/22/2020 09:47       Subjective: Feeling well.  No fever, no confusion.  No dysuria, urinary urgency, hematuria.  No cough, sputum, dyspnea.  Discharge Exam: Vitals:   02/23/20 0731 02/23/20 0800  BP:  (!) 182/100  Pulse:    Resp: 20 (!) 23  Temp: 98.4 F (36.9 C)   SpO2:     Vitals:   02/23/20 0200 02/23/20 0507 02/23/20 0731 02/23/20 0800  BP:  (!) 158/88  (!) 182/100  Pulse:      Resp: (!) _0 (!) 23  Temp:  98.8 F (37.1 C) 98.4 F (36.9 C)   TempSrc:  Oral Oral   SpO2:  98%    Weight:      Height:        General: Pt is alert, awake, not in acute distress Cardiovascular: RRR, nl S1-S2, no murmurs appreciated.   No LE edema.   Respiratory: Normal respiratory rate and rhythm.  CTAB without rales or wheezes. Abdominal: Abdomen soft and non-tender.  No distension or HSM.   MSK: BKA noted.  Right hand appears hyperpigmented but not red or swollen or  draining or indurated or painful. Neuro/Psych: Strength symmetric in upper and lower extremities.  Judgment and insight appear normal.   The results of significant diagnostics from this hospitalization (including imaging, microbiology, ancillary and laboratory) are listed below for reference.     Microbiology: Recent Results (from the past 240 hour(s))  Resp Panel by RT-PCR (Flu A&B, Covid) Nasopharyngeal Swab     Status: None   Collection Time: 02/21/20  2:37 PM   Specimen: Nasopharyngeal Swab; Nasopharyngeal(NP) swabs in vial transport medium  Result Value Ref Range Status   SARS Coronavirus 2 by RT PCR NEGATIVE NEGATIVE Final    Comment: (NOTE) SARS-CoV-2 target nucleic acids are NOT DETECTED.  The SARS-CoV-2 RNA is generally detectable in upper respiratory specimens during the acute phase of infection. The lowest concentration of SARS-CoV-2 viral copies this assay can detect is 138 copies/mL. A negative result does not preclude SARS-Cov-2 infection and should not be used as the sole basis for treatment or other patient management decisions. A negative result may occur with  improper specimen collection/handling, submission of specimen other than nasopharyngeal swab, presence of viral mutation(s) within the areas targeted by this assay, and inadequate number of viral copies(<138 copies/mL). A negative result must be combined with clinical observations, patient history, and epidemiological information. The expected result is Negative.  Fact Sheet for Patients:  EntrepreneurPulse.com.au  Fact Sheet for Healthcare Providers:  IncredibleEmployment.be  This test is no t yet approved or cleared by the [place] FDA and  has been authorized for detection and/or diagnosis of SARS-CoV-2 by FDA under an Emergency Use Authorization (EUA). This EUA will remain  in effect (meaning this test can be used) for the duration of the COVID-19 declaration  under Section 564(b)(1) of the Act, 21 U.S.C.section 360bbb-3(b)(1), unless the authorization is terminated  or revoked sooner.       Influenza A by PCR NEGATIVE NEGATIVE Final   Influenza B by PCR NEGATIVE NEGATIVE Final    Comment: (NOTE) The Xpert Xpress SARS-CoV-2/FLU/RSV plus assay is intended as an aid in the diagnosis of influenza from Nasopharyngeal swab specimens and should not be used as a sole basis for treatment. Nasal washings and aspirates are unacceptable for Xpert Xpress SARS-CoV-2/FLU/RSV testing.  Fact Sheet for Patients: EntrepreneurPulse.com.au  Fact Sheet for Healthcare Providers: IncredibleEmployment.be  This test is not yet approved or cleared by the [place] FDA and has been authorized for detection and/or diagnosis of SARS-CoV-2  by FDA under an Emergency Use Authorization (EUA). This EUA will remain in effect (meaning this test can be used) for the duration of the COVID-19 declaration under Section 564(b)(1) of the Act, 21 U.S.C. section 360bbb-3(b)(1), unless the authorization is terminated or revoked.  Performed at [facility], [address] [place], [place] [address]   Gastrointestinal Panel by PCR , Stool     Status: None   Collection Time: 02/21/20  2:51 PM   Specimen: Stool  Result Value Ref Range Status   Campylobacter species NOT DETECTED NOT DETECTED Final   Plesimonas shigelloides NOT DETECTED NOT DETECTED Final   Salmonella species NOT DETECTED NOT DETECTED Final   Yersinia enterocolitica NOT DETECTED NOT DETECTED Final   Vibrio species NOT DETECTED NOT DETECTED Final   Vibrio cholerae NOT DETECTED NOT DETECTED Final   Enteroaggregative E coli (EAEC) NOT DETECTED NOT DETECTED Final   Enteropathogenic E coli (EPEC) NOT DETECTED NOT DETECTED Final   Enterotoxigenic E coli (ETEC) NOT DETECTED NOT DETECTED Final   Shiga like toxin producing E coli (STEC) NOT DETECTED NOT DETECTED Final    Shigella/Enteroinvasive E coli (EIEC) NOT DETECTED NOT DETECTED Final   Cryptosporidium NOT DETECTED NOT DETECTED Final   Cyclospora cayetanensis NOT DETECTED NOT DETECTED Final   Entamoeba histolytica NOT DETECTED NOT DETECTED Final   Giardia lamblia NOT DETECTED NOT DETECTED Final   Adenovirus F40/41 NOT DETECTED NOT DETECTED Final   Astrovirus NOT DETECTED NOT DETECTED Final   Norovirus GI/GII NOT DETECTED NOT DETECTED Final   Rotavirus A NOT DETECTED NOT DETECTED Final   Sapovirus (I, II, IV, and V) NOT DETECTED NOT DETECTED Final    Comment: Performed at [facility], [address], [place], [place] [address]  Urine culture     Status: Abnormal   Collection Time: 02/21/20  3:19 PM   Specimen: In/Out Cath Urine  Result Value Ref Range Status   Specimen Description   Final    IN/OUT CATH URINE Performed at [facility], [address], [place], [place] [address]    Special Requests   Final    NONE Performed at [facility], [address], [place], [place] [address]    Culture MULTIPLE SPECIES PRESENT, SUGGEST RECOLLECTION (A)  Final   Report Status 02/23/2020 FINAL  Final  Blood culture (routine x 2)     Status: None (Preliminary result)   Collection Time: 02/21/20  3:22 PM   Specimen: BLOOD RIGHT HAND  Result Value Ref Range Status   Specimen Description BLOOD RIGHT HAND  Final   Special Requests   Final    BOTTLES DRAWN AEROBIC ONLY Blood Culture results may not be optimal due to an inadequate volume of blood received in culture bottles   Culture   Final    NO GROWTH 2 DAYS Performed at [facility], [address] [place], [place] [address]    Report Status PENDING  Incomplete  C Difficile Quick Screen w PCR reflex     Status: None   Collection Time: 02/21/20  3:25 PM  Result Value Ref Range Status   C Diff antigen NEGATIVE NEGATIVE Final   C Diff toxin NEGATIVE NEGATIVE Final   C Diff interpretation No C. difficile detected.  Final    Comment: Performed at  [facility], [address] [place], [place] [address]  Blood culture (routine x 2)     Status: None (Preliminary result)   Collection Time: 02/21/20  3:36 PM   Specimen: BLOOD  Result Value Ref Range Status   Specimen Description BLOOD  Final   Special Requests NONE  Final   Culture   Final    NO GROWTH 2 DAYS Performed at [facility], [address] [employer], [place] [address]    Report Status PENDING  Incomplete  MRSA PCR Screening     Status: None   Collection Time: 02/21/20  9:50 PM   Specimen: Nasal Mucosa; Nasopharyngeal  Result Value Ref Range Status   MRSA by PCR NEGATIVE NEGATIVE Final    Comment:        The GeneXpert MRSA Assay (FDA approved for NASAL specimens only), is one component of a comprehensive MRSA colonization surveillance program. It is not intended to diagnose MRSA infection nor to guide or monitor treatment for MRSA infections. Performed at [facility], [address], [place], [place] [address]      Labs: BNP (last 3 results) No results for input(s): BNP in the last 8760 hours. Basic Metabolic Panel: Recent Labs  Lab 02/21/20 1535 02/21/20 1537 02/22/20 0630 02/23/20 0347  NA 136  --  135  --   K 3.2*  --  3.3*  --   CL 107  --  103  --   CO2 15*  --  24  --   GLUCOSE 262*  --  250*  --   BUN 13  --  10  --   CREATININE 1.09  --  0.98  --   CALCIUM 8.4*  --  8.0*  --   MG  --  1.1* 1.4* 1.8  PHOS  --   --  3.4  --    Liver Function Tests: Recent Labs  Lab 02/21/20 1535 02/22/20 0630  AST 23 25  ALT 15 21  ALKPHOS 94 63  BILITOT 1.2 0.7  PROT 6.9 5.8*  ALBUMIN 3.6 2.9*   No results for input(s): LIPASE, AMYLASE in the last 168 hours. No results for input(s): AMMONIA in the last 168 hours. CBC: Recent Labs  Lab 02/21/20 1535 02/22/20 0630  WBC 2.4* 12.1*  NEUTROABS 1.9  --   HGB 14.5 11.9*  HCT 43.4 35.5*  MCV 88.8 87.9  PLT 159 142*   Cardiac Enzymes: No results for input(s): CKTOTAL, CKMB, CKMBINDEX, TROPONINI  in the last 168 hours. BNP: Invalid input(s): POCBNP CBG: Recent Labs  Lab 02/21/20 1423 02/22/20 1144 02/22/20 1657 02/22/20 2038 02/23/20 0731  GLUCAP 217* 276* 189* 171* 139*   D-Dimer No results for input(s): DDIMER in the last 72 hours. Hgb A1c Recent Labs    02/22/20 0630  HGBA1C 9.5*   Lipid Profile No results for input(s): CHOL, HDL, LDLCALC, TRIG, CHOLHDL, LDLDIRECT in the last 72 hours. Thyroid function studies No results for input(s): TSH, T4TOTAL, T3FREE, THYROIDAB in the last 72 hours.  Invalid input(s): FREET3 Anemia work up No results for input(s): VITAMINB12, FOLATE, FERRITIN, TIBC, IRON, RETICCTPCT in the last 72 hours. Urinalysis    Component Value Date/Time   COLORURINE YELLOW 02/21/2020 1426   APPEARANCEUR CLEAR 02/21/2020 1426   LABSPEC 1.006 02/21/2020 1426   PHURINE 5.0 02/21/2020 1426   GLUCOSEU >=500 (A) 02/21/2020 1426   HGBUR SMALL (A) 02/21/2020 1426   BILIRUBINUR NEGATIVE 02/21/2020 [place] 02/21/2020 1426   PROTEINUR 30 (A) 02/21/2020 1426   NITRITE NEGATIVE 02/21/2020 1426   LEUKOCYTESUR NEGATIVE 02/21/2020 1426   Sepsis Labs Invalid input(s): PROCALCITONIN,  WBC,  LACTICIDVEN Microbiology Recent Results (from the past 240 hour(s))  Resp Panel by RT-PCR (Flu A&B, Covid) Nasopharyngeal Swab     Status: None   Collection Time: 02/21/20  2:37 PM   Specimen: Nasopharyngeal Swab; Nasopharyngeal(NP) swabs in vial transport medium  Result Value Ref Range Status   SARS Coronavirus 2 by RT PCR NEGATIVE NEGATIVE Final    Comment: (NOTE) SARS-CoV-2 target nucleic acids are NOT DETECTED.  The SARS-CoV-2 RNA is generally detectable in upper respiratory specimens during the acute phase of infection. The lowest concentration of SARS-CoV-2 viral copies this assay can detect is 138 copies/mL. A negative result does not preclude SARS-Cov-2 infection and should not be used as the sole basis for treatment or other patient  management decisions. A negative result may occur with  improper specimen collection/handling, submission of specimen other than nasopharyngeal swab, presence of viral mutation(s) within the areas targeted by this assay, and inadequate number of viral copies(<138 copies/mL). A negative result must be combined with clinical observations, patient history, and epidemiological information. The expected result is Negative.  Fact Sheet for Patients:  EntrepreneurPulse.com.au  Fact Sheet for Healthcare Providers:  IncredibleEmployment.be  This test is no t yet approved or cleared by the [place] FDA and  has been authorized for detection and/or diagnosis of SARS-CoV-2 by FDA under an Emergency Use Authorization (EUA). This EUA will remain  in effect (meaning this test can be used) for the duration of the COVID-19 declaration under Section 564(b)(1) of the Act, 21 U.S.C.section 360bbb-3(b)(1), unless the authorization is terminated  or revoked sooner.       Influenza A by PCR NEGATIVE NEGATIVE Final   Influenza B by PCR NEGATIVE NEGATIVE Final    Comment: (NOTE) The Xpert Xpress SARS-CoV-2/FLU/RSV plus assay is intended as an aid in the diagnosis of influenza from Nasopharyngeal swab specimens and should not be used as a sole basis for treatment. Nasal washings and aspirates are unacceptable for Xpert Xpress SARS-CoV-2/FLU/RSV testing.  Fact Sheet for Patients: EntrepreneurPulse.com.au  Fact Sheet for Healthcare Providers: IncredibleEmployment.be  This test is not yet approved or cleared by the [place] FDA and has been authorized for detection and/or diagnosis of SARS-CoV-2 by FDA under an Emergency Use Authorization (EUA). This EUA will remain in effect (meaning this test can be used) for the duration of the COVID-19 declaration under Section 564(b)(1) of the Act, 21 U.S.C. section 360bbb-3(b)(1),  unless the authorization is terminated or revoked.  Performed at [facility], [address] [place], [place] [address]   Gastrointestinal Panel by PCR , Stool     Status: None   Collection Time: 02/21/20  2:51 PM   Specimen: Stool  Result Value Ref Range Status   Campylobacter species NOT DETECTED NOT DETECTED Final   Plesimonas shigelloides NOT DETECTED NOT DETECTED Final   Salmonella species NOT DETECTED NOT DETECTED Final   Yersinia enterocolitica NOT DETECTED NOT DETECTED Final   Vibrio species NOT DETECTED NOT DETECTED Final   Vibrio cholerae NOT DETECTED NOT DETECTED Final   Enteroaggregative E coli (EAEC) NOT DETECTED NOT DETECTED Final   Enteropathogenic E coli (EPEC) NOT DETECTED NOT DETECTED Final   Enterotoxigenic E coli (ETEC) NOT DETECTED NOT DETECTED Final   Shiga like toxin producing E coli (STEC) NOT DETECTED NOT DETECTED Final   Shigella/Enteroinvasive E coli (EIEC) NOT DETECTED NOT DETECTED Final   Cryptosporidium NOT DETECTED NOT DETECTED Final   Cyclospora cayetanensis NOT DETECTED NOT DETECTED Final   Entamoeba histolytica NOT DETECTED NOT DETECTED Final   Giardia [place] NOT DETECTED NOT  DETECTED Final   Adenovirus F40/41 NOT DETECTED NOT DETECTED Final   Astrovirus NOT DETECTED NOT DETECTED Final   Norovirus GI/GII NOT DETECTED NOT DETECTED Final   Rotavirus A NOT DETECTED NOT DETECTED Final   Sapovirus (I, II, IV, and V) NOT DETECTED NOT DETECTED Final    Comment: Performed at [facility], [address] [place], [place] [address]  Urine culture     Status: Abnormal   Collection Time: 02/21/20  3:19 PM   Specimen: In/Out Cath Urine  Result Value Ref Range Status   Specimen Description   Final    IN/OUT CATH URINE Performed at [facility], [address], [place], [place] [address]    Special Requests   Final    NONE Performed at [facility], [address] [place], [place] [address]    Culture MULTIPLE SPECIES PRESENT, SUGGEST  RECOLLECTION (A)  Final   Report Status 02/23/2020 FINAL  Final  Blood culture (routine x 2)     Status: None (Preliminary result)   Collection Time: 02/21/20  3:22 PM   Specimen: BLOOD RIGHT HAND  Result Value Ref Range Status   Specimen Description BLOOD RIGHT HAND  Final   Special Requests   Final    BOTTLES DRAWN AEROBIC ONLY Blood Culture results may not be optimal due to an inadequate volume of blood received in culture bottles   Culture   Final    NO GROWTH 2 DAYS Performed at [facility], [address] [place], [place] [address]    Report Status PENDING  Incomplete  C Difficile Quick Screen w PCR reflex     Status: None   Collection Time: 02/21/20  3:25 PM  Result Value Ref Range Status   C Diff antigen NEGATIVE NEGATIVE Final   C Diff toxin NEGATIVE NEGATIVE Final   C Diff interpretation No C. difficile detected.  Final    Comment: Performed at [facility], [address], [place], [place] [address]  Blood culture (routine x 2)     Status: None (Preliminary result)   Collection Time: 02/21/20  3:36 PM   Specimen: BLOOD  Result Value Ref Range Status   Specimen Description BLOOD  Final   Special Requests NONE  Final   Culture   Final    NO GROWTH 2 DAYS Performed at [facility], [address], [place], [place] [address]    Report Status PENDING  Incomplete  MRSA PCR Screening     Status: None   Collection Time: 02/21/20  9:50 PM   Specimen: Nasal Mucosa; Nasopharyngeal  Result Value Ref Range Status   MRSA by PCR NEGATIVE NEGATIVE Final    Comment:        The GeneXpert MRSA Assay (FDA approved for NASAL specimens only), is one component of a comprehensive MRSA colonization surveillance program. It is not intended to diagnose MRSA infection nor to guide or monitor treatment for MRSA infections. Performed at [facility], [address], [place], [place] [address]      Time coordinating discharge: 45 minutes The [place] controlled substances registry was  reviewed for this patient       SIGNED:   Edwin Dada, MD  [facility] 02/23/2020, 10:07 AM

## 2020-02-25 LAB — CULTURE, BLOOD (ROUTINE X 2): Culture: NO GROWTH

## 2020-02-26 ENCOUNTER — Telehealth (INDEPENDENT_AMBULATORY_CARE_PROVIDER_SITE_OTHER): Payer: Self-pay | Admitting: Primary Care

## 2020-02-26 ENCOUNTER — Other Ambulatory Visit: Payer: Self-pay

## 2020-02-26 ENCOUNTER — Encounter (INDEPENDENT_AMBULATORY_CARE_PROVIDER_SITE_OTHER): Payer: Self-pay | Admitting: Primary Care

## 2020-02-26 DIAGNOSIS — E11628 Type 2 diabetes mellitus with other skin complications: Secondary | ICD-10-CM

## 2020-02-26 DIAGNOSIS — F1010 Alcohol abuse, uncomplicated: Secondary | ICD-10-CM

## 2020-02-26 DIAGNOSIS — I10 Essential (primary) hypertension: Secondary | ICD-10-CM

## 2020-02-26 DIAGNOSIS — Z7689 Persons encountering health services in other specified circumstances: Secondary | ICD-10-CM

## 2020-02-26 DIAGNOSIS — Z794 Long term (current) use of insulin: Secondary | ICD-10-CM

## 2020-02-26 DIAGNOSIS — Z09 Encounter for follow-up examination after completed treatment for conditions other than malignant neoplasm: Secondary | ICD-10-CM

## 2020-02-26 NOTE — Progress Notes (Signed)
Telephone Note  I connected with Ryan Mcbride on 02/26/20 at  9:30 AM EST by telephone and verified that I am speaking with the correct person using two identifiers.  Location: Patient: is at home  Provider: Kerin Perna Np working from home   I discussed the limitations, risks, security and privacy concerns of performing an evaluation and management service by telephone and the availability of in person appointments. I also discussed with the patient that there may be a patient responsible charge related to this service. The patient expressed understanding and agreed to proceed.   History of Present Illness Ryan Mcbride is a 65 y.o.Hispanic male Ryan Mcbride [id number] Spanish interpretor , initially  both interpretor and PCP confused and his daughter Ryan Mcbride  with father permission also participated in visit.  Follow up from the hospital. Admit date to the hospital was 02/21/20, patient was discharged from the hospital on 02/23/20, patient was admitted for: Sepsis due osteomyelitis with Klebsiella, Enterococcus and E coli due to lapsed antibiotics. He is also establishing care. Per daughter no problems with n/v, diarrhea , constipation tolerating meals and liquids no problems at this time and confirmed appt with ID for follow up. Discussed uncontrolled diabetes and risk and variables in blood sugar during illness. Denies poly uria , dipsia, phagia or vision changes   Past Medical History:  Diagnosis Date  . Alcohol abuse   . Diabetes mellitus without complication ([facility])   . Hypertension   . Osteomyelitis ([place])      No Known Allergies    Current Outpatient Medications on File Prior to Visit  Medication Sig Dispense Refill  . folic acid (FOLVITE) 1 MG tablet Take 1 tablet (1 mg total) by mouth daily. 30 tablet 1  . insulin aspart protamine- aspart (NOVOLOG MIX 70/30) (70-30) 100 UNIT/ML injection Inject 0.14 mLs (14 Units total) into the skin 2 (two) times daily with a meal. 10 mL 11  .  Insulin Syringe 27G X 1/2" 1 ML MISC 1 application by Does not apply route 2 (two) times daily. 30 each 3  . lisinopril (ZESTRIL) 20 MG tablet Take 1 tablet (20 mg total) by mouth daily. 30 tablet 3  . metFORMIN (GLUCOPHAGE) 1000 MG tablet Take 1 tablet (1,000 mg total) by mouth 2 (two) times daily with a meal. 60 tablet 3  . Multiple Vitamin (MULTIVITAMIN WITH MINERALS) TABS tablet Take 1 tablet by mouth daily.    . piperacillin-tazobactam (ZOSYN) IVPB Inject 3.375 g into the vein every 8 (eight) hours for 28 days. As a continuous infusion. Indication:  Osteomyelitis of right hand  First Dose: Yes Last Day of Therapy:  02/29/2020 Labs - Once weekly:  CBC/D and BMP, Labs - Every other week:  ESR and CRP Method of administration: Elastomeric (Continuous infusion) Method of administration may be changed at the discretion of home infusion pharmacist based upon assessment of the patient and/or caregiver's ability to self-administer the medication ordered. 84 Units 0  . polyethylene glycol (MIRALAX / GLYCOLAX) 17 g packet Take 17 g by mouth daily as needed for mild constipation. 14 each 0  . thiamine 100 MG tablet Take 1 tablet (100 mg total) by mouth daily. 30 tablet 0   No current facility-administered medications on file prior to visit.    ROS: all negative except above.  Ryan Mcbride was seen today for hospitalization follow-up.  Diagnoses and all orders for this visit:  Encounter to establish care Establish care with PCP  Essential hypertension -  Counseled on blood pressure goal of less than 130/80, low-sodium, DASH diet, medication compliance, 150 minutes of moderate intensity exercise per week. Discussed medication compliance, adverse effects. Refilled . -     lisinopril (ZESTRIL) 20 MG tablet; Take 1 tablet (20 mg total) by mouth daily.  Alcohol abuse Per daughter has not had alcohol since hospital discharge   Type 2 diabetes mellitus with other skin complication, with long-term  current use of insulin (HCC) Very blunt conversation with daughter and father and interpretor  Complications from uncontrolled diabetes -diabetic retinopathy leading to blindness, diabetic nephropathy leading to dialysis, decrease in circulation decrease in sores or wound healing which may lead to amputations and increase of heart attack and stroke. Some complication experiencing verbalizing understanding and the need to controlled type 2 diabetes   Hospital discharge follow-up Retrieved from discharged note  [name], [name], MD. Go to.   Specialty: Infectious Diseases Why: jan 5 at 4pm Contact information: [address] [address] [phone]            Kerin Perna, NP. Go to.   Specialty: Internal Medicine Why: jan 4 at 9:30 am Contact information: [address]  [place] [address] [phone] Completed to monitor blood sugars bid f/u after d/c from ID with in 3 months re-evaluate DM tx            Kerin Perna, NP 10:51 AM    I discussed the assessment and treatment plan with the patient. The patient was provided an opportunity to ask questions and all were answered. The patient agreed with the plan and demonstrated an understanding of the instructions.   The patient was advised to call back or seek an in-person evaluation if the symptoms worsen or if the condition fails to improve as anticipated.  I provided 50  minutes of non-face-to-face time during this encounter.  Includes reviewing hospital encounters, labs, tx imaging Kerin Perna, NP

## 2020-02-27 ENCOUNTER — Telehealth: Payer: Self-pay

## 2020-02-27 ENCOUNTER — Ambulatory Visit (INDEPENDENT_AMBULATORY_CARE_PROVIDER_SITE_OTHER): Payer: Self-pay | Admitting: Infectious Disease

## 2020-02-27 ENCOUNTER — Encounter: Payer: Self-pay | Admitting: Infectious Disease

## 2020-02-27 ENCOUNTER — Telehealth: Payer: Self-pay | Admitting: Infectious Disease

## 2020-02-27 VITALS — BP 161/90 | HR 71 | Temp 97.5°F | Resp 16 | Ht 65.0 in | Wt 158.0 lb

## 2020-02-27 DIAGNOSIS — E11628 Type 2 diabetes mellitus with other skin complications: Secondary | ICD-10-CM

## 2020-02-27 DIAGNOSIS — A498 Other bacterial infections of unspecified site: Secondary | ICD-10-CM | POA: Insufficient documentation

## 2020-02-27 DIAGNOSIS — B952 Enterococcus as the cause of diseases classified elsewhere: Secondary | ICD-10-CM

## 2020-02-27 DIAGNOSIS — F1010 Alcohol abuse, uncomplicated: Secondary | ICD-10-CM

## 2020-02-27 DIAGNOSIS — T148XXA Other injury of unspecified body region, initial encounter: Secondary | ICD-10-CM

## 2020-02-27 DIAGNOSIS — L089 Local infection of the skin and subcutaneous tissue, unspecified: Secondary | ICD-10-CM

## 2020-02-27 DIAGNOSIS — M869 Osteomyelitis, unspecified: Secondary | ICD-10-CM

## 2020-02-27 DIAGNOSIS — S62609P Fracture of unspecified phalanx of unspecified finger, subsequent encounter for fracture with malunion: Secondary | ICD-10-CM

## 2020-02-27 DIAGNOSIS — Z794 Long term (current) use of insulin: Secondary | ICD-10-CM

## 2020-02-27 HISTORY — DX: Enterococcus as the cause of diseases classified elsewhere: B95.2

## 2020-02-27 HISTORY — DX: Other bacterial infections of unspecified site: A49.8

## 2020-02-27 LAB — CULTURE, BLOOD (ROUTINE X 2): Culture: NO GROWTH

## 2020-02-27 MED ORDER — LISINOPRIL 20 MG PO TABS
20.0000 mg | ORAL_TABLET | Freq: Every day | ORAL | 1 refills | Status: AC
Start: 2020-02-27 — End: ?

## 2020-02-27 MED ORDER — LISINOPRIL 20 MG PO TABS: 20.0000 mg | ORAL_TABLET | Freq: Every day | ORAL | 1 refills | Status: DC

## 2020-02-27 MED ORDER — AMOXICILLIN-POT CLAVULANATE 875-125 MG PO TABS: 1.0000 | ORAL_TABLET | Freq: Two times a day (BID) | ORAL | 1 refills | Status: DC

## 2020-02-27 NOTE — Telephone Encounter (Signed)
Lynn at [facility] is aware of the order to  PULL PICC after last dose on 02/29/20.He will communicate this to [facility] and have it pulled.

## 2020-02-27 NOTE — Telephone Encounter (Signed)
I was going to observe patient off of zosyn  NOW however having gone through his records again and clearly found that he had admission with fevers without clear source that would prefer for him to be on an oral antibiotic once he finishes his zosyn  I have therefore sent in augmentin to one of the pharmacies listed.  He should start this AFTER zosyn is completed

## 2020-02-27 NOTE — Progress Notes (Signed)
Subjective:   Chief complaint: followup for hand infection    Patient ID: Ryan Mcbride, male    DOB: September 12, 1955, 65 y.o.   MRN: [id number]  HPI   65 y.o. male with poorly controlled diabetes mellitus and alcoholism who is had multiple amputations who fell while drunk and walking about on his prosthetic leg.  He injured his hand and ultimately underwent reduction of PIP joint and treatment of a fracture with I&D in November 2021.  Cultures at that time it yielded methicillin sensitive staph coccus aureus group A streptococcus and Prevotella.  He had been treated with IV vancomycin and metronidazole and discharged on Augmentin.  He was then seen in follow-up with worsening purulence and necrosis from the wound.  Subsequent undergone amputation of the right small finger through the middle phalangeal joint with irrigation and debridement extending of the distal palm.  Cultures yielded E faecalis, E coli , Klebsiella and anerobes including beta lactamase + Prevotella.  He has been on zosyn and is approaching the end of 4 weeks of therapy.  In the interval he had developed fevers, chills and malaise and was admitted. In ER Temperature was 102. Apparently the patient had not been receiving his antibiotics for 48 hours prior to his admission. Blood cultures were negative.   CXR negative, CTA not revealing. He was treated with cefepime and flagyl as an inpatient and transitioned back to zosyn at DC.  He has an area on his finger that he says "needs to be drained." though it is not clear if this might rather be scar tissue.  He comes to clinic today accompanied by his daughter.   He says he has stopped drinking alcohol completely.      Past Medical History:  Diagnosis Date  . Alcohol abuse   . Diabetes mellitus without complication ([place])   . Hypertension   . Osteomyelitis [facility])     Past Surgical History:  Procedure Laterality Date  . amputation left leg    . I & D EXTREMITY Right  01/18/2020   Procedure: IRRIGATION AND DEBRIDEMENT SMALL FINGER  AND OPEN FRACTURE,,OPEN REDUCTION OF RIGHT PIP JOINT;  Surgeon: Verner Mould, MD;  Location: [place];  Service: Orthopedics;  Laterality: Right;  . INCISION AND DRAINAGE OF WOUND Right 01/30/2020   Procedure: Right small finger revision amputation, irrigation and debridement with wound vac placement;  Surgeon: Verner Mould, MD;  Location: [place];  Service: Orthopedics;  Laterality: Right;  21mn  . INCISION AND DRAINAGE OF WOUND Right 02/01/2020   Procedure: Right small finger repeat irrigation and debridement;  Surgeon: CVerner Mould MD;  [place]  Service: Orthopedics;  Laterality: Right;  621m    No family history on file.    Social History   Socioeconomic History  . Marital status: Divorced    Spouse name: Not on file  . Number of children: Not on file  . Years of education: Not on file  . Highest education level: Not on file  Occupational History  . Not on file  Tobacco Use  . Smoking status: Never Smoker  . Smokeless tobacco: Never Used  Substance and Sexual Activity  . Alcohol use: Yes  . Drug use: Never  . Sexual activity: Not on file  Other Topics Concern  . Not on file  Social History Narrative  . Not on file   Social Determinants of Health   Financial Resource Strain: Not on file  Food Insecurity:  Not on file  Transportation Needs: Not on file  Physical Activity: Not on file  Stress: Not on file  Social Connections: Not on file    No Known Allergies   Current Outpatient Medications:  .  folic acid (FOLVITE) 1 MG tablet, Take 1 tablet (1 mg total) by mouth daily., Disp: 30 tablet, Rfl: 1 .  insulin aspart protamine- aspart (NOVOLOG MIX 70/30) (70-30) 100 UNIT/ML injection, Inject 0.14 mLs (14 Units total) into the skin 2 (two) times daily with a meal., Disp: 10 mL, Rfl: 11 .  Insulin Syringe 27G X 1/2" 1 ML MISC, 1 application by Does not apply route 2 (two)  times daily., Disp: 30 each, Rfl: 3 .  lisinopril (ZESTRIL) 20 MG tablet, Take 1 tablet (20 mg total) by mouth daily., Disp: 90 tablet, Rfl: 1 .  metFORMIN (GLUCOPHAGE) 1000 MG tablet, Take 1 tablet (1,000 mg total) by mouth 2 (two) times daily with a meal., Disp: 60 tablet, Rfl: 3 .  Multiple Vitamin (MULTIVITAMIN WITH MINERALS) TABS tablet, Take 1 tablet by mouth daily., Disp: , Rfl:  .  piperacillin-tazobactam (ZOSYN) IVPB, Inject 3.375 g into the vein every 8 (eight) hours for 28 days. As a continuous infusion. Indication:  Osteomyelitis of right hand  First Dose: Yes Last Day of Therapy:  02/29/2020 Labs - Once weekly:  CBC/D and BMP, Labs - Every other week:  ESR and CRP Method of administration: Elastomeric (Continuous infusion) Method of administration may be changed at the discretion of home infusion pharmacist based upon assessment of the patient and/or caregiver's ability to self-administer the medication ordered., Disp: 84 Units, Rfl: 0 .  polyethylene glycol (MIRALAX / GLYCOLAX) 17 g packet, Take 17 g by mouth daily as needed for mild constipation., Disp: 14 each, Rfl: 0 .  thiamine 100 MG tablet, Take 1 tablet (100 mg total) by mouth daily., Disp: 30 tablet, Rfl: 0   Review of Systems  Constitutional: Positive for fever. Negative for activity change, appetite change, chills, diaphoresis, fatigue and unexpected weight change.  HENT: Negative for congestion, rhinorrhea, sinus pressure, sneezing, sore throat and trouble swallowing.   Eyes: Negative for photophobia and visual disturbance.  Respiratory: Negative for cough, chest tightness, shortness of breath, wheezing and stridor.   Cardiovascular: Negative for chest pain, palpitations and leg swelling.  Gastrointestinal: Negative for abdominal distention, abdominal pain, anal bleeding, blood in stool, constipation, diarrhea, nausea and vomiting.  Genitourinary: Negative for difficulty urinating, dysuria, flank pain and hematuria.   Musculoskeletal: Negative for arthralgias, back pain, gait problem, joint swelling and myalgias.  Skin: Positive for wound. Negative for color change, pallor and rash.  Neurological: Negative for dizziness, tremors, weakness and light-headedness.  Hematological: Negative for adenopathy. Does not bruise/bleed easily.  Psychiatric/Behavioral: Negative for agitation, behavioral problems, confusion, decreased concentration, dysphoric mood and sleep disturbance.       Objective:   Physical Exam Constitutional:      Appearance: He is well-developed and well-nourished.  HENT:     Head: Normocephalic and atraumatic.  Eyes:     Extraocular Movements: EOM normal.     Conjunctiva/sclera: Conjunctivae normal.  Cardiovascular:     Rate and Rhythm: Normal rate and regular rhythm.  Pulmonary:     Effort: Pulmonary effort is normal. No respiratory distress.     Breath sounds: No wheezing.  Abdominal:     General: There is no distension.     Palpations: Abdomen is soft.  Musculoskeletal:        General:  No tenderness or edema. Normal range of motion.     Cervical back: Normal range of motion and neck supple.  Skin:    General: Skin is warm and dry.     Coloration: Skin is not pale.     Findings: No erythema or rash.  Neurological:     General: No focal deficit present.     Mental Status: He is alert and oriented to person, place, and time.  Psychiatric:        Mood and Affect: Mood normal.        Behavior: Behavior normal.        Thought Content: Thought content normal.        Judgment: Judgment normal.     Left hand 02/27/2020:       PICC line 02/27/2020:         Assessment & Plan:   Polymicrobial hand infection with osteomyelitis sp surgery and now 4 weeks of zosyn (in 2 more days)  I was inclined to observe him off antibiotics but given his recent admission to the hospital with fevers without a clear focus I will have him start augmentin post finishing his IV zosyn and  continue this through his next visit with me  DM: following with IM clinic.

## 2020-02-28 LAB — FUNGUS CULTURE RESULT

## 2020-02-28 LAB — FUNGUS CULTURE WITH STAIN

## 2020-02-28 LAB — FUNGAL ORGANISM REFLEX

## 2020-02-28 NOTE — Telephone Encounter (Signed)
Perfect

## 2020-02-28 NOTE — Telephone Encounter (Addendum)
Called patient via [place] interpreters, Joelene Millin interpreter [phone]. Joelene Millin left message that Dr Daiva Eves sent antibiotics to [facility] for patient to start tomorrow. RN contacted [facility] in [place] to transfer the prescription, as patient has moved to [place]. Andree Coss, RN

## 2020-02-29 ENCOUNTER — Telehealth (INDEPENDENT_AMBULATORY_CARE_PROVIDER_SITE_OTHER): Payer: Self-pay | Admitting: General Practice

## 2020-02-29 NOTE — Telephone Encounter (Signed)
Home Health Verbal Orders - Caller/Agency: Elana with [employer] wellness check  She called saying they wanted to know if he is going to be seeing any of the providers at [facility] or [facility].  They gave him insulin but he will be needing to see some one asap for BP and blood glucose   Callback Number: [phone]

## 2020-03-24 ENCOUNTER — Encounter (HOSPITAL_COMMUNITY): Payer: Self-pay | Admitting: *Deleted

## 2020-03-24 ENCOUNTER — Ambulatory Visit: Payer: Self-pay | Admitting: *Deleted

## 2020-03-24 ENCOUNTER — Other Ambulatory Visit: Payer: Self-pay

## 2020-03-24 ENCOUNTER — Emergency Department (HOSPITAL_COMMUNITY): Payer: Self-pay

## 2020-03-24 ENCOUNTER — Other Ambulatory Visit: Payer: Self-pay | Admitting: General Practice

## 2020-03-24 ENCOUNTER — Emergency Department (HOSPITAL_COMMUNITY)
Admission: EM | Admit: 2020-03-24 | Discharge: 2020-03-24 | Disposition: A | Discharge status: Home / Self Care | Payer: Self-pay | Attending: Emergency Medicine | Admitting: Emergency Medicine

## 2020-03-24 DIAGNOSIS — T85618A Breakdown (mechanical) of other specified internal prosthetic devices, implants and grafts, initial encounter: Secondary | ICD-10-CM

## 2020-03-24 DIAGNOSIS — Z79899 Other long term (current) drug therapy: Secondary | ICD-10-CM | POA: Insufficient documentation

## 2020-03-24 DIAGNOSIS — I10 Essential (primary) hypertension: Secondary | ICD-10-CM | POA: Insufficient documentation

## 2020-03-24 DIAGNOSIS — R739 Hyperglycemia, unspecified: Secondary | ICD-10-CM

## 2020-03-24 DIAGNOSIS — Z7984 Long term (current) use of oral hypoglycemic drugs: Secondary | ICD-10-CM | POA: Insufficient documentation

## 2020-03-24 DIAGNOSIS — E1165 Type 2 diabetes mellitus with hyperglycemia: Secondary | ICD-10-CM | POA: Insufficient documentation

## 2020-03-24 DIAGNOSIS — H538 Other visual disturbances: Secondary | ICD-10-CM | POA: Insufficient documentation

## 2020-03-24 DIAGNOSIS — Z794 Long term (current) use of insulin: Secondary | ICD-10-CM | POA: Insufficient documentation

## 2020-03-24 DIAGNOSIS — F1010 Alcohol abuse, uncomplicated: Secondary | ICD-10-CM | POA: Insufficient documentation

## 2020-03-24 DIAGNOSIS — Z89512 Acquired absence of left leg below knee: Secondary | ICD-10-CM | POA: Insufficient documentation

## 2020-03-24 LAB — CBC
HCT: 46.7 % (ref 39.0–52.0)
Hemoglobin: 15.8 g/dL (ref 13.0–17.0)
MCH: 28.9 pg (ref 26.0–34.0)
MCHC: 33.8 g/dL (ref 30.0–36.0)
MCV: 85.5 fL (ref 80.0–100.0)
Platelets: 291 10*3/uL (ref 150–400)
RBC: 5.46 MIL/uL (ref 4.22–5.81)
RDW: 12.1 % (ref 11.5–15.5)
WBC: 9.1 10*3/uL (ref 4.0–10.5)
nRBC: 0 % (ref 0.0–0.2)

## 2020-03-24 LAB — BASIC METABOLIC PANEL
Anion gap: 8 (ref 5–15)
BUN: 20 mg/dL (ref 8–23)
CO2: 23 mmol/L (ref 22–32)
Calcium: 9.1 mg/dL (ref 8.9–10.3)
Chloride: 99 mmol/L (ref 98–111)
Creatinine, Ser: 1.12 mg/dL (ref 0.61–1.24)
GFR, Estimated: >60 mL/min (ref >60)
Glucose, Bld: 373 mg/dL — ABNORMAL HIGH (ref 70–99)
Potassium: 3.9 mmol/L (ref 3.5–5.1)
Sodium: 130 mmol/L — ABNORMAL LOW (ref 135–145)

## 2020-03-24 LAB — URINALYSIS, ROUTINE W REFLEX MICROSCOPIC
Bacteria, UA: NONE SEEN
Bilirubin Urine: NEGATIVE
Glucose, UA: >=500 mg/dL — AB
Hgb urine dipstick: NEGATIVE
Ketones, ur: NEGATIVE mg/dL
Leukocytes,Ua: NEGATIVE
Nitrite: NEGATIVE
Protein, ur: 100 mg/dL — AB
Specific Gravity, Urine: 1.022 (ref 1.005–1.030)
pH: 5 (ref 5.0–8.0)

## 2020-03-24 LAB — LIPASE, BLOOD: Lipase: 21 U/L (ref 11–51)

## 2020-03-24 LAB — HEPATIC FUNCTION PANEL
ALT: 17 U/L (ref 0–44)
AST: 14 U/L — ABNORMAL LOW (ref 15–41)
Albumin: 4.1 g/dL (ref 3.5–5.0)
Alkaline Phosphatase: 91 U/L (ref 38–126)
Bilirubin, Direct: 0.1 mg/dL (ref 0.0–0.2)
Indirect Bilirubin: 0.6 mg/dL (ref 0.3–0.9)
Total Bilirubin: 0.7 mg/dL (ref 0.3–1.2)
Total Protein: 8 g/dL (ref 6.5–8.1)

## 2020-03-24 LAB — CBG MONITORING, ED
Glucose-Capillary: 373 mg/dL — ABNORMAL HIGH (ref 70–99)
Glucose-Capillary: 338 mg/dL — ABNORMAL HIGH (ref 70–99)
Glucose-Capillary: 229 mg/dL — ABNORMAL HIGH (ref 70–99)

## 2020-03-24 LAB — AMMONIA: Ammonia: 13 umol/L (ref 9–35)

## 2020-03-24 MED ORDER — SODIUM CHLORIDE 0.9 % IV BOLUS
1000.0000 mL | Freq: Once | INTRAVENOUS | Status: AC
  Administered 2020-03-24: 1000 mL via INTRAVENOUS

## 2020-03-24 MED ORDER — SODIUM CHLORIDE 0.9 % IV SOLN: INTRAVENOUS | Status: DC

## 2020-03-24 NOTE — ED Provider Notes (Signed)
[facility] EMERGENCY DEPARTMENT Provider Note   CSN: [id number] Arrival date & time: 03/24/20  1459     History Chief Complaint  Patient presents with  . Hyperglycemia    Ryan Mcbride is a 65 y.o. male.  Patient out of Metformin for the past week.  Family member states he is unsteady on his feet stating he cannot see very well.  Patient is long-term diabetic.  Fairly noncompliant.  However in discussion with family members sounds like he does have Metformin at home.  Just may be not taking it as directed.  Past medical history of diabetes poorly controlled hypertension alcohol abuse.  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  Patient denies any pain.  Blood sugar initially 373.  CO2 was 23.  Sodium at 130 but that is due to the high blood sugar.  White blood cell count 9.1.  Hemoglobin 15.8.        Past Medical History:  Diagnosis Date  . Alcohol abuse   . Anaerobic bacterial infection 02/27/2020  . Diabetes mellitus without complication (HCC)   . E coli infection 02/27/2020  . Enterococcus faecalis infection 02/27/2020  . Hypertension   . Osteomyelitis [facility])     Patient Active Problem List   Diagnosis Date Noted  . Enterococcus faecalis infection 02/27/2020  . E coli infection 02/27/2020  . Klebsiella infection 02/27/2020  . Anaerobic bacterial infection 02/27/2020  . Severe sepsis (HCC) 02/21/2020  . Vomiting 02/21/2020  . Diarrhea 02/21/2020  . Lactic acidosis 02/21/2020  . Reflux esophagitis 02/21/2020  . Leukopenia 02/21/2020  . Hypokalemia 02/21/2020  . Altered mental status 02/21/2020  . Osteomyelitis of finger (HCC)   . Wound infection 01/30/2020  . Type 2 diabetes mellitus (HCC) 01/30/2020  . Cellulitis of finger of right hand 01/18/2020  . Closed fracture dislocation of proximal interphalangeal (PIP) joint of finger 01/18/2020  . Hyperglycemia due to diabetes mellitus (HCC) 01/18/2020  . Essential  hypertension 01/18/2020  . Alcohol abuse 01/18/2020  . Seizure [facility])     Past Surgical History:  Procedure Laterality Date  . amputation left leg    . I & D EXTREMITY Right 01/18/2020   Procedure: IRRIGATION AND DEBRIDEMENT SMALL FINGER  AND OPEN FRACTURE,,OPEN REDUCTION OF RIGHT PIP JOINT;  Surgeon: Ernest Mallick, MD;  Location: [facility] OR;  Service: Orthopedics;  Laterality: Right;  . INCISION AND DRAINAGE OF WOUND Right 01/30/2020   Procedure: Right small finger revision amputation, irrigation and debridement with wound vac placement;  Surgeon: Ernest Mallick, MD;  Location: [facility] OR;  Service: Orthopedics;  Laterality: Right;   . INCISION AND DRAINAGE OF WOUND Right 02/01/2020   Procedure: Right small finger repeat irrigation and debridement;  Surgeon: Ernest Mallick, MD;  Location: [facility] OR;  Service: Orthopedics;  Laterality: Right;        No family history on file.  Social History   Tobacco Use  . Smoking status: Never Smoker  . Smokeless tobacco: Never Used  Substance Use Topics  . Alcohol use: Yes  . Drug use: Never    Home Medications Prior to Admission medications   Medication Sig Start Date End Date Taking? Authorizing Provider  amoxicillin-clavulanate (AUGMENTIN) 875-125 MG tablet Take 1 tablet by mouth 2 (two) times daily. 02/27/20   Randall Hiss, MD  folic acid (FOLVITE) 1 MG tablet Take 1 tablet (1 mg total) by mouth daily. 01/22/20  Regalado, Belkys A, MD  insulin aspart protamine- aspart (NOVOLOG MIX 70/30) (70-30) 100 UNIT/ML injection Inject 0.14 mLs (14 Units total) into the skin 2 (two) times daily with a meal. 01/22/20   Regalado, Belkys A, MD  Insulin Syringe 27G X 1/2" 1 ML MISC 1 application by Does not apply route 2 (two) times daily. 01/22/20   Regalado, Belkys A, MD  lisinopril (ZESTRIL) 20 MG tablet Take 1 tablet (20 mg total) by mouth daily. 02/27/20   Grayce Sessions, NP  metFORMIN (GLUCOPHAGE) 1000 MG tablet Take  1 tablet (1,000 mg total) by mouth 2 (two) times daily with a meal. 01/22/20   Regalado, Belkys A, MD  Multiple Vitamin (MULTIVITAMIN WITH MINERALS) TABS tablet Take 1 tablet by mouth daily. 01/22/20   Regalado, Belkys A, MD  polyethylene glycol (MIRALAX / GLYCOLAX) 17 g packet Take 17 g by mouth daily as needed for mild constipation. 01/22/20   Regalado, Belkys A, MD  thiamine 100 MG tablet Take 1 tablet (100 mg total) by mouth daily. 01/23/20   Regalado, Prentiss Bells, MD    Allergies    Patient has no known allergies.  Review of Systems   Review of Systems  Constitutional: Negative for chills and fever.  HENT: Negative for ear pain, rhinorrhea and sore throat.   Eyes: Positive for visual disturbance. Negative for pain.  Respiratory: Negative for cough and shortness of breath.   Cardiovascular: Negative for chest pain, palpitations and leg swelling.  Gastrointestinal: Negative for abdominal pain, diarrhea, nausea and vomiting.  Genitourinary: Negative for dysuria and hematuria.  Musculoskeletal: Negative for arthralgias, back pain and neck pain.  Skin: Negative for color change and rash.  Neurological: Positive for weakness. Negative for dizziness, seizures, syncope, facial asymmetry, speech difficulty, light-headedness and headaches.  Hematological: Does not bruise/bleed easily.  Psychiatric/Behavioral: Negative for confusion.  All other systems reviewed and are negative.   Physical Exam Updated Vital Signs BP (!) 174/103   Pulse 68   Temp 97.6 F (36.4 C) (Oral)   Resp 20   SpO2 98%   Physical Exam Vitals and nursing note reviewed.  Constitutional:      General: He is not in acute distress.    Appearance: Normal appearance. He is well-developed and well-nourished.  HENT:     Head: Normocephalic and atraumatic.  Eyes:     Extraocular Movements: Extraocular movements intact.     Conjunctiva/sclera: Conjunctivae normal.     Pupils: Pupils are equal, round, and reactive to  light.  Cardiovascular:     Rate and Rhythm: Normal rate and regular rhythm.     Heart sounds: No murmur heard.   Pulmonary:     Effort: Pulmonary effort is normal. No respiratory distress.     Breath sounds: Normal breath sounds.  Abdominal:     Palpations: Abdomen is soft.     Tenderness: There is no abdominal tenderness.  Musculoskeletal:        General: No swelling or edema. Normal range of motion.     Cervical back: Neck supple.     Comments: Prosthesis to the left leg.  For below the knee amputation.  Well-healed amputation to the right little finger.  Skin:    General: Skin is warm and dry.  Neurological:     General: No focal deficit present.     Mental Status: He is alert.     Comments: Patient without any upper extremity weakness or lower extremity weakness.  No significant decreased vision.  Able to correctly identify appropriate number of fingers held up with HI covered.  Psychiatric:        Mood and Affect: Mood and affect normal.     ED Results / Procedures / Treatments   Labs (all labs ordered are listed, but only abnormal results are displayed) Labs Reviewed  BASIC METABOLIC PANEL - Abnormal; Notable for the following components:      Result Value   Sodium 130 (*)    Glucose, Bld 373 (*)    All other components within normal limits  URINALYSIS, ROUTINE W REFLEX MICROSCOPIC - Abnormal; Notable for the following components:   Glucose, UA >=500 (*)    Protein, ur 100 (*)    All other components within normal limits  HEPATIC FUNCTION PANEL - Abnormal; Notable for the following components:   AST 14 (*)    All other components within normal limits  CBG MONITORING, ED - Abnormal; Notable for the following components:   Glucose-Capillary 373 (*)    All other components within normal limits  CBG MONITORING, ED - Abnormal; Notable for the following components:   Glucose-Capillary 338 (*)    All other components within normal limits  CBG MONITORING, ED -  Abnormal; Notable for the following components:   Glucose-Capillary 229 (*)    All other components within normal limits  CBC  AMMONIA  LIPASE, BLOOD  CBG MONITORING, ED    EKG EKG Interpretation  Date/Time:  Monday March 24 2020 18:35:57 EST Ventricular Rate:  76 PR Interval:    QRS Duration: 74 QT Interval:  374 QTC Calculation: 421 R Axis:   46 Text Interpretation: Sinus rhythm Confirmed by Vanetta Mulders [phone]) on 03/24/2020 7:32:57 PM   Radiology CT Head Wo Contrast  Result Date: 03/24/2020 CLINICAL DATA:  Altered mental status. Recent fall, patient denies striking head. EXAM: CT HEAD WITHOUT CONTRAST TECHNIQUE: Contiguous axial images were obtained from the base of the skull through the vertex without intravenous contrast. COMPARISON:  Head CT 02/21/2020 FINDINGS: Brain: No intracranial hemorrhage, mass effect, or midline shift. No hydrocephalus. The basilar cisterns are patent. Stable degree of atrophy and chronic small vessel ischemia. 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 No evidence of territorial infarct or acute ischemia. No extra-axial or intracranial fluid collection. Vascular: Atherosclerosis of skullbase vasculature without hyperdense vessel or abnormal calcification. Skull: No fracture or focal lesion. Sinuses/Orbits: No acute findings. Chronic sclerosis and opacification of lower right mastoid air cells. Paranasal sinuses are well aerated Other: None. IMPRESSION: 1. No acute intracranial abnormality. 2. Stable atrophy and chronic small vessel ischemia. 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 Electronically Signed   By: Narda Rutherford M.D.   On: 03/24/2020 20:51   DG Chest Port 1 View  Result Date: 03/24/2020 CLINICAL DATA:  Altered mental status EXAM: PORTABLE CHEST 1 VIEW COMPARISON:  02/21/2020 FINDINGS: Cardiac shadow is mildly enlarged but accentuated by the portable technique. The lungs are well aerated bilaterally. No focal  infiltrate or effusion is seen. No bony abnormality is noted. IMPRESSION: No acute abnormality seen. Electronically Signed   By: Alcide Clever M.D.   On: 03/24/2020 20:03    Procedures Procedures   Medications Ordered in ED Medications  0.9 %  sodium chloride infusion ( Intravenous New Bag/Given 03/24/20 1937)  sodium chloride 0.9 % bolus 1,000 mL (0 mLs Intravenous Stopped 03/24/20 2029)    ED Course  I have reviewed the triage vital signs and the nursing notes.  Pertinent labs &  imaging results that were available during my care of the patient were reviewed by me and considered in my medical decision making (see chart for details).    MDM Rules/Calculators/A&P                         Patient's hyperglycemia corrected with IV fluids.  Blood sugar came down below 300.  No evidence of any DKA.  Renal function was normal.  Initially was concerned about him being out of Metformin.  But appears that he does have that at home.  Head CT was done because of the difficulty with gait.  No acute findings.  Was evidence of remote lacunar infarcts.  Chest x-ray without any acute findings.  Was considering MRI.  Not available tonight.  But then we ambulated him.  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  1 week with a temporary lock in place he walked quite well.  We think that there is a malfunction of his prosthesis and probably needs to be replaced.  Feel that this is his main concern for difficulty walking.  Also there could be some visual disturbance.  But no gross visual disturbance.  Given information about follow-up with ophthalmology for this.  And follow back up with his primary care doctor for better blood sugar control.  Discussed with patient's daughter and patient arrange for discharge home.  Final Clinical Impression(s) / ED Diagnoses Final diagnoses:   Hyperglycemia  Breakage of prosthesis, initial encounter    Rx / DC Orders ED Discharge Orders    None       Vanetta Mulders, MD 03/25/20 [phone]

## 2020-03-24 NOTE — Telephone Encounter (Signed)
PEC sent as Lorain Childes

## 2020-03-24 NOTE — Telephone Encounter (Signed)
Medication: metFORMIN (GLUCOPHAGE) 1000 MG tablet [[id number]]   Has the patient contacted their pharmacy? YES  (Agent: If no, request that the patient contact the pharmacy for the refill.) (Agent: If yes, when and what did the pharmacy advise?)  Preferred Pharmacy (with phone number or street name): [facility] [address] [place], [place] [address] [phone]  Agent: Please be advised that RX refills may take up to 3 business days. We ask that you follow-up with your pharmacy.

## 2020-03-24 NOTE — ED Notes (Signed)
Pt ambulatory with assistance. 

## 2020-03-24 NOTE — Telephone Encounter (Signed)
Requested medication (s) are due for refill today: Yes  Requested medication (s) are on the active medication list: Yes  Last refill:  12/2019  Future visit scheduled: No  Notes to clinic:  Unable to refill per protocol, last refill by another provider.      Requested Prescriptions  Pending Prescriptions Disp Refills   metFORMIN (GLUCOPHAGE) 1000 MG tablet 60 tablet 3    Sig: Take 1 tablet (1,000 mg total) by mouth 2 (two) times daily with a meal.      Endocrinology:  Diabetes - Biguanides Failed - 03/24/2020  2:33 PM      Failed - HBA1C is between 0 and 7.9 and within 180 days    Hgb A1c MFr Bld  Date Value Ref Range Status  02/22/2020 9.5 (H) 4.8 - 5.6 % Final    Comment:    (NOTE) Pre diabetes:          5.7%-6.4%  Diabetes:              >6.4%  Glycemic control for   <7.0% adults with diabetes           Failed - eGFR in normal range and within 360 days    GFR calc Af Amer  Date Value Ref Range Status  06/22/2017 >60 >60 mL/min Final    Comment:    (NOTE) The eGFR has been calculated using the CKD EPI equation. This calculation has not been validated in all clinical situations. eGFR's persistently <60 mL/min signify possible Chronic Kidney Disease.    GFR, Estimated  Date Value Ref Range Status  02/22/2020 >60 >60 mL/min Final    Comment:    (NOTE) Calculated using the CKD-EPI Creatinine Equation (2021)           Passed - Cr in normal range and within 360 days    Creatinine, Ser  Date Value Ref Range Status  02/22/2020 0.98 0.61 - 1.24 mg/dL Final          Passed - Valid encounter within last 6 months    Recent Outpatient Visits           3 weeks ago Encounter to establish care   [place], [place], NP       Future Appointments             In 1 week [name], [name], MD [facility]

## 2020-03-24 NOTE — Telephone Encounter (Signed)
  Patient's daughter called to report patient has been acting more confused x 2 days. Patient is currently staying at daughter's mother's and mother reports patient has been out of metformin medication x 1 week. Unable to get a blood glucose reading. Symptoms of increased thirst, frequent urination, confusion.  Patient's daughter reports patient acting differently x 2 days . Reports a fall that was unwitnessed yesterday and thinks patient hit his head. Patient reports he could not see yesterday and not acting normal.  Last normal day before yesterday. Instructed patient's daughter patient should go to ED and to call 911 to get ambulance to take to ED. Patient 's daughter verbalized understanding .  Reason for Disposition . [1] Difficult to awaken or acting confused (e.g., disoriented, slurred speech) AND [2] present now AND [3] new-onset  Answer Assessment - Initial Assessment Questions 1. LEVEL OF CONSCIOUSNESS: "How is he (she, the patient) acting right now?" (e.g., alert-oriented, confused, lethargic, stuporous, comatose)     More confused 2. ONSET: "When did the confusion start?"  (minutes, hours, days)     2 days ago  3. PATTERN "Does this come and go, or has it been constant since it started?"  "Is it present now?"     Constant but less confused today  4. ALCOHOL or DRUGS: "Has he been drinking alcohol or taking any drugs?"      no 5. NARCOTIC MEDICATIONS: "Has he been receiving any narcotic medications?" (e.g., morphine, Vicodin)     no 6. CAUSE: "What do you think is causing the confusion?"      Not sure, patient fell yesterday and hit head, possible elevated blood glucose. 7. OTHER SYMPTOMS: "Are there any other symptoms?" (e.g., difficulty breathing, headache, fever, weakness)     Out of metformin medication x 1 week. Balance is off. Frequent urination and increased thirst.  Protocols used: CONFUSION - DELIRIUM-A-AH

## 2020-03-24 NOTE — ED Notes (Signed)
Called pts daughter and updated her, states she will be on her way to pick up pt.

## 2020-03-24 NOTE — Discharge Instructions (Addendum)
Prosthesis is not fitting properly.  The screw has been stripped and it rotates is probably the cause for difficulty with walking.  Work-up here without any acute findings.  Blood sugars came down with IV fluids into the 200 range.  In addition there may be some visual changes but grossly his vision is intact.  Would recommend following up with his prosthetic doctor and as well as ophthalmology ophthalmology on call information provided.

## 2020-03-24 NOTE — ED Notes (Signed)
Discharge instructions reviewed with daughter.

## 2020-03-24 NOTE — ED Triage Notes (Signed)
Out of metformin for the past week, family member states he is unsteady on feet and stating he cannot see very well

## 2020-03-25 NOTE — Telephone Encounter (Signed)
Pt's daughter called in to follow up on refill request for metFORMIN (GLUCOPHAGE) 1000 MG tablet, daughter says that prescribing Dr is a ED provider. Daughter would like to know when medication is sent in to pharmacy.

## 2020-03-26 MED ORDER — METFORMIN HCL 1000 MG PO TABS: 1000.0000 mg | ORAL_TABLET | Freq: Two times a day (BID) | ORAL | 1 refills | Status: DC

## 2020-03-27 NOTE — Telephone Encounter (Addendum)
Pt daughter is calling and her dad has been with out metformin for about a wk and she took him back to er and they did not give him a refill on metformin

## 2020-03-31 MED ORDER — METFORMIN HCL 1000 MG PO TABS: 1000.0000 mg | ORAL_TABLET | Freq: Two times a day (BID) | ORAL | 1 refills | Status: DC

## 2020-03-31 NOTE — Telephone Encounter (Signed)
PTs daughter following up on prescription, PT is requesting an emergency supply.   Medication Refill - Medication: metFORMIN (GLUCOPHAGE) 1000 MG tablet   Preferred Pharmacy (with phone number or street name):  [id number] - [place], [place] - [address]  [address], [place] [address]

## 2020-03-31 NOTE — Addendum Note (Signed)
Addended by: Lannie Fields on: 03/31/2020 02:13 PM   Modules accepted: Orders

## 2020-04-02 ENCOUNTER — Ambulatory Visit: Payer: Self-pay | Admitting: Infectious Disease

## 2020-04-04 ENCOUNTER — Other Ambulatory Visit: Payer: Self-pay

## 2020-04-04 ENCOUNTER — Telehealth (INDEPENDENT_AMBULATORY_CARE_PROVIDER_SITE_OTHER): Payer: Self-pay

## 2020-04-04 MED ORDER — METFORMIN HCL 1000 MG PO TABS: 1000.0000 mg | ORAL_TABLET | Freq: Two times a day (BID) | ORAL | 1 refills | Status: AC

## 2020-04-04 NOTE — Telephone Encounter (Signed)
Patient is aware medication has been sent to pharmacy. 

## 2020-04-04 NOTE — Telephone Encounter (Signed)
Copied from CRM [id number]. Topic: General - Other >> Apr 04, 2020 10:20 AM Jaquita Rector A wrote: Reason for CRM: Patient daughter Felizardo Hoffmann called in trying to get a refill on the patients medication  metFORMIN (GLUCOPHAGE) 1000 MG tablet Rx shows that on 03/31/20 Dr Alvis Lemmings tried sending Rx to the pharmacy but it was printed and not sent electronically. Also patient ended up in the ER with high blood sugar from not having his medication. Please send Rx to [facility] [address], [place] - [address] [place] [address]     Phone:  [phone]  Fax:  [phone]  and please call Yoshi to inform Ph# [phone]

## 2020-04-04 NOTE — Telephone Encounter (Signed)
Please inform patient that medication has been sent to his pharmacy.

## 2020-05-08 ENCOUNTER — Ambulatory Visit (HOSPITAL_COMMUNITY)
Admission: RE | Admit: 2020-05-08 | Discharge: 2020-05-08 | Disposition: A | Discharge status: Home / Self Care | Payer: Self-pay | Attending: Psychiatry | Admitting: Psychiatry

## 2020-05-08 ENCOUNTER — Other Ambulatory Visit: Payer: Self-pay

## 2020-05-08 ENCOUNTER — Encounter (HOSPITAL_COMMUNITY): Payer: Self-pay

## 2020-05-08 ENCOUNTER — Emergency Department (HOSPITAL_COMMUNITY)
Admission: EM | Admit: 2020-05-08 | Discharge: 2020-05-09 | Disposition: A | Discharge status: Home / Self Care | Payer: Self-pay | Attending: Emergency Medicine | Admitting: Emergency Medicine

## 2020-05-08 DIAGNOSIS — Z794 Long term (current) use of insulin: Secondary | ICD-10-CM | POA: Insufficient documentation

## 2020-05-08 DIAGNOSIS — R739 Hyperglycemia, unspecified: Secondary | ICD-10-CM

## 2020-05-08 DIAGNOSIS — E1165 Type 2 diabetes mellitus with hyperglycemia: Secondary | ICD-10-CM | POA: Insufficient documentation

## 2020-05-08 DIAGNOSIS — Y900 Blood alcohol level of less than 20 mg/100 ml: Secondary | ICD-10-CM | POA: Insufficient documentation

## 2020-05-08 DIAGNOSIS — Z20822 Contact with and (suspected) exposure to covid-19: Secondary | ICD-10-CM | POA: Insufficient documentation

## 2020-05-08 DIAGNOSIS — Z7984 Long term (current) use of oral hypoglycemic drugs: Secondary | ICD-10-CM | POA: Insufficient documentation

## 2020-05-08 DIAGNOSIS — F10129 Alcohol abuse with intoxication, unspecified: Secondary | ICD-10-CM | POA: Insufficient documentation

## 2020-05-08 DIAGNOSIS — I10 Essential (primary) hypertension: Secondary | ICD-10-CM | POA: Insufficient documentation

## 2020-05-08 LAB — RESP PANEL BY RT-PCR (FLU A&B, COVID) ARPGX2
Influenza A by PCR: NEGATIVE
Influenza B by PCR: NEGATIVE
SARS Coronavirus 2 by RT PCR: NEGATIVE

## 2020-05-08 LAB — CBG MONITORING, ED: Glucose-Capillary: 227 mg/dL — ABNORMAL HIGH (ref 70–99)

## 2020-05-08 MED ORDER — CHLORDIAZEPOXIDE HCL 25 MG PO CAPS
25.0000 mg | ORAL_CAPSULE | Freq: Once | ORAL | Status: AC
  Administered 2020-05-08: 25 mg via ORAL
  Filled 2020-05-08: qty 1

## 2020-05-08 NOTE — ED Triage Notes (Signed)
Pt arrives EMS from [facility] for medical clearance. C/o suicidal ideation and alcohol intoxication.

## 2020-05-08 NOTE — ED Provider Notes (Signed)
[facility]-EMERGENCY DEPT Provider Note   CSN: [id number] Arrival date & time: 05/08/20  2042     History Chief Complaint  Patient presents with  . Medical Clearance    Ryan Mcbride is a 65 y.o. male.  65 yo M with a chief complaints of alcohol intoxication.  Patient checked in the behavioral health urgent care and was found to be hyperglycemic and intoxicated.  Sent here for medical clearance.  Patient tells me that he was there is a friend and just wanted his blood sugar checked while he was there.  He is intoxicated and so I have trouble obtaining further history.  Level 5 caveat.        Past Medical History:  Diagnosis Date  . Alcohol abuse   . Anaerobic bacterial infection 02/27/2020  . Diabetes mellitus without complication (HCC)   . E coli infection 02/27/2020  . Enterococcus faecalis infection 02/27/2020  . Hypertension   . Osteomyelitis [facility])     Patient Active Problem List   Diagnosis Date Noted  . Enterococcus faecalis infection 02/27/2020  . E coli infection 02/27/2020  . Klebsiella infection 02/27/2020  . Anaerobic bacterial infection 02/27/2020  . Severe sepsis (HCC) 02/21/2020  . Vomiting 02/21/2020  . Diarrhea 02/21/2020  . Lactic acidosis 02/21/2020  . Reflux esophagitis 02/21/2020  . Leukopenia 02/21/2020  . Hypokalemia 02/21/2020  . Altered mental status 02/21/2020  . Osteomyelitis of finger (HCC)   . Wound infection 01/30/2020  . Type 2 diabetes mellitus (HCC) 01/30/2020  . Cellulitis of finger of right hand 01/18/2020  . Closed fracture dislocation of proximal interphalangeal (PIP) joint of finger 01/18/2020  . Hyperglycemia due to diabetes mellitus (HCC) 01/18/2020  . Essential hypertension 01/18/2020  . Alcohol abuse 01/18/2020  . Seizure [facility])     Past Surgical History:  Procedure Laterality Date  . amputation left leg    . I & D EXTREMITY Right 01/18/2020   Procedure: IRRIGATION AND DEBRIDEMENT SMALL FINGER  AND  OPEN FRACTURE,,OPEN REDUCTION OF RIGHT PIP JOINT;  Surgeon: Ernest Mallick, MD;  Location: [facility] OR;  Service: Orthopedics;  Laterality: Right;  . INCISION AND DRAINAGE OF WOUND Right 01/30/2020   Procedure: Right small finger revision amputation, irrigation and debridement with wound vac placement;  Surgeon: Ernest Mallick, MD;  Location: [facility] OR;  Service: Orthopedics;  Laterality: Right;   . INCISION AND DRAINAGE OF WOUND Right 02/01/2020   Procedure: Right small finger repeat irrigation and debridement;  Surgeon: Ernest Mallick, MD;  Location: [facility] OR;  Service: Orthopedics;  Laterality: Right;        No family history on file.  Social History   Tobacco Use  . Smoking status: Never Smoker  . Smokeless tobacco: Never Used  Substance Use Topics  . Alcohol use: Yes  . Drug use: Never    Home Medications Prior to Admission medications   Medication Sig Start Date End Date Taking? Authorizing Provider  folic acid (FOLVITE) 1 MG tablet Take 1 tablet (1 mg total) by mouth daily. 01/22/20   Regalado, Belkys A, MD  insulin aspart protamine- aspart (NOVOLOG MIX 70/30) (70-30) 100 UNIT/ML injection Inject 0.14 mLs (14 Units total) into the skin 2 (two) times daily with a meal. 01/22/20   Regalado, Belkys A, MD  Insulin Syringe 27G X 1/2" 1 ML MISC 1 application by Does not apply route 2 (two) times daily. 01/22/20   Regalado, Prentiss Bells, MD  lisinopril (ZESTRIL)  20 MG tablet Take 1 tablet (20 mg total) by mouth daily. 02/27/20   Grayce Sessions, NP  metFORMIN (GLUCOPHAGE) 1000 MG tablet Take 1 tablet (1,000 mg total) by mouth 2 (two) times daily with a meal. 04/04/20   Grayce Sessions, NP  Multiple Vitamin (MULTIVITAMIN WITH MINERALS) TABS tablet Take 1 tablet by mouth daily. 01/22/20   Regalado, Belkys A, MD  polyethylene glycol (MIRALAX / GLYCOLAX) 17 g packet Take 17 g by mouth daily as needed for mild constipation. 01/22/20   Regalado, Belkys A, MD  thiamine  100 MG tablet Take 1 tablet (100 mg total) by mouth daily. 01/23/20   Regalado, Prentiss Bells, MD    Allergies    Patient has no known allergies.  Review of Systems   Review of Systems  Unable to perform ROS: Mental status change    Physical Exam Updated Vital Signs BP 135/90   Pulse 84   Temp 98.2 F (36.8 C) (Oral)   Resp 18   Ht 5\' 5"  (1.651 m)   Wt 71.7 kg   SpO2 96%   BMI 26.29 kg/m   Physical Exam Vitals and nursing note reviewed.  Constitutional:      Appearance: He is well-developed.     Comments: Smells of etoh  HENT:     Head: Normocephalic and atraumatic.  Eyes:     Pupils: Pupils are equal, round, and reactive to light.  Neck:     Vascular: No JVD.  Cardiovascular:     Rate and Rhythm: Normal rate and regular rhythm.     Heart sounds: No murmur heard. No friction rub. No gallop.   Pulmonary:     Effort: No respiratory distress.     Breath sounds: No wheezing.  Abdominal:     General: There is no distension.     Tenderness: There is no abdominal tenderness. There is no guarding or rebound.  Musculoskeletal:        General: Normal range of motion.     Cervical back: Normal range of motion and neck supple.  Skin:    Coloration: Skin is not pale.     Findings: No rash.  Neurological:     Mental Status: He is alert and oriented to person, place, and time.  Psychiatric:        Behavior: Behavior normal.     ED Results / Procedures / Treatments   Labs (all labs ordered are listed, but only abnormal results are displayed) Labs Reviewed  COMPREHENSIVE METABOLIC PANEL - Abnormal; Notable for the following components:      Result Value   CO2 20 (*)    Glucose, Bld 224 (*)    Total Bilirubin 1.4 (*)    All other components within normal limits  ACETAMINOPHEN LEVEL - Abnormal; Notable for the following components:   Acetaminophen (Tylenol), Serum <10 (*)    All other components within normal limits  SALICYLATE LEVEL - Abnormal; Notable for the following  components:   Salicylate Lvl <7.0 (*)    All other components within normal limits  CBG MONITORING, ED - Abnormal; Notable for the following components:   Glucose-Capillary 227 (*)    All other components within normal limits  RESP PANEL BY RT-PCR (FLU A&B, COVID) ARPGX2  ETHANOL  RAPID URINE DRUG SCREEN, HOSP PERFORMED  CBC WITH DIFFERENTIAL/PLATELET    EKG EKG Interpretation  Date/Time:  Thursday May 08 2020 21:23:22 EDT Ventricular Rate:  86 PR Interval:  140 QRS Duration: 74  QT Interval:  346 QTC Calculation: 414 R Axis:   85 Text Interpretation: Normal sinus rhythm Normal ECG Confirmed by Alona Bene [phone]) on 05/09/2020 1:32:47 PM   Radiology No results found.  Procedures Procedures   Medications Ordered in ED Medications  chlordiazePOXIDE (LIBRIUM) capsule 25 mg (25 mg Oral Given 05/08/20 2117)    ED Course  I have reviewed the triage vital signs and the nursing notes.  Pertinent labs & imaging results that were available during my care of the patient were reviewed by me and considered in my medical decision making (see chart for details).    MDM Rules/Calculators/A&P                          65 yo M with a chief complaints of alcohol intoxication.  Patient was sent over from [facility] urgent care for medical clearance.  Had a high blood sugar.  Awaiting blood work.  TTS eval.   The patients results and plan were reviewed and discussed.   Any x-rays performed were independently reviewed by myself.   Differential diagnosis were considered with the presenting HPI.  Medications  chlordiazePOXIDE (LIBRIUM) capsule 25 mg (25 mg Oral Given 05/08/20 2117)    Vitals:   05/08/20 2048 05/09/20 0231 05/09/20 0600 05/09/20 0752  BP: (!) 158/101 (!) 150/86 (!) 138/93 135/90  Pulse: 86 84 85 84  Resp: 18 20 18 18   Temp: 98.4 F (36.9 C) 98.2 F (36.8 C)    TempSrc: Oral Oral    SpO2: 94% 97% 95% 96%  Weight: 71.7 kg     Height: 5\' 5"  (1.651 m)       Final  diagnoses:  Hyperglycemia     Final Clinical Impression(s) / ED Diagnoses Final diagnoses:  Hyperglycemia    Rx / DC Orders ED Discharge Orders    None       , DO 05/09/20 1505

## 2020-05-08 NOTE — H&P (Signed)
Behavioral Health Medical Screening Exam  Ryan Mcbride is an 65 y.o. male who presents to [facility] voluntarily with family member for assessment of depression, alcohol use. On assessment patient is actively intoxicated, slurred speech, with several physical complaints. Based on patient presentation, patient was transferred to ED for further medical clearance.   Total Time spent with patient: 15 minutes  Psychiatric Specialty Exam: Physical Exam Review of Systems Blood pressure (!) 157/98, pulse 87, temperature 98.6 F (37 C), temperature source Oral, SpO2 100 %.There is no height or weight on file to calculate BMI. General Appearance: Disheveled Eye Contact:  Poor Speech:  Slurred Volume:  Normal Mood:  unable to assess due to intoxicated state Affect:  unable to assess due to intoxicated state Thought Process:  NA unable to assess due to intoxicated state Orientation:  Other:  unable to assess due to intoxicated state Thought Content:  unable to assess due to intoxicated state Suicidal Thoughts:  unable to assess due to intoxicated state Homicidal Thoughts:  unable to assess due to intoxicated state Memory:  unable to assess due to intoxicated state Judgement:  Impaired Insight:  unable to assess due to intoxicated state Psychomotor Activity:  unsteady gait Concentration: Concentration: unable to assess due to intoxicated state and Attention Span: unable to assess due to intoxicated state Recall:  unable to assess due to intoxicated state Fund of Knowledge:unable to assess due to intoxicated state Language: unable to assess due to intoxicated state Akathisia:  unable to assess due to intoxicated state Handed:   AIMS (if indicated):    Assets:  Resilience Social Support Sleep:     Musculoskeletal: Strength & Muscle Tone: unable to assess due to intoxicated state Gait & Station: unable to assess due to intoxicated state Patient leans: unable to assess due to intoxicated  state  Blood pressure (!) 157/98, pulse 87, temperature 98.6 F (37 C), temperature source Oral, SpO2 100 %.  Recommendations: Based on my evaluation the patient appears to have an emergency medical condition for which I recommend the patient be transferred to the emergency department for further evaluation.  Loletta Parish, NP 05/08/2020, 9:31 PM

## 2020-05-09 LAB — RAPID URINE DRUG SCREEN, HOSP PERFORMED
Amphetamines: NOT DETECTED
Barbiturates: NOT DETECTED
Benzodiazepines: NOT DETECTED
Cocaine: NOT DETECTED
Opiates: NOT DETECTED
Tetrahydrocannabinol: NOT DETECTED

## 2020-05-09 LAB — CBC WITH DIFFERENTIAL/PLATELET
Abs Immature Granulocytes: 0.01 10*3/uL (ref 0.00–0.07)
Basophils Absolute: 0.1 10*3/uL (ref 0.0–0.1)
Basophils Relative: 1 %
Eosinophils Absolute: 0.1 10*3/uL (ref 0.0–0.5)
Eosinophils Relative: 2 %
HCT: 41.9 % (ref 39.0–52.0)
Hemoglobin: 14.7 g/dL (ref 13.0–17.0)
Immature Granulocytes: 0 %
Lymphocytes Relative: 37 %
Lymphs Abs: 2.3 10*3/uL (ref 0.7–4.0)
MCH: 29.3 pg (ref 26.0–34.0)
MCHC: 35.1 g/dL (ref 30.0–36.0)
MCV: 83.6 fL (ref 80.0–100.0)
Monocytes Absolute: 0.5 10*3/uL (ref 0.1–1.0)
Monocytes Relative: 8 %
Neutro Abs: 3.3 10*3/uL (ref 1.7–7.7)
Neutrophils Relative %: 52 %
Platelets: 240 10*3/uL (ref 150–400)
RBC: 5.01 MIL/uL (ref 4.22–5.81)
RDW: 13 % (ref 11.5–15.5)
WBC: 6.4 10*3/uL (ref 4.0–10.5)
nRBC: 0 % (ref 0.0–0.2)

## 2020-05-09 LAB — COMPREHENSIVE METABOLIC PANEL
ALT: 15 U/L (ref 0–44)
AST: 16 U/L (ref 15–41)
Albumin: 3.9 g/dL (ref 3.5–5.0)
Alkaline Phosphatase: 86 U/L (ref 38–126)
Anion gap: 14 (ref 5–15)
BUN: 11 mg/dL (ref 8–23)
CO2: 20 mmol/L — ABNORMAL LOW (ref 22–32)
Calcium: 9.1 mg/dL (ref 8.9–10.3)
Chloride: 102 mmol/L (ref 98–111)
Creatinine, Ser: 0.81 mg/dL (ref 0.61–1.24)
GFR, Estimated: >60 mL/min (ref >60)
Glucose, Bld: 224 mg/dL — ABNORMAL HIGH (ref 70–99)
Potassium: 3.9 mmol/L (ref 3.5–5.1)
Sodium: 136 mmol/L (ref 135–145)
Total Bilirubin: 1.4 mg/dL — ABNORMAL HIGH (ref 0.3–1.2)
Total Protein: 7.6 g/dL (ref 6.5–8.1)

## 2020-05-09 LAB — ACETAMINOPHEN LEVEL: Acetaminophen (Tylenol), Serum: <10 ug/mL — ABNORMAL LOW (ref 10–30)

## 2020-05-09 LAB — SALICYLATE LEVEL: Salicylate Lvl: <7 mg/dL — ABNORMAL LOW (ref 7.0–30.0)

## 2020-05-09 LAB — ETHANOL: Alcohol, Ethyl (B): <10 mg/dL (ref <10)

## 2020-05-09 NOTE — ED Notes (Signed)
Light green, dark green and lavender tubes sent to lab.

## 2020-05-09 NOTE — ED Notes (Signed)
Spoke with daughter, states she would be able to get patient around noon today.

## 2020-05-09 NOTE — ED Notes (Signed)
Spoke with Felizardo Hoffmann (daughter), states she is unable to get patient and states her sister isn't able to either. Daughter states patient usually stays with someone is [place] but not sure where.

## 2020-05-09 NOTE — ED Notes (Signed)
Lab called blood being processed at this time

## 2020-05-09 NOTE — ED Notes (Signed)
Lab called to follow up on pending labs, states they do not have any blood work for this patient.

## 2020-05-09 NOTE — ED Notes (Signed)
Opened chart to print AVS at pts request

## 2020-05-09 NOTE — BH Assessment (Signed)
Comprehensive Clinical Assessment (CCA) Note  05/09/2020 Aristotelis Vilardi [id number] Disposition: Clinician discussed patient care with Nira Conn, FNP who said that patient does not meet inpatient care criteria.  Clinician communicated this to Dr. Read Drivers and nurse Glenda Chroman via secure messaging.  Pt is at low risk for suicide. Flowsheet Row ED from 05/08/2020 in [facility] ED from 03/24/2020 in [facility] EMERGENCY DEPARTMENT  C-SSRS RISK CATEGORY Low Risk No Risk     The patient demonstrates the following risk factors for suicide: Chronic risk factors for suicide include: N/A. Acute risk factors for suicide include: unemployment. Protective factors for this patient include: positive social support and hope for the future. Considering these factors, the overall suicide risk at this point appears to be low. Patient is appropriate for outpatient follow up.  Patient does not speak english clearly.  Spanish language interpreter was used.  Interpreter said that patient was speaking english but mumbling a lot.    Patient was brought to [facility] initially. Due to presentation patient was sent to [facility] for medical clearance. Patient says he has no SI, HI or A/V hallucinations Patient says he drinks about 1-2 "small" beers every other day. Pt is not clear on why he was brought to the [facility]. Pt says that his freinds's friends brought him. He does not want SA services.  Pt was taking his metformin every other day.  Not taking medication regularly and does not have a doctor.  Pt does not feel he needs anything now.  Pt says he can call his friends to pick him up.  Pt has fair eye contact.  He smiles appropriately during interview.  Patient does not appear to be responding to internal stimuli.  Pt has stressor of homelessness.  He does not appear to have delusional thinking.    Patient does not have any current outpatient care nor any inpatient care.    Chief Complaint:  Chief  Complaint  Patient presents with  . Medical Clearance   Visit Diagnosis: ETOH use d/o moderate    CCA Screening, Triage and Referral (STR)  Patient Reported Information How did you hear about [place]? Family/Friend (Friend brought him to [facility] initially)  Referral name: Clide Deutscher  Referral phone number: No data recorded  Whom do you see for routine medical problems? I don't have a doctor  Practice/Facility Name: No data recorded Practice/Facility Phone Number: No data recorded Name of Contact: No data recorded Contact Number: No data recorded Contact Fax Number: No data recorded Prescriber Name: No data recorded Prescriber Address (if known): No data recorded  What Is the Reason for Your Visit/Call Today? Friends brought him in to [facility] initially.  Pt smelled of ETOH per [facility] Fransico Michael and NP [employer].  Otherwise patient presented appropriately.  Patient was sent to [facility] for med clearance.  Now that he is med cleared, he denies any SI, HI or A/V hallucinations.  Patient says he dirnks 1=2 "small" beers every other day.  Pt's BAL was <0.  How Long Has This Been Causing You Problems? > than 6 months  What Do You Feel Would Help You the Most Today? Housing Assistance   Have You Recently Been in Any Inpatient Treatment (Hospital/Detox/Crisis Center/28-Day Program)? No  Name/Location of Program/Hospital:No data recorded How Long Were You There? No data recorded When Were You Discharged? No data recorded  Have You Ever Received Services From [facility] Before? No  Who Do You See at [facility]? No  data recorded  Have You Recently Had Any Thoughts About Hurting Yourself? No  Are You Planning to Commit Suicide/Harm Yourself At This time? No   Have you Recently Had Thoughts About Hurting Someone Karolee Ohs? No  Explanation: No data recorded  Have You Used Any Alcohol or Drugs in the Past 24 Hours? Yes  How Long Ago Did You Use Drugs or Alcohol? No data recorded What  Did You Use and How Much? Two "small" beers   Do You Currently Have a Therapist/Psychiatrist? No  Name of Therapist/Psychiatrist: No data recorded  Have You Been Recently Discharged From Any Office Practice or Programs? No  Explanation of Discharge From Practice/Program: No data recorded    CCA Screening Triage Referral Assessment Type of Contact: Tele-Assessment  Is this Initial or Reassessment? Initial Assessment  Date Telepsych consult ordered in [facility]:  05/08/2020  Time Telepsych consult ordered in [facility]:  2347   Patient Reported Information Reviewed? Yes  Patient Left Without Being Seen? No data recorded Reason for Not Completing Assessment: No data recorded  Collateral Involvement: No data recorded  Does Patient Have a Court Appointed Legal Guardian? No data recorded Name and Contact of Legal Guardian: No data recorded If Minor and Not Living with Parent(s), Who has Custody? No data recorded Is CPS involved or ever been involved? No data recorded Is APS involved or ever been involved? Never   Patient Determined To Be At Risk for Harm To Self or Others Based on Review of Patient Reported Information or Presenting Complaint? No  Method: No data recorded Availability of Means: No data recorded Intent: No data recorded Notification Required: No data recorded Additional Information for Danger to Others Potential: No data recorded Additional Comments for Danger to Others Potential: No data recorded Are There Guns or Other Weapons in Your Home? No data recorded Types of Guns/Weapons: No data recorded Are These Weapons Safely Secured?                            No data recorded Who Could Verify You Are Able To Have These Secured: No data recorded Do You Have any Outstanding Charges, Pending Court Dates, Parole/Probation? No data recorded Contacted To Inform of Risk of Harm To Self or Others: No data recorded  Location of Assessment: [facility] ED   Does Patient Present under  Involuntary Commitment? No  IVC Papers Initial File Date: No data recorded  [place] of Residence: [place] (Pt is homeless in [place])   Patient Currently Receiving the Following Services: No data recorded  Determination of Need: Routine (7 days)   Options For Referral: Therapeutic Triage Services (Pt does not meet inpatient care criteria)     CCA Biopsychosocial Intake/Chief Complaint:  Patient was brought to [facility] initially.  Due to presentation patient was sent to [facility] for medical clearance.  Patient says he has no SI, HI or A/V hallucinations Patient says he drinks about 1-2 "small" beers every other day.  Pt is not clear on why he was brought to the [facility].  Pt says that his freinds's friends brought him.  He does not want SA services.  Current Symptoms/Problems: None identified   Patient Reported Schizophrenia/Schizoaffective Diagnosis in Past: No   Strengths: No data recorded Preferences: No data recorded Abilities: No data recorded  Type of Services Patient Feels are Needed: No data recorded  Initial Clinical Notes/Concerns: No data recorded  Mental Health Symptoms Depression:  None   Duration of Depressive  symptoms: No data recorded  Mania:  None   Anxiety:   None   Psychosis:  None   Duration of Psychotic symptoms: No data recorded  Trauma:  None   Obsessions:  None   Compulsions:  None   Inattention:  None   Hyperactivity/Impulsivity:  N/A   Oppositional/Defiant Behaviors:  None   Emotional Irregularity:  None   Other Mood/Personality Symptoms:  No data recorded   Mental Status Exam Appearance and self-care  Stature:  No data recorded  Weight:  No data recorded  Clothing:  No data recorded  Grooming:  No data recorded  Cosmetic use:  No data recorded  Posture/gait:  No data recorded  Motor activity:  No data recorded  Sensorium  Attention:  No data recorded  Concentration:  No data recorded  Orientation:  No data recorded  Recall/memory:   No data recorded  Affect and Mood  Affect:  No data recorded  Mood:  No data recorded  Relating  Eye contact:  No data recorded  Facial expression:  No data recorded  Attitude toward examiner:  Cooperative   Thought and Language  Speech flow: Clear and Coherent   Thought content:  Appropriate to Mood and Circumstances   Preoccupation:  None   Hallucinations:  None   Organization:  No data recorded  [employer] of Knowledge:  No data recorded  Intelligence:  No data recorded  Abstraction:  No data recorded  Judgement:  Normal   Reality Testing:  No data recorded  Insight:  No data recorded  Decision Making:  Normal   Social Functioning  Social Maturity:  No data recorded  Social Judgement:  No data recorded  Stress  Stressors:  Housing   Coping Ability:  No data recorded  Skill Deficits:  No data recorded  Supports:  No data recorded    Religion:    Leisure/Recreation:    Exercise/Diet: Exercise/Diet Do You Have Any Trouble Sleeping?: No   CCA Employment/Education Employment/Work Situation: Employment / Work Situation Employment situation: Unemployed  Education:     CCA Family/Childhood History Family and Relationship History: Family history Marital status: Single  Childhood History:     Child/Adolescent Assessment:     CCA Substance Use Alcohol/Drug Use: Alcohol / Drug Use History of alcohol / drug use?: Yes Withdrawal Symptoms: Other (Comment) (Denies withdrawal symptoms.) Substance #1 Name of Substance 1: ETOH 1 - Age of First Use: unknown 1 - Amount (size/oz): Varies 1 - Frequency: Unclear 1 - Duration: off and on 1 - Last Use / Amount: 03/17 1-2 "small beers" 1 - Method of Aquiring: purchase 1- Route of Use: oral                       ASAM's:  Six Dimensions of Multidimensional Assessment  Dimension 1:  Acute Intoxication and/or Withdrawal Potential:      Dimension 2:  Biomedical Conditions and  Complications:      Dimension 3:  Emotional, Behavioral, or Cognitive Conditions and Complications:     Dimension 4:  Readiness to Change:     Dimension 5:  Relapse, Continued use, or Continued Problem Potential:     Dimension 6:  Recovery/Living Environment:     ASAM Severity Score:    ASAM Recommended Level of Treatment: ASAM Recommended Level of Treatment: Level I Outpatient Treatment (Pt does not feel he needs any help.)   Substance use Disorder (SUD)    Recommendations for Services/Supports/Treatments:  DSM5 Diagnoses: Patient Active Problem List   Diagnosis Date Noted  . Enterococcus faecalis infection 02/27/2020  . E coli infection 02/27/2020  . Klebsiella infection 02/27/2020  . Anaerobic bacterial infection 02/27/2020  . Severe sepsis (HCC) 02/21/2020  . Vomiting 02/21/2020  . Diarrhea 02/21/2020  . Lactic acidosis 02/21/2020  . Reflux esophagitis 02/21/2020  . Leukopenia 02/21/2020  . Hypokalemia 02/21/2020  . Altered mental status 02/21/2020  . Osteomyelitis of finger (HCC)   . Wound infection 01/30/2020  . Type 2 diabetes mellitus (HCC) 01/30/2020  . Cellulitis of finger of right hand 01/18/2020  . Closed fracture dislocation of proximal interphalangeal (PIP) joint of finger 01/18/2020  . Hyperglycemia due to diabetes mellitus (HCC) 01/18/2020  . Essential hypertension 01/18/2020  . Alcohol abuse 01/18/2020  . Seizure [facility](HCC)     Patient Centered Plan: Patient is on the following Treatment Plan(s):  Substance Abuse   Referrals to Alternative Service(s): Referred to Alternative Service(s):   Place:   Date:   Time:    Referred to Alternative Service(s):   Place:   Date:   Time:    Referred to Alternative Service(s):   Place:   Date:   Time:    Referred to Alternative Service(s):   Place:   Date:   Time:     Wandra MannanHarvey, Branda Chaudhary Ray, LCAS

## 2020-05-09 NOTE — ED Notes (Signed)
Per night shift RN- Pts daughter cannot come to pick him up until 1200. Pt is ambulatory. Pt is alert and oriented. Per daughter: pt can wait in lobby until she is able to come get him.

## 2020-05-09 NOTE — ED Notes (Signed)
Attempted to call daughter for patient for ride home but no answer.

## 2020-06-02 ENCOUNTER — Telehealth: Payer: Self-pay

## 2020-06-02 NOTE — Telephone Encounter (Signed)
Copied from CRM [id number]. Topic: General - Other >> Jun 02, 2020  9:58 AM Jaquita Rector A wrote: Reason for CRM: Patient daughter Felizardo Hoffmann called in to inquire if Gwinda Passe can please write a letter stating that patient is ok to travel on an airplane. He is being sent back to [place] to live and they need clearance for this please also per daughter this is quite time sensitive. Please send to  [email or web address] or call Hinda Kehr to pick up at the office. Ph# [phone]

## 2020-06-03 NOTE — Telephone Encounter (Signed)
Pts daughter called back to check the status of this request, caller stated it was urgent. Please advise.

## 2020-06-04 NOTE — Telephone Encounter (Signed)
Sent to PCP ?

## 2020-06-04 NOTE — Telephone Encounter (Signed)
Pt, daughter has called back in, stated would make an appt if can get in immediately but this must be done asap, called office to see what kind of appt as pt never seen in office, only virtual [facility] office staff states needs CPE. Pt daughter states no ins, no Medicaid, [place]. No money for CPE. Offered financial assistance with [facility] but pt was going to see if she can find a cheaper alternative. Will call back tomorrow after researching.

## 2020-06-07 NOTE — Telephone Encounter (Signed)
I can not make a decision if patient is stable enough to fly. Never seen in person

## 2020-07-09 ENCOUNTER — Inpatient Hospital Stay (INDEPENDENT_AMBULATORY_CARE_PROVIDER_SITE_OTHER): Payer: Self-pay | Admitting: Primary Care

## 2021-08-14 IMAGING — DX DG HAND COMPLETE 3+V*R*
1 series · 3 of 3 positions shown · non-contrast
Comparison: 01/18/2020

CLINICAL DATA: Wound infection.

EXAM:
RIGHT HAND - COMPLETE 3+ VIEW

[Series 1: hand · 0.14mm/px · 3 of 3 slices shown]
[im 1/3]
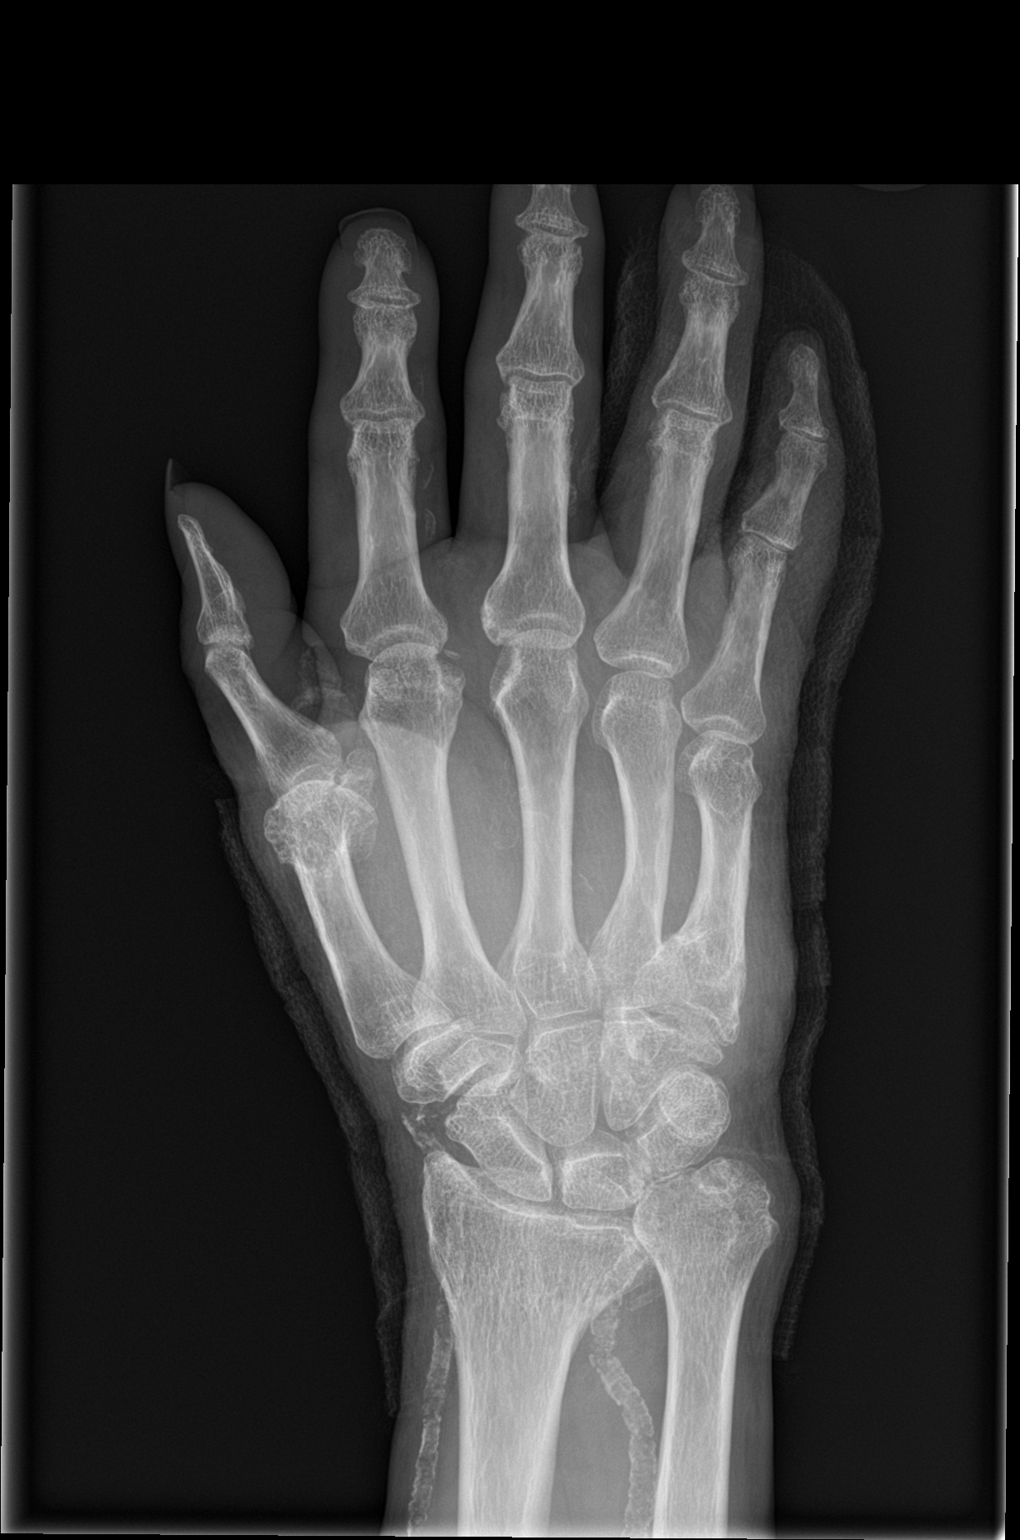
[im 2/3]
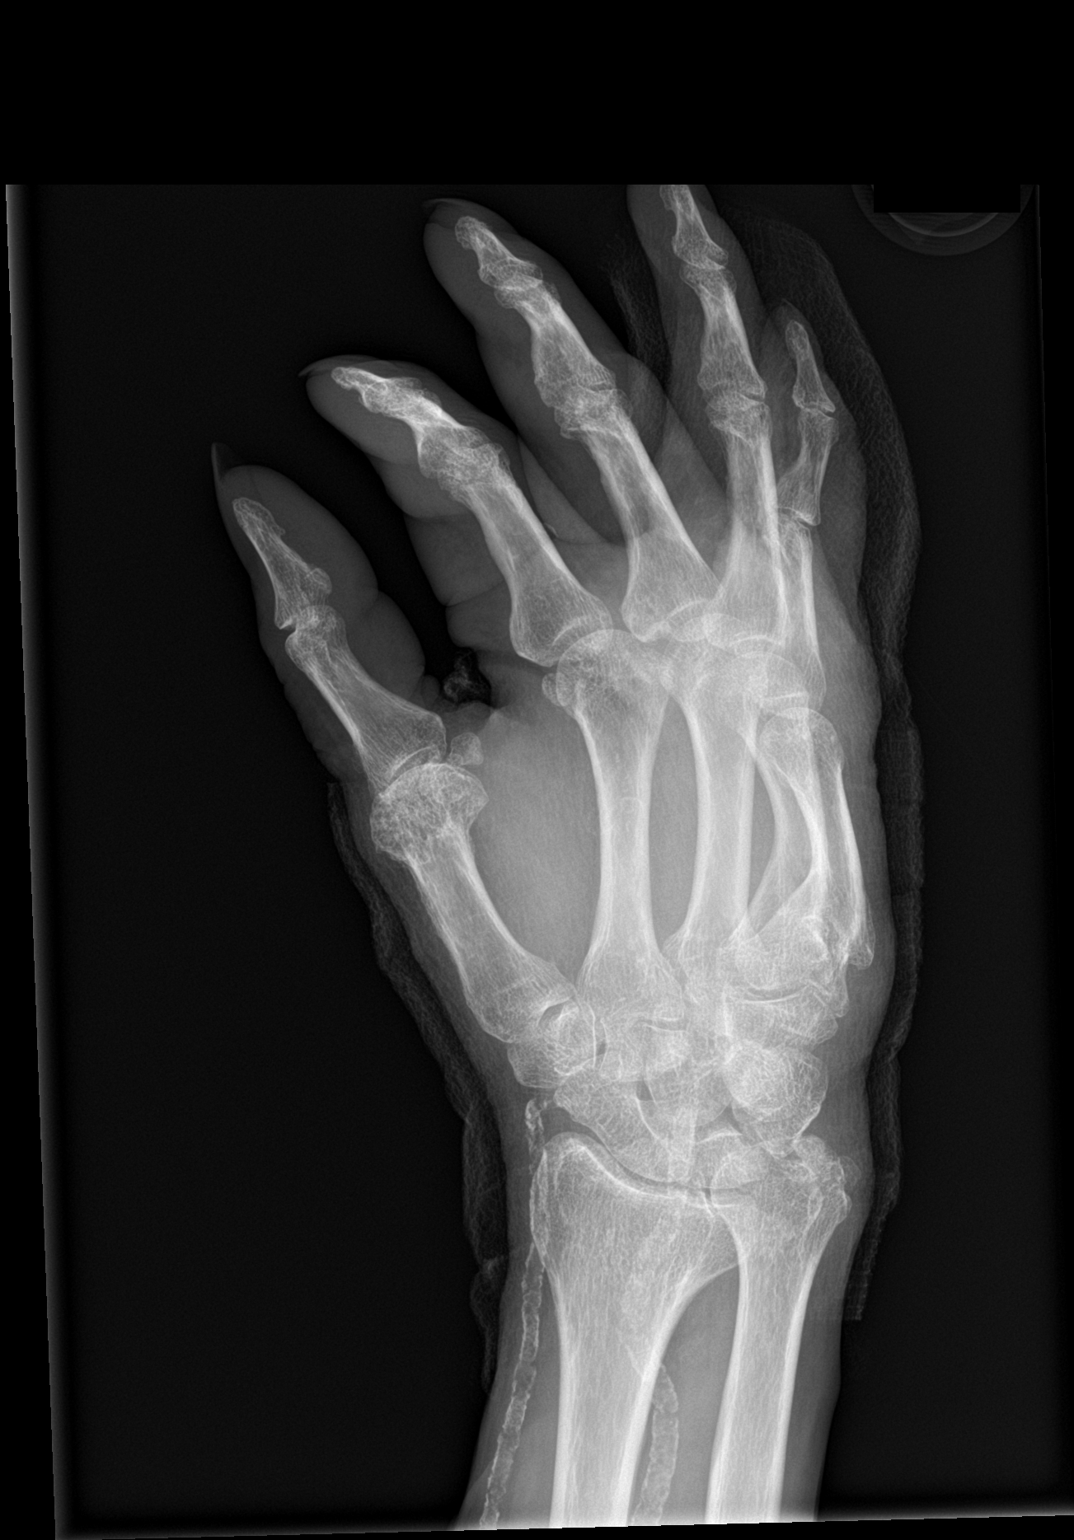
[im 3/3]
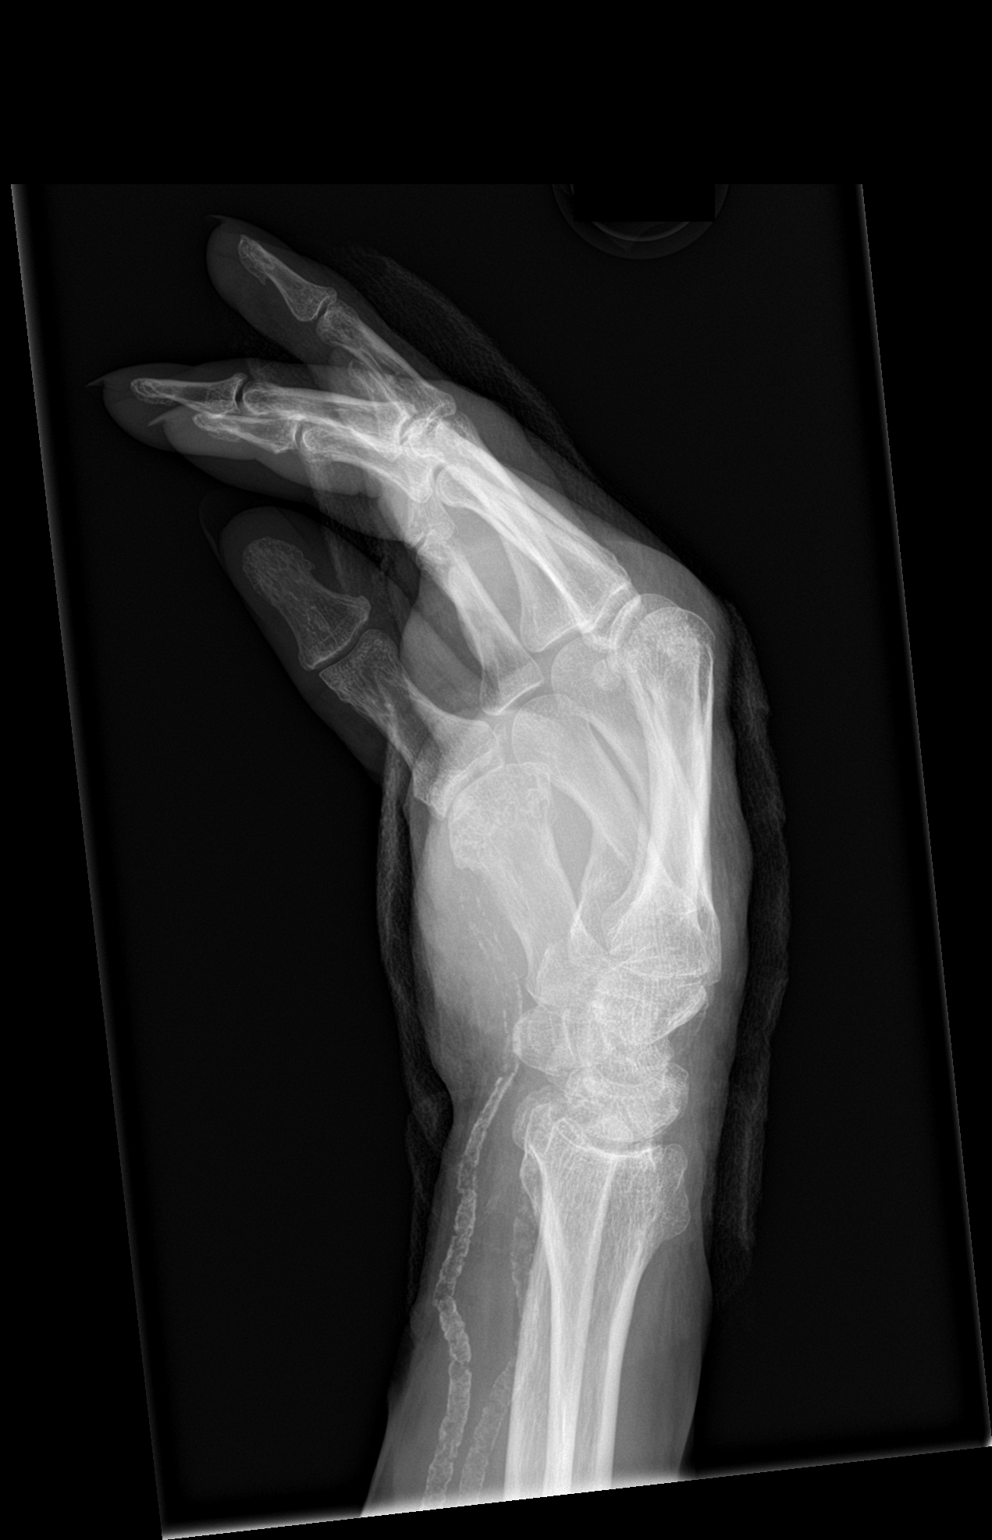

[3 of 3 positions shown; findings below may reference images not displayed]

FINDINGS: The fifth digit PIP joint remains located. No acute fracture. Remote
distal radius fracture with settling and ulnar positive variance.
Remote boxer's fracture with healed angulation. Interphalangeal and
MCP osteoarthritis. Extensive arterial calcification. No evidence of
osteomyelitis.
IMPRESSION: 1. No acute finding.
2. Chronic findings are described above.

## 2021-10-07 IMAGING — CT CT HEAD W/O CM
2 of 4 series · 12 of 47 positions shown, 15 images · non-contrast
Comparison: Head CT 02/21/2020

CLINICAL DATA: Altered mental status. Recent fall, patient denies
striking head.

EXAM:
CT HEAD WITHOUT CONTRAST
TECHNIQUE: Contiguous axial images were obtained from the base of the skull
through the vertex without intravenous contrast.

[Series 5: head ax w o · axial · 0.31mm/px · z∈[-26,+120]mm · 9 of 36 slices shown, 12 images]
[im 3/36  brain]
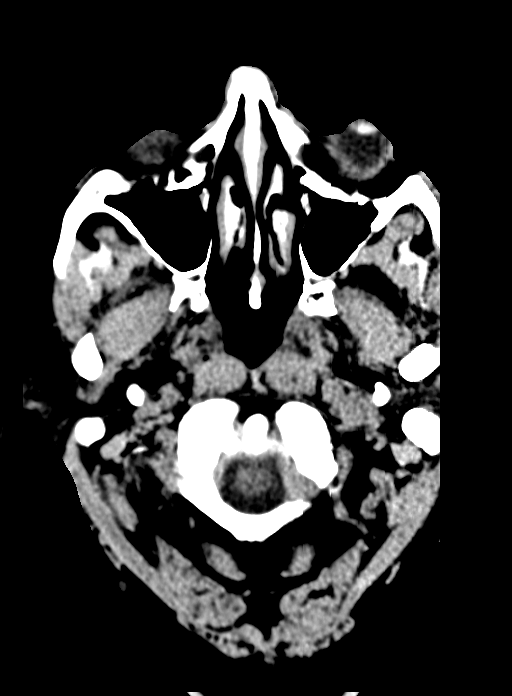
[im 3/36  bone]
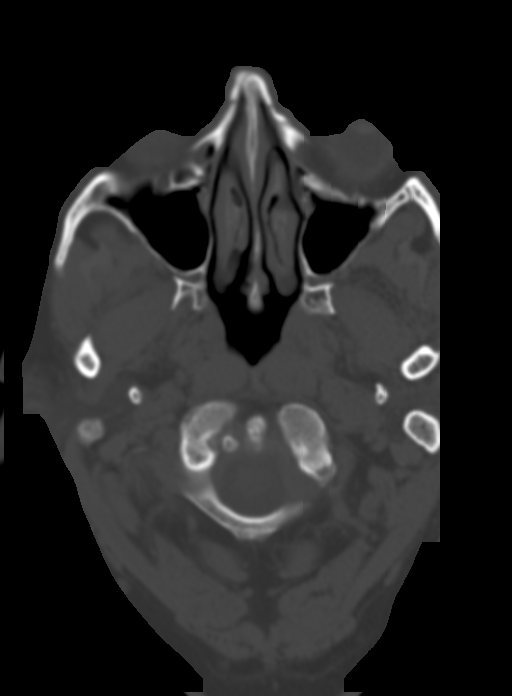
[im 8/36  brain]
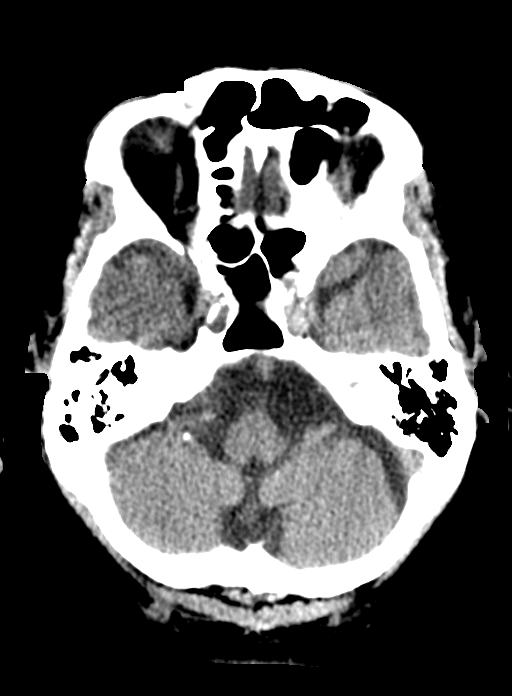
[im 10/36  brain]
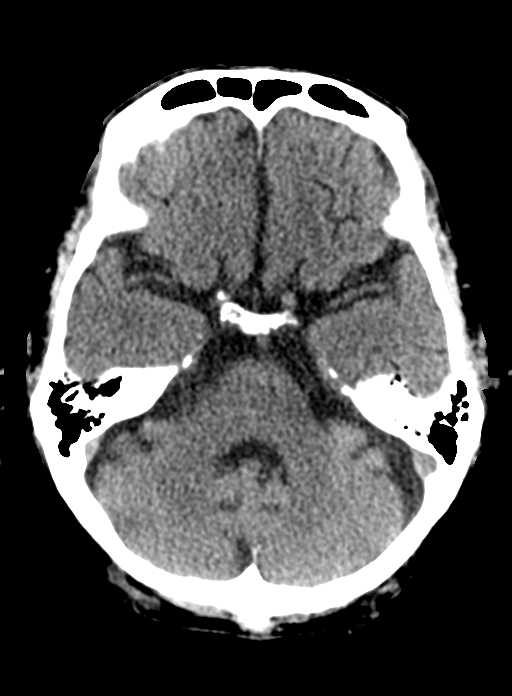
[im 15/36  brain]
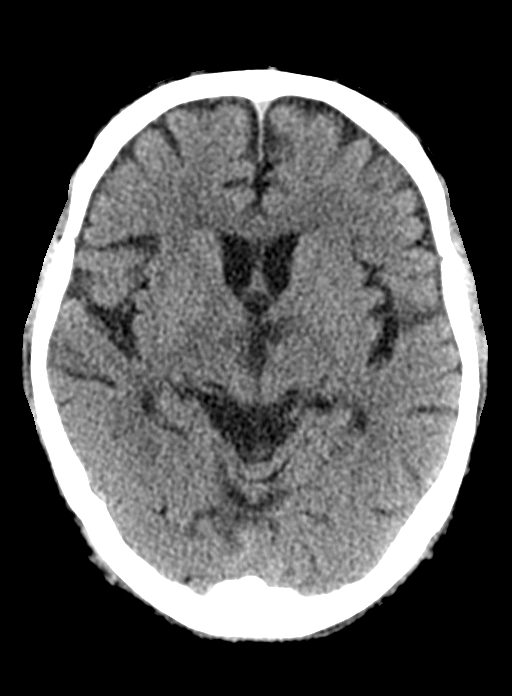
[im 19/36  brain]
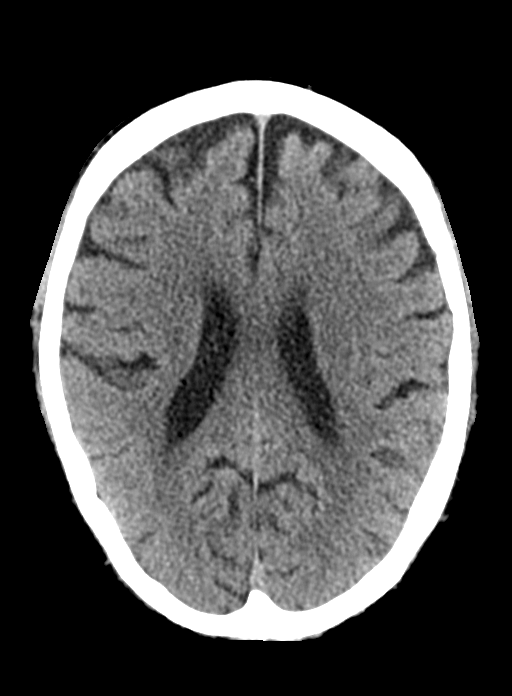
[im 19/36  bone]
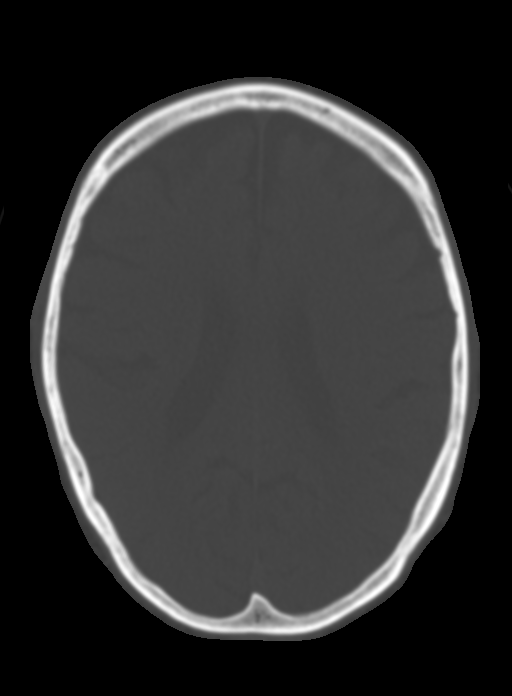
[im 22/36  brain]
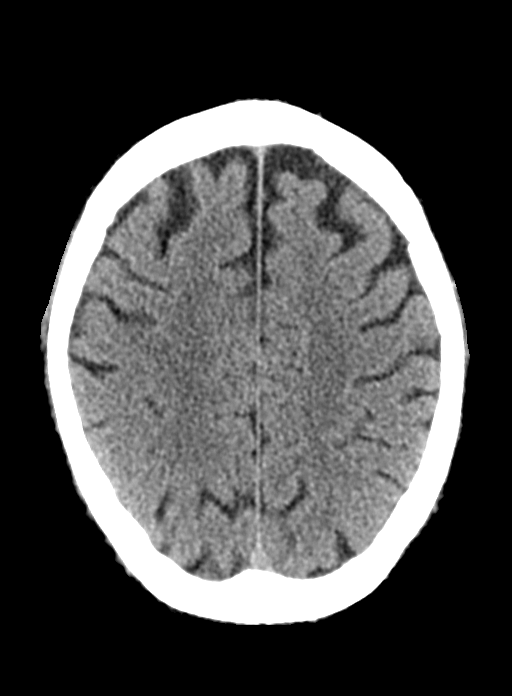
[im 26/36  brain]
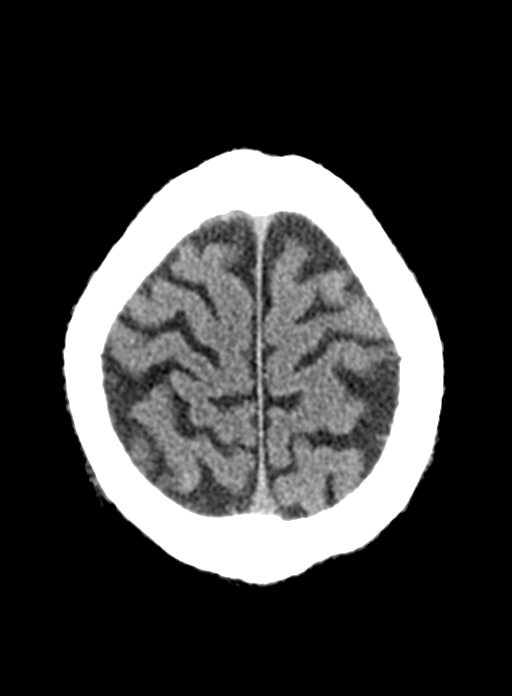
[im 29/36  brain]
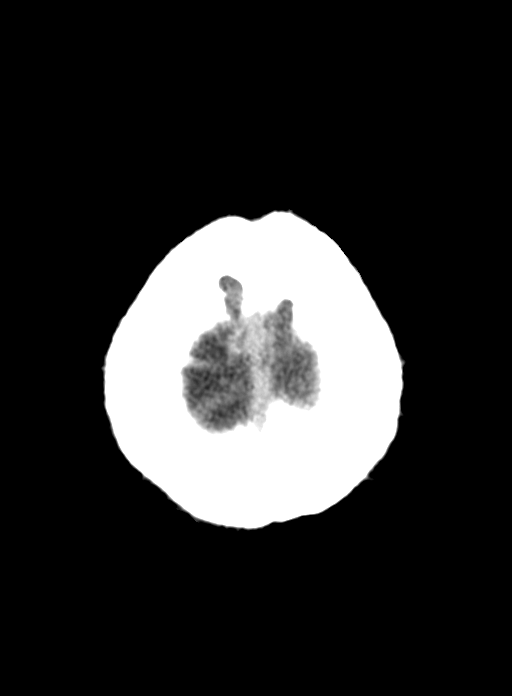
[im 33/36  brain]
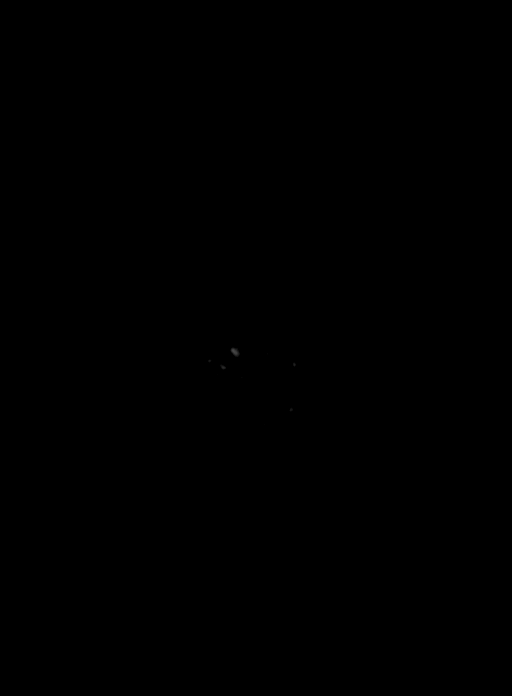
[im 33/36  bone]
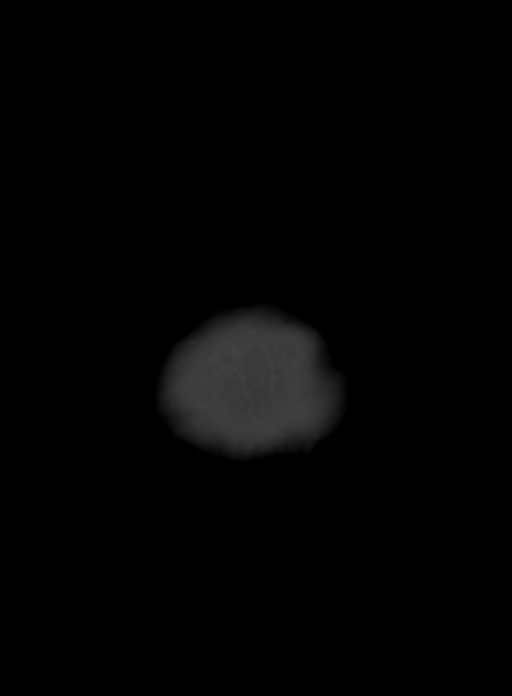

[Series 6: coronal soft · coronal · 0.36mm/px · 3 of 84 slices shown]
[im 28/84  brain]
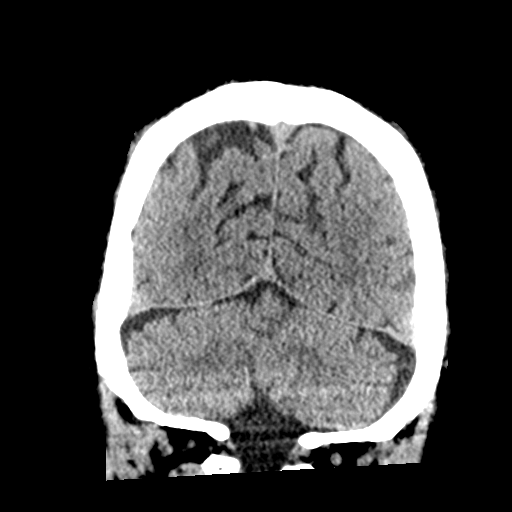
[im 37/84  brain]
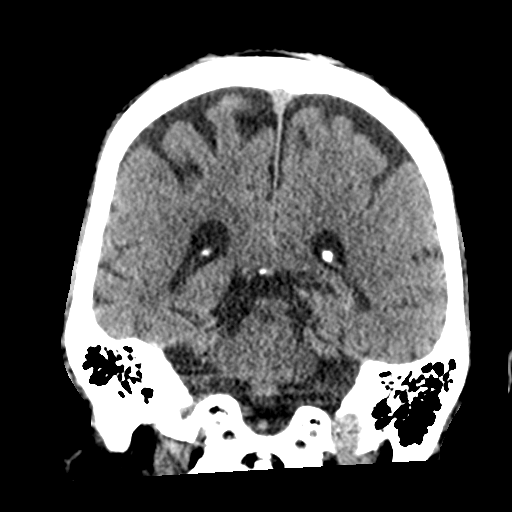
[im 47/84  brain]
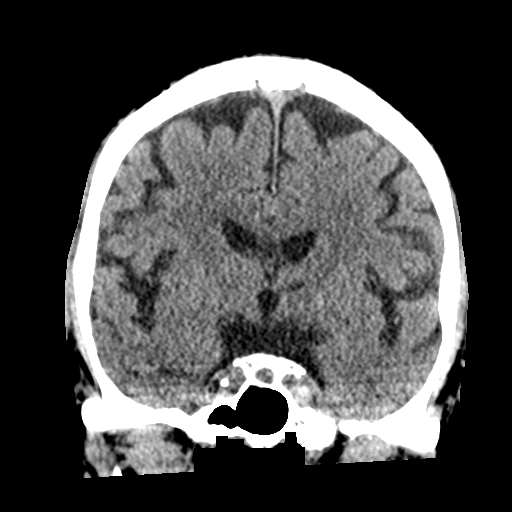

[12 of 47 positions shown; findings below may reference images not displayed]

FINDINGS: Brain: No intracranial hemorrhage, mass effect, or midline shift. No
hydrocephalus. The basilar cisterns are patent. Stable degree of
atrophy and chronic small vessel ischemia. Remote lacunar infarct in
the left greater than right thalamus. No evidence of territorial
infarct or acute ischemia. No extra-axial or intracranial fluid
collection.

Vascular: Atherosclerosis of skullbase vasculature without
hyperdense vessel or abnormal calcification.

Skull: No fracture or focal lesion.

Sinuses/Orbits: No acute findings. Chronic sclerosis and
opacification of lower right mastoid air cells. Paranasal sinuses
are well aerated

Other: None.
IMPRESSION: 1. No acute intracranial abnormality.
2. Stable atrophy and chronic small vessel ischemia. Remote lacunar
infarcts in the thalami.
